# Patient Record
Sex: Female | Born: 1935 | Race: White | Hispanic: No | Smoking: Former smoker
Health system: Southern US, Community
[De-identification: ages and names within clinical notes are randomized; demographics above are authoritative.]

## PROBLEM LIST (undated history)

## (undated) DIAGNOSIS — K219 Gastro-esophageal reflux disease without esophagitis: Secondary | ICD-10-CM

## (undated) DIAGNOSIS — Z974 Presence of external hearing-aid: Secondary | ICD-10-CM

## (undated) DIAGNOSIS — I10 Essential (primary) hypertension: Secondary | ICD-10-CM

## (undated) DIAGNOSIS — I2510 Atherosclerotic heart disease of native coronary artery without angina pectoris: Secondary | ICD-10-CM

## (undated) DIAGNOSIS — M549 Dorsalgia, unspecified: Secondary | ICD-10-CM

## (undated) DIAGNOSIS — E7800 Pure hypercholesterolemia, unspecified: Secondary | ICD-10-CM

## (undated) DIAGNOSIS — I509 Heart failure, unspecified: Secondary | ICD-10-CM

## (undated) DIAGNOSIS — G459 Transient cerebral ischemic attack, unspecified: Secondary | ICD-10-CM

## (undated) HISTORY — PX: CHOLECYSTECTOMY: SHX55

## (undated) HISTORY — PX: APPENDECTOMY: SHX54

## (undated) HISTORY — PX: TONSILLECTOMY: SUR1361

## (undated) HISTORY — PX: BREAST CYST ASPIRATION: SHX578

---

## 1993-01-13 HISTORY — PX: OTHER SURGICAL HISTORY: SHX169

## 1996-01-14 HISTORY — PX: ABDOMINAL HYSTERECTOMY: SHX81

## 1997-11-15 ENCOUNTER — Ambulatory Visit (HOSPITAL_COMMUNITY): Admission: RE | Admit: 1997-11-15 | Discharge: 1997-11-15 | Payer: Self-pay | Admitting: Internal Medicine

## 1999-04-23 ENCOUNTER — Encounter: Payer: Self-pay | Admitting: Internal Medicine

## 1999-04-23 ENCOUNTER — Encounter: Admission: RE | Admit: 1999-04-23 | Discharge: 1999-04-23 | Payer: Self-pay | Admitting: Internal Medicine

## 1999-05-07 ENCOUNTER — Ambulatory Visit (HOSPITAL_COMMUNITY): Admission: RE | Admit: 1999-05-07 | Discharge: 1999-05-07 | Payer: Self-pay | Admitting: Internal Medicine

## 1999-07-18 ENCOUNTER — Ambulatory Visit (HOSPITAL_COMMUNITY): Admission: RE | Admit: 1999-07-18 | Discharge: 1999-07-18 | Payer: Self-pay | Admitting: Gastroenterology

## 1999-08-14 ENCOUNTER — Ambulatory Visit (HOSPITAL_COMMUNITY): Admission: RE | Admit: 1999-08-14 | Discharge: 1999-08-14 | Payer: Self-pay | Admitting: Gastroenterology

## 1999-08-14 ENCOUNTER — Encounter: Payer: Self-pay | Admitting: Gastroenterology

## 2000-04-27 ENCOUNTER — Encounter: Payer: Self-pay | Admitting: Internal Medicine

## 2000-04-27 ENCOUNTER — Encounter: Admission: RE | Admit: 2000-04-27 | Discharge: 2000-04-27 | Payer: Self-pay | Admitting: Internal Medicine

## 2000-12-01 ENCOUNTER — Ambulatory Visit (HOSPITAL_COMMUNITY): Admission: RE | Admit: 2000-12-01 | Discharge: 2000-12-01 | Payer: Self-pay | Admitting: Internal Medicine

## 2001-04-28 ENCOUNTER — Encounter: Payer: Self-pay | Admitting: Internal Medicine

## 2001-04-28 ENCOUNTER — Encounter: Admission: RE | Admit: 2001-04-28 | Discharge: 2001-04-28 | Payer: Self-pay | Admitting: Internal Medicine

## 2002-05-03 ENCOUNTER — Encounter: Admission: RE | Admit: 2002-05-03 | Discharge: 2002-05-03 | Payer: Self-pay | Admitting: Internal Medicine

## 2002-05-03 ENCOUNTER — Encounter: Payer: Self-pay | Admitting: Internal Medicine

## 2003-05-04 ENCOUNTER — Encounter: Admission: RE | Admit: 2003-05-04 | Discharge: 2003-05-04 | Payer: Self-pay | Admitting: Internal Medicine

## 2003-08-19 ENCOUNTER — Emergency Department (HOSPITAL_COMMUNITY): Admission: EM | Admit: 2003-08-19 | Discharge: 2003-08-19 | Payer: Self-pay | Admitting: Emergency Medicine

## 2003-08-28 ENCOUNTER — Emergency Department (HOSPITAL_COMMUNITY): Admission: EM | Admit: 2003-08-28 | Discharge: 2003-08-28 | Payer: Self-pay | Admitting: Emergency Medicine

## 2003-08-31 ENCOUNTER — Emergency Department (HOSPITAL_COMMUNITY): Admission: EM | Admit: 2003-08-31 | Discharge: 2003-08-31 | Payer: Self-pay | Admitting: Emergency Medicine

## 2003-09-08 ENCOUNTER — Encounter: Admission: RE | Admit: 2003-09-08 | Discharge: 2003-09-08 | Payer: Self-pay | Admitting: Otolaryngology

## 2003-09-19 ENCOUNTER — Other Ambulatory Visit: Admission: RE | Admit: 2003-09-19 | Discharge: 2003-09-19 | Payer: Self-pay | Admitting: Otolaryngology

## 2004-05-07 ENCOUNTER — Encounter: Admission: RE | Admit: 2004-05-07 | Discharge: 2004-05-07 | Payer: Self-pay | Admitting: Internal Medicine

## 2004-08-13 ENCOUNTER — Encounter: Admission: RE | Admit: 2004-08-13 | Discharge: 2004-08-13 | Payer: Self-pay | Admitting: Internal Medicine

## 2005-05-13 ENCOUNTER — Encounter: Admission: RE | Admit: 2005-05-13 | Discharge: 2005-05-13 | Payer: Self-pay | Admitting: Internal Medicine

## 2005-08-20 ENCOUNTER — Ambulatory Visit (HOSPITAL_COMMUNITY): Admission: RE | Admit: 2005-08-20 | Discharge: 2005-08-20 | Payer: Self-pay | Admitting: Gastroenterology

## 2006-06-11 ENCOUNTER — Ambulatory Visit: Payer: Self-pay | Admitting: Internal Medicine

## 2007-05-04 ENCOUNTER — Ambulatory Visit: Payer: Self-pay | Admitting: Internal Medicine

## 2007-06-14 ENCOUNTER — Ambulatory Visit: Payer: Self-pay | Admitting: Internal Medicine

## 2007-07-07 ENCOUNTER — Ambulatory Visit: Payer: Self-pay | Admitting: Internal Medicine

## 2008-06-14 ENCOUNTER — Ambulatory Visit: Payer: Self-pay | Admitting: Internal Medicine

## 2008-10-26 ENCOUNTER — Emergency Department: Payer: Self-pay | Admitting: Emergency Medicine

## 2009-01-05 ENCOUNTER — Emergency Department: Payer: Self-pay | Admitting: Emergency Medicine

## 2009-06-20 ENCOUNTER — Ambulatory Visit: Payer: Self-pay | Admitting: Internal Medicine

## 2010-02-13 ENCOUNTER — Ambulatory Visit: Payer: Self-pay | Admitting: Internal Medicine

## 2010-02-15 HISTORY — PX: CORONARY ARTERY BYPASS GRAFT: SHX141

## 2010-02-22 ENCOUNTER — Encounter: Payer: Self-pay | Admitting: Internal Medicine

## 2010-03-14 ENCOUNTER — Encounter: Payer: Self-pay | Admitting: Internal Medicine

## 2010-04-08 ENCOUNTER — Encounter: Payer: Self-pay | Admitting: Internal Medicine

## 2010-04-14 ENCOUNTER — Encounter: Payer: Self-pay | Admitting: Internal Medicine

## 2010-05-14 ENCOUNTER — Encounter: Payer: Self-pay | Admitting: Internal Medicine

## 2010-06-14 ENCOUNTER — Encounter: Payer: Self-pay | Admitting: Internal Medicine

## 2010-08-14 ENCOUNTER — Ambulatory Visit: Payer: Self-pay | Admitting: Internal Medicine

## 2011-01-16 ENCOUNTER — Emergency Department: Payer: Self-pay | Admitting: Emergency Medicine

## 2011-01-16 LAB — BASIC METABOLIC PANEL
Chloride: 107 mmol/L (ref 98–107)
Co2: 24 mmol/L (ref 21–32)
Creatinine: 0.86 mg/dL (ref 0.60–1.30)
Glucose: 137 mg/dL — ABNORMAL HIGH (ref 65–99)
Potassium: 3.8 mmol/L (ref 3.5–5.1)

## 2011-01-16 LAB — CBC
HCT: 38.7 % (ref 35.0–47.0)
MCH: 31.5 pg (ref 26.0–34.0)
MCHC: 33.5 g/dL (ref 32.0–36.0)
RDW: 15.2 % — ABNORMAL HIGH (ref 11.5–14.5)

## 2011-01-16 LAB — URINALYSIS, COMPLETE
Blood: NEGATIVE
Nitrite: NEGATIVE
Ph: 6 (ref 4.5–8.0)
Protein: NEGATIVE
Specific Gravity: 1.004 (ref 1.003–1.030)

## 2011-01-16 LAB — TROPONIN I
Troponin-I: <0.02 ng/mL
Troponin-I: <0.02 ng/mL

## 2011-06-11 ENCOUNTER — Ambulatory Visit: Payer: Self-pay | Admitting: Unknown Physician Specialty

## 2011-09-01 ENCOUNTER — Ambulatory Visit: Payer: Self-pay | Admitting: Otolaryngology

## 2011-10-14 ENCOUNTER — Ambulatory Visit: Payer: Self-pay | Admitting: Internal Medicine

## 2012-10-22 ENCOUNTER — Ambulatory Visit: Payer: Self-pay | Admitting: Internal Medicine

## 2013-03-23 ENCOUNTER — Emergency Department: Payer: Self-pay | Admitting: Emergency Medicine

## 2013-03-23 LAB — COMPREHENSIVE METABOLIC PANEL
ALBUMIN: 3.8 g/dL (ref 3.4–5.0)
ALK PHOS: 79 U/L
ANION GAP: 7 (ref 7–16)
AST: 30 U/L (ref 15–37)
BUN: 16 mg/dL (ref 7–18)
Bilirubin,Total: 1.2 mg/dL — ABNORMAL HIGH (ref 0.2–1.0)
CALCIUM: 9.4 mg/dL (ref 8.5–10.1)
CREATININE: 1.04 mg/dL (ref 0.60–1.30)
Chloride: 106 mmol/L (ref 98–107)
Co2: 25 mmol/L (ref 21–32)
EGFR (African American): 57 — ABNORMAL LOW
GFR CALC NON AF AMER: 49 — AB
GLUCOSE: 197 mg/dL — AB (ref 65–99)
Osmolality: 282 (ref 275–301)
POTASSIUM: 4 mmol/L (ref 3.5–5.1)
SGPT (ALT): 28 U/L (ref 12–78)
Sodium: 138 mmol/L (ref 136–145)
Total Protein: 7.1 g/dL (ref 6.4–8.2)

## 2013-03-23 LAB — CBC
HCT: 40.3 % (ref 35.0–47.0)
HGB: 13.7 g/dL (ref 12.0–16.0)
MCH: 31.6 pg (ref 26.0–34.0)
MCHC: 34.1 g/dL (ref 32.0–36.0)
MCV: 93 fL (ref 80–100)
Platelet: 234 10*3/uL (ref 150–440)
RBC: 4.34 10*6/uL (ref 3.80–5.20)
RDW: 14.3 % (ref 11.5–14.5)
WBC: 7.8 10*3/uL (ref 3.6–11.0)

## 2013-03-23 LAB — CK TOTAL AND CKMB (NOT AT ARMC)
CK, TOTAL: 100 U/L
CK-MB: 2.6 ng/mL (ref 0.5–3.6)

## 2013-03-23 LAB — TROPONIN I: Troponin-I: <0.02 ng/mL

## 2013-03-23 LAB — URINALYSIS, COMPLETE
Bilirubin,UR: NEGATIVE
Blood: NEGATIVE
GLUCOSE, UR: NEGATIVE mg/dL (ref 0–75)
Hyaline Cast: 6
LEUKOCYTE ESTERASE: NEGATIVE
NITRITE: NEGATIVE
PH: 7 (ref 4.5–8.0)
PROTEIN: NEGATIVE
RBC,UR: 2 /HPF (ref 0–5)
SPECIFIC GRAVITY: 1.015 (ref 1.003–1.030)
WBC UR: 3 /HPF (ref 0–5)

## 2013-03-28 LAB — CULTURE, BLOOD (SINGLE)

## 2013-03-29 DIAGNOSIS — I2510 Atherosclerotic heart disease of native coronary artery without angina pectoris: Secondary | ICD-10-CM | POA: Insufficient documentation

## 2013-03-29 DIAGNOSIS — E782 Mixed hyperlipidemia: Secondary | ICD-10-CM | POA: Insufficient documentation

## 2013-09-19 ENCOUNTER — Emergency Department: Payer: Self-pay | Admitting: Emergency Medicine

## 2013-09-19 LAB — CBC WITH DIFFERENTIAL/PLATELET
BASOS ABS: 0.3 10*3/uL — AB (ref 0.0–0.1)
Basophil %: 3.2 %
EOS ABS: 0.1 10*3/uL (ref 0.0–0.7)
EOS PCT: 0.7 %
HCT: 41.9 % (ref 35.0–47.0)
HGB: 13.7 g/dL (ref 12.0–16.0)
Lymphocyte #: 0.8 10*3/uL — ABNORMAL LOW (ref 1.0–3.6)
Lymphocyte %: 7.4 %
MCH: 30.6 pg (ref 26.0–34.0)
MCHC: 32.7 g/dL (ref 32.0–36.0)
MCV: 94 fL (ref 80–100)
MONO ABS: 0.5 x10 3/mm (ref 0.2–0.9)
Monocyte %: 5 %
NEUTROS ABS: 8.8 10*3/uL — AB (ref 1.4–6.5)
NEUTROS PCT: 83.7 %
Platelet: 224 10*3/uL (ref 150–440)
RBC: 4.48 10*6/uL (ref 3.80–5.20)
RDW: 13.9 % (ref 11.5–14.5)
WBC: 10.5 10*3/uL (ref 3.6–11.0)

## 2013-09-19 LAB — URINALYSIS, COMPLETE
BLOOD: NEGATIVE
Bilirubin,UR: NEGATIVE
GLUCOSE, UR: NEGATIVE mg/dL (ref 0–75)
KETONE: NEGATIVE
Leukocyte Esterase: NEGATIVE
Nitrite: NEGATIVE
PROTEIN: NEGATIVE
Ph: 5 (ref 4.5–8.0)
RBC,UR: 1 /HPF (ref 0–5)
SPECIFIC GRAVITY: 1.014 (ref 1.003–1.030)
Squamous Epithelial: 1
WBC UR: 1 /HPF (ref 0–5)

## 2013-09-19 LAB — COMPREHENSIVE METABOLIC PANEL
ALBUMIN: 3.7 g/dL (ref 3.4–5.0)
ALK PHOS: 79 U/L
ANION GAP: 6 — AB (ref 7–16)
BUN: 19 mg/dL — AB (ref 7–18)
Bilirubin,Total: 1.2 mg/dL — ABNORMAL HIGH (ref 0.2–1.0)
CALCIUM: 9.8 mg/dL (ref 8.5–10.1)
CHLORIDE: 106 mmol/L (ref 98–107)
CO2: 24 mmol/L (ref 21–32)
CREATININE: 0.85 mg/dL (ref 0.60–1.30)
EGFR (African American): >60
EGFR (Non-African Amer.): >60
Glucose: 189 mg/dL — ABNORMAL HIGH (ref 65–99)
Osmolality: 279 (ref 275–301)
POTASSIUM: 3.9 mmol/L (ref 3.5–5.1)
SGOT(AST): 16 U/L (ref 15–37)
SGPT (ALT): 23 U/L
Sodium: 136 mmol/L (ref 136–145)
TOTAL PROTEIN: 7 g/dL (ref 6.4–8.2)

## 2013-09-19 LAB — CLOSTRIDIUM DIFFICILE(ARMC)

## 2013-09-22 LAB — STOOL CULTURE

## 2013-11-16 ENCOUNTER — Ambulatory Visit: Payer: Self-pay | Admitting: Internal Medicine

## 2013-12-05 DIAGNOSIS — I340 Nonrheumatic mitral (valve) insufficiency: Secondary | ICD-10-CM | POA: Insufficient documentation

## 2013-12-05 DIAGNOSIS — I5032 Chronic diastolic (congestive) heart failure: Secondary | ICD-10-CM | POA: Insufficient documentation

## 2013-12-05 DIAGNOSIS — I5022 Chronic systolic (congestive) heart failure: Secondary | ICD-10-CM | POA: Insufficient documentation

## 2014-03-09 DIAGNOSIS — R1084 Generalized abdominal pain: Secondary | ICD-10-CM | POA: Insufficient documentation

## 2014-05-02 ENCOUNTER — Emergency Department
Admit: 2014-05-02 | Disposition: A | Discharge status: Home / Self Care | Payer: Self-pay | Admitting: Emergency Medicine

## 2014-05-02 LAB — COMPREHENSIVE METABOLIC PANEL
ALT: 18 U/L
AST: 23 U/L
Albumin: 4.2 g/dL
Alkaline Phosphatase: 72 U/L
Anion Gap: 12 (ref 7–16)
BUN: 16 mg/dL
Bilirubin,Total: 0.7 mg/dL
CALCIUM: 9.6 mg/dL
CO2: 21 mmol/L — AB
Chloride: 106 mmol/L
Creatinine: 0.82 mg/dL
Glucose: 141 mg/dL — ABNORMAL HIGH
Potassium: 3.7 mmol/L
Sodium: 139 mmol/L
TOTAL PROTEIN: 6.7 g/dL

## 2014-05-02 LAB — CBC WITH DIFFERENTIAL/PLATELET
BASOS PCT: 0.5 %
Basophil #: 0 10*3/uL (ref 0.0–0.1)
EOS ABS: 0.2 10*3/uL (ref 0.0–0.7)
EOS PCT: 2.4 %
HCT: 39 % (ref 35.0–47.0)
HGB: 13.2 g/dL (ref 12.0–16.0)
LYMPHS PCT: 36.5 %
Lymphocyte #: 2.8 10*3/uL (ref 1.0–3.6)
MCH: 31.2 pg (ref 26.0–34.0)
MCHC: 33.8 g/dL (ref 32.0–36.0)
MCV: 92 fL (ref 80–100)
MONOS PCT: 6.7 %
Monocyte #: 0.5 x10 3/mm (ref 0.2–0.9)
NEUTROS PCT: 53.9 %
Neutrophil #: 4.1 10*3/uL (ref 1.4–6.5)
Platelet: 210 10*3/uL (ref 150–440)
RBC: 4.22 10*6/uL (ref 3.80–5.20)
RDW: 13.8 % (ref 11.5–14.5)
WBC: 7.6 10*3/uL (ref 3.6–11.0)

## 2014-05-02 LAB — TSH: Thyroid Stimulating Horm: 6.329 u[IU]/mL — ABNORMAL HIGH

## 2014-05-02 LAB — TROPONIN I: Troponin-I: <0.03 ng/mL

## 2014-06-07 DIAGNOSIS — I071 Rheumatic tricuspid insufficiency: Secondary | ICD-10-CM | POA: Insufficient documentation

## 2014-06-08 ENCOUNTER — Other Ambulatory Visit: Payer: Self-pay | Admitting: Gastroenterology

## 2014-06-08 DIAGNOSIS — R1084 Generalized abdominal pain: Secondary | ICD-10-CM

## 2014-06-15 ENCOUNTER — Ambulatory Visit
Admission: RE | Admit: 2014-06-15 | Discharge: 2014-06-15 | Disposition: A | Discharge status: Home / Self Care | Payer: Medicare Other | Source: Ambulatory Visit | Attending: Gastroenterology | Admitting: Gastroenterology

## 2014-06-15 DIAGNOSIS — R140 Abdominal distension (gaseous): Secondary | ICD-10-CM | POA: Insufficient documentation

## 2014-06-15 DIAGNOSIS — I700 Atherosclerosis of aorta: Secondary | ICD-10-CM | POA: Insufficient documentation

## 2014-06-15 DIAGNOSIS — R1084 Generalized abdominal pain: Secondary | ICD-10-CM | POA: Diagnosis not present

## 2014-06-16 DIAGNOSIS — I10 Essential (primary) hypertension: Secondary | ICD-10-CM | POA: Insufficient documentation

## 2014-09-08 DIAGNOSIS — K219 Gastro-esophageal reflux disease without esophagitis: Secondary | ICD-10-CM | POA: Insufficient documentation

## 2014-09-08 DIAGNOSIS — Z8639 Personal history of other endocrine, nutritional and metabolic disease: Secondary | ICD-10-CM | POA: Insufficient documentation

## 2014-09-12 DIAGNOSIS — M5416 Radiculopathy, lumbar region: Secondary | ICD-10-CM | POA: Insufficient documentation

## 2014-09-12 DIAGNOSIS — M5136 Other intervertebral disc degeneration, lumbar region: Secondary | ICD-10-CM | POA: Insufficient documentation

## 2014-09-12 DIAGNOSIS — M48062 Spinal stenosis, lumbar region with neurogenic claudication: Secondary | ICD-10-CM | POA: Insufficient documentation

## 2014-09-12 DIAGNOSIS — M51369 Other intervertebral disc degeneration, lumbar region without mention of lumbar back pain or lower extremity pain: Secondary | ICD-10-CM | POA: Insufficient documentation

## 2015-03-21 ENCOUNTER — Other Ambulatory Visit: Payer: Self-pay | Admitting: Internal Medicine

## 2015-03-21 DIAGNOSIS — Z1231 Encounter for screening mammogram for malignant neoplasm of breast: Secondary | ICD-10-CM

## 2015-03-29 ENCOUNTER — Ambulatory Visit
Admission: RE | Admit: 2015-03-29 | Discharge: 2015-03-29 | Disposition: A | Discharge status: Home / Self Care | Payer: Medicare Other | Source: Ambulatory Visit | Attending: Internal Medicine | Admitting: Internal Medicine

## 2015-03-29 ENCOUNTER — Other Ambulatory Visit: Payer: Self-pay | Admitting: Internal Medicine

## 2015-03-29 DIAGNOSIS — Z1231 Encounter for screening mammogram for malignant neoplasm of breast: Secondary | ICD-10-CM | POA: Insufficient documentation

## 2015-05-10 ENCOUNTER — Other Ambulatory Visit: Payer: Self-pay | Admitting: Otolaryngology

## 2015-05-10 DIAGNOSIS — E041 Nontoxic single thyroid nodule: Secondary | ICD-10-CM

## 2015-08-13 DIAGNOSIS — G5601 Carpal tunnel syndrome, right upper limb: Secondary | ICD-10-CM | POA: Insufficient documentation

## 2015-09-04 ENCOUNTER — Emergency Department
Admission: EM | Admit: 2015-09-04 | Discharge: 2015-09-04 | Disposition: A | Discharge status: Home / Self Care | Payer: Medicare Other | Attending: Emergency Medicine | Admitting: Emergency Medicine

## 2015-09-04 ENCOUNTER — Encounter: Payer: Self-pay | Admitting: Medical Oncology

## 2015-09-04 ENCOUNTER — Emergency Department: Payer: Medicare Other

## 2015-09-04 DIAGNOSIS — K222 Esophageal obstruction: Secondary | ICD-10-CM | POA: Insufficient documentation

## 2015-09-04 DIAGNOSIS — I10 Essential (primary) hypertension: Secondary | ICD-10-CM | POA: Insufficient documentation

## 2015-09-04 HISTORY — DX: Pure hypercholesterolemia, unspecified: E78.00

## 2015-09-04 HISTORY — DX: Gastro-esophageal reflux disease without esophagitis: K21.9

## 2015-09-04 HISTORY — DX: Essential (primary) hypertension: I10

## 2015-09-04 LAB — COMPREHENSIVE METABOLIC PANEL
ALT: 18 U/L (ref 14–54)
ANION GAP: 5 (ref 5–15)
AST: 19 U/L (ref 15–41)
Albumin: 3.9 g/dL (ref 3.5–5.0)
Alkaline Phosphatase: 63 U/L (ref 38–126)
BUN: 16 mg/dL (ref 6–20)
CALCIUM: 9.6 mg/dL (ref 8.9–10.3)
CHLORIDE: 111 mmol/L (ref 101–111)
CO2: 22 mmol/L (ref 22–32)
Creatinine, Ser: 0.65 mg/dL (ref 0.44–1.00)
Glucose, Bld: 119 mg/dL — ABNORMAL HIGH (ref 65–99)
POTASSIUM: 3.9 mmol/L (ref 3.5–5.1)
SODIUM: 138 mmol/L (ref 135–145)
TOTAL PROTEIN: 6.8 g/dL (ref 6.5–8.1)
Total Bilirubin: 1.4 mg/dL — ABNORMAL HIGH (ref 0.3–1.2)

## 2015-09-04 LAB — CBC
HCT: 38.3 % (ref 35.0–47.0)
HEMOGLOBIN: 13.2 g/dL (ref 12.0–16.0)
MCH: 31.9 pg (ref 26.0–34.0)
MCHC: 34.5 g/dL (ref 32.0–36.0)
MCV: 92.4 fL (ref 80.0–100.0)
PLATELETS: 191 10*3/uL (ref 150–440)
RBC: 4.15 MIL/uL (ref 3.80–5.20)
RDW: 14.1 % (ref 11.5–14.5)
WBC: 6 10*3/uL (ref 3.6–11.0)

## 2015-09-04 LAB — LIPASE, BLOOD: LIPASE: 23 U/L (ref 11–51)

## 2015-09-04 NOTE — ED Provider Notes (Signed)
[facility] Emergency Department Provider Note  ____________________________________________   First MD Initiated Contact with Patient 09/04/15 1737     (approximate)  I have reviewed the triage vital signs and the nursing notes.   HISTORY  Chief Complaint Abdominal Pain and Airway Obstruction    HPI Megan Salinas is a 82 y.o. female with a past medical history that includes acid reflux disease who presents after having what sounds like an esophageal obstruction last night.  She was at dinner and suddenly felt like "my hiatal hernia was blocking my esophagus".  She had multiple episodes of emesis during the night, although she states that some of them were self-induced because she thought it might make her feel better. She continued to have the sensation of something obstructing her esophagus. All of this completely resolved at 11 AM when she could feel everything relax. Since that time she has eaten a meal and taken all of her medications have been able to drink liquids without any difficulty.  She describes the symptoms of severe while they are occurring but completely resolved at this time.  Nothing in particular made it better nor worse. She denies chest pain, shortness of breath, fever/chills, abdominal pain, dysuria.  She is frustrated because she has had numerous  eepisodes like this over many years and hher daughter is with her today as well because of their concern that this "just keeps happening".  She has apparently spoken with her primary care doctor about this in the past but has not seen a G.I. Doctor recently. She did see Dr. Renne Musca in the past but it does not sound as if she had an endoscopy.   Past Medical History:  Diagnosis Date  . GERD (gastroesophageal reflux disease)   . High cholesterol   . Hypertension     There are no active problems to display for this patient.   Past Surgical History:  Procedure Laterality Date  . BREAST  CYST ASPIRATION Right   . Cardiac bypass      Prior to Admission medications   Not on File    Allergies Sulfa antibiotics  Family History  Problem Relation Age of Onset  . Breast cancer Paternal Aunt     80's    Social History Social History  Substance Use Topics  . Smoking status: Not on file  . Smokeless tobacco: Not on file  . Alcohol use Not on file    Review of Systems Constitutional: No fever/chills Eyes: No visual changes. ENT: No sore throat. Cardiovascular: Denies chest pain. Respiratory: Denies shortness of breath. Gastrointestinal: No abdominal pain.  No nausea, no vomiting.  No diarrhea.  No constipation. Genitourinary: Negative for dysuria. Musculoskeletal: Negative for back pain. Skin: Negative for rash. Neurological: Negative for headaches, focal weakness or numbness.  10-point ROS otherwise negative.  ____________________________________________   PHYSICAL EXAM:  VITAL SIGNS: ED Triage Vitals  Enc Vitals Group     BP 09/04/15 1527 (!) 143/73     Pulse Rate 09/04/15 1527 77     Resp 09/04/15 1527 18     Temp 09/04/15 1527 98.4 F (36.9 C)     Temp Source 09/04/15 1527 Oral     SpO2 09/04/15 1527 98 %     Weight 09/04/15 1527 170 lb (77.1 kg)     Height 09/04/15 1527 5\' 4"  (1.626 m)     Head Circumference --      Peak Flow --      Pain  Score 09/04/15 1729 0     Pain Loc --      Pain Edu? --      Excl. in [place]? --     Constitutional: Alert and oriented. Well appearing and in no acute distress. Eyes: Conjunctivae are normal. PERRL. EOMI. Head: Atraumatic. Nose: No congestion/rhinnorhea. Mouth/Throat: Mucous membranes are moist.  Oropharynx non-erythematous. Neck: No stridor.  No meningeal signs.  No carotid bruit Cardiovascular: Normal rate, regular rhythm. Good peripheral circulation. Grossly normal heart sounds. Respiratory: Normal respiratory effort.  No retractions. Lungs CTAB. Gastrointestinal: Soft and nontender. No distention.    Musculoskeletal: No lower extremity tenderness nor edema. No gross deformities of extremities. Neurologic:  Normal speech and language. No gross focal neurologic deficits are appreciated.  Skin:  Skin is warm, dry and intact. No rash noted. Psychiatric: Mood and affect are normal. Speech and behavior are normal.  ____________________________________________   LABS (all labs ordered are listed, but only abnormal results are displayed)  Labs Reviewed  COMPREHENSIVE METABOLIC PANEL - Abnormal; Notable for the following:       Result Value   Glucose, Bld 119 (*)    Total Bilirubin 1.4 (*)    All other components within normal limits  LIPASE, BLOOD  CBC   ____________________________________________  EKG  None ordered by ED physician ____________________________________________  RADIOLOGY   Dg Chest 2 View  Result Date: 09/04/2015 CLINICAL DATA:  Choking EXAM: CHEST  2 VIEW COMPARISON:  05/02/2014 FINDINGS: Postop CABG. Mild cardiac enlargement. Negative for heart failure. Blunting of the costophrenic angle on the left is unchanged may be due to pleural scarring or small effusion. Small right effusion. Negative for pneumonia. IMPRESSION: Mild pleural scarring versus small pleural effusion bilaterally. Negative for pulmonary edema. Electronically Signed   By: Franchot Gallo M.D.   On: 09/04/2015 16:02    ____________________________________________   PROCEDURES  Procedure(s) performed:   Procedures   Critical Care performed: No ____________________________________________   INITIAL IMPRESSION / ASSESSMENT AND PLAN / ED COURSE  Pertinent labs & imaging results that were available during my care of the patient were reviewed by me and considered in my medical decision making (see chart for details).  I explained to the patient that it sounds like she had an esophageal obstruction and that she may suffer from a structure, but that there is nothing emergent for me to treat  at this time, and that [facility] doctor will not emergently take her to the endoscopy suite  When she is not currently having symptoms. I encouraged her to follow up with her PCP for a referral, and I also gave her the name and number for Dr. Allen Norris bbut explained that his clinic is very busy and that it may be some time before he can see her.  I encouraged her to work with Dr. Gilford Rile (her PCP) about possibly referring her to someone else in a different local community since she is able to travel and has assistance of her daughter. I encouraged her to stick to soft foods and give her my usual and customary return precautions.. She and her daughter understand and agree with the plan.   Clinical Course    ____________________________________________  FINAL CLINICAL IMPRESSION(S) / ED DIAGNOSES  Final diagnoses:  Obstruction of esophagus     MEDICATIONS GIVEN DURING THIS VISIT:  Medications - No data to display   NEW OUTPATIENT MEDICATIONS STARTED DURING THIS VISIT:  There are no discharge medications for this patient.     Note:  This document was prepared using Dragon voice recognition software and may include unintentional dictation errors.    Hinda Kehr, MD 09/04/15 [phone]

## 2015-09-04 NOTE — ED Triage Notes (Signed)
Pt reports last night while she was eating dinner something was stuck in her throat and pt felt this way until noon today. Pt states that since last night she vomited back up everything she tried to swallow. Pt now states that he stomach just feels "not right".

## 2015-09-04 NOTE — ED Notes (Signed)
Pt. Verbalizes understanding of d/c instructions, and follow-up. VS stable and pain controlled per pt.  Pt. In NAD at time of d/c and denies further concerns regarding this visit. Pt.ambulatory Out of the unit with steady gait per baseline with cane with daughter at the side. Pt in good spirits at this time. Pt advised to return to the ED at any time for emergent concerns, or for new/worsening symptoms.

## 2015-09-04 NOTE — Discharge Instructions (Signed)
As we discussed, it sounds as if you may suffer from an esophageal stricture and occasionally develop an acute obstruction.  This likely happened last night, possibly due to food impaction.  Fortunately it has resolved and you are able to eat and drink and take your medications once again.  We encourage you to take as many precautions as possible to avoid additional impactions, such as chewing her food thoroughly, trying to adhere to a soft diet, crushing up tablets or other medications that you do not need to swallow whole, etc.  In particular you should avoid steak and other thick and tough meats that may cause acute obstruction.  Follow-up with your primary care doctor at the next available opportunity to see if he can refer you to a GI doctor.  Alternatively, you may be able to call the office of Dr. Allen Norris and schedule an appointment directly, but his office is very busy and sometimes they require referral from a PCP.  Return immediately to the emergency department if he develop new or worsening symptoms that concern you.

## 2015-09-06 ENCOUNTER — Telehealth: Payer: Self-pay | Admitting: Gastroenterology

## 2015-09-06 NOTE — Telephone Encounter (Signed)
Went to ER Tuesday with her throat closing up and referred to Dr. Allen Norris. Do we need an appointment or procedure?

## 2015-09-06 NOTE — Telephone Encounter (Signed)
I have not seen this patient in the past and she has been followed by the clinical clinic GI team in the past.  If the patient is having trouble swallowing and would like to follow up with [place] then I would recommend getting an upper GI barium swallow to see if there is any problems with her esophagus.

## 2015-09-06 NOTE — Telephone Encounter (Signed)
Pt is 89. Should we just schedule an office visit?

## 2015-09-07 ENCOUNTER — Other Ambulatory Visit: Payer: Self-pay

## 2015-09-07 ENCOUNTER — Telehealth: Payer: Self-pay | Admitting: Gastroenterology

## 2015-09-07 DIAGNOSIS — R1310 Dysphagia, unspecified: Secondary | ICD-10-CM

## 2015-09-07 NOTE — Telephone Encounter (Signed)
Patient will need to reschedule. The day you gave [place] for the procedure [place]

## 2015-09-07 NOTE — Telephone Encounter (Signed)
Barium swallow has been scheduled at [facility] on Friday, Sept 8th @ 10:00am. Pt has been advised to be NPO 6 hrs prior and to arrive at 9:45am [place] medical mall.

## 2015-09-07 NOTE — Telephone Encounter (Signed)
Patient stated that her doctor has left the [facility] practice and would like to start with Dr. Allen Norris. Fridays are good for her.  [phone]

## 2015-09-07 NOTE — Telephone Encounter (Signed)
Pt has been rescheduled for the barium swallow at [facility] on Friday, Sept 15th @10 :00am.

## 2015-09-07 NOTE — Telephone Encounter (Signed)
See Dr. Dorothey Baseman note below. She is already a pt of [facility].  If she wants to establish with [place], I'll order the Barium swallow.

## 2015-09-14 ENCOUNTER — Ambulatory Visit: Payer: Medicare Other

## 2015-09-21 ENCOUNTER — Ambulatory Visit: Payer: Medicare Other

## 2015-09-28 ENCOUNTER — Ambulatory Visit
Admission: RE | Admit: 2015-09-28 | Discharge: 2015-09-28 | Disposition: A | Discharge status: Home / Self Care | Payer: Medicare Other | Source: Ambulatory Visit | Attending: Gastroenterology | Admitting: Gastroenterology

## 2015-09-28 DIAGNOSIS — R1310 Dysphagia, unspecified: Secondary | ICD-10-CM | POA: Insufficient documentation

## 2015-09-28 DIAGNOSIS — K449 Diaphragmatic hernia without obstruction or gangrene: Secondary | ICD-10-CM | POA: Diagnosis not present

## 2015-09-28 DIAGNOSIS — I700 Atherosclerosis of aorta: Secondary | ICD-10-CM | POA: Diagnosis not present

## 2015-10-03 ENCOUNTER — Telehealth: Payer: Self-pay

## 2015-10-03 NOTE — Telephone Encounter (Signed)
Pt notified of barium swallow results.  

## 2015-10-03 NOTE — Telephone Encounter (Signed)
-----   Message from Lucilla Lame, MD sent at 10/02/2015 12:24 PM EDT ----- Let the patient know that the barium swallow showed a small hiatal hernia but the barium and the pills they gave her when rapidly passed to esophagus without any obstruction or narrowing seen.

## 2015-11-01 ENCOUNTER — Encounter: Payer: Self-pay | Admitting: Gastroenterology

## 2015-11-01 ENCOUNTER — Ambulatory Visit: Payer: Self-pay | Admitting: Gastroenterology

## 2015-11-01 ENCOUNTER — Other Ambulatory Visit: Payer: Self-pay

## 2015-11-01 ENCOUNTER — Ambulatory Visit (INDEPENDENT_AMBULATORY_CARE_PROVIDER_SITE_OTHER): Payer: Medicare Other | Admitting: Gastroenterology

## 2015-11-01 VITALS — BP 149/70 | HR 79 | Temp 98.1°F | Ht 64.0 in | Wt 174.0 lb

## 2015-11-01 DIAGNOSIS — K219 Gastro-esophageal reflux disease without esophagitis: Secondary | ICD-10-CM

## 2015-11-01 DIAGNOSIS — K5900 Constipation, unspecified: Secondary | ICD-10-CM | POA: Diagnosis not present

## 2015-11-01 MED ORDER — PANTOPRAZOLE SODIUM 40 MG PO TBEC
40.0000 mg | DELAYED_RELEASE_TABLET | Freq: Two times a day (BID) | ORAL | 11 refills | Status: DC
Start: 2015-11-01 — End: 2019-04-13

## 2015-11-01 NOTE — Progress Notes (Signed)
Gastroenterology Consultation  Referring Provider:     Madelyn Brunner, MD Primary Care Physician:  Madelyn Brunner, MD Primary Gastroenterologist:  Dr. Allen Norris     Reason for Consultation:     Constipation and GERD.        HPI:   Megan Salinas is a 82 y.o. y/o female referred for consultation & management of This patient and GERD by Dr. Sarina Ser, Hewitt Blade, MD.  This patient comes today with a long history of GI problems. The patient states she has a lot of abdominal bloating and abdominal pain. The patient also states that she has bowel movements which after moving her bowels she reports that she feels like she has incomplete evacuation. The patient also reports that she has had acid breakthrough approximately every 3 days on her omeprazole twice a day. The patient was switched at the beginning of the month to pantoprazole twice a day but did not get the prescription filled. The patient also reports that she has tried fiber the past but she does not think she gave it long enough to work. The patient also states that when she takes the full dose of MiraLAX every day she starts to have diarrhea. The patient had an upper GI that showed a hiatal hernia and decreased esophageal motility.  Past Medical History:  Diagnosis Date  . GERD (gastroesophageal reflux disease)   . High cholesterol   . Hypertension     Past Surgical History:  Procedure Laterality Date  . BREAST CYST ASPIRATION Right   . Cardiac bypass      Prior to Admission medications   Medication Sig Start Date End Date Taking? Authorizing Provider  amitriptyline (ELAVIL) 25 MG tablet TAKE ONE TABLET BY MOUTH AT BEDTIME AS DIRECTED 09/12/15  Yes Historical Provider, MD  aspirin EC 81 MG tablet Take by mouth.   Yes Historical Provider, MD  lisinopril (PRINIVIL,ZESTRIL) 10 MG tablet TAKE 1 TABLET BY MOUTH ONCE DAILY 10/02/15  Yes Historical Provider, MD  lisinopril (PRINIVIL,ZESTRIL) 10 MG tablet  10/02/15  Yes Historical  Provider, MD  metoprolol tartrate (LOPRESSOR) 25 MG tablet TAKE 1/2 TABLET BY MOUTH TWICE DAILY 06/26/15  Yes Historical Provider, MD  Multiple Vitamin (MULTI-VITAMINS) TABS Take by mouth.   Yes Historical Provider, MD  nystatin cream (MYCOSTATIN) APPLY TO THE AFFECTED AREA TWICE DAILY 09/10/15  Yes Historical Provider, MD  nystatin cream (MYCOSTATIN)  09/10/15  Yes Historical Provider, MD  pantoprazole (PROTONIX) 40 MG tablet TK 1 T PO BID 30 MINUTES PRIOR TO MEALS 10/14/15  Yes Historical Provider, MD  pantoprazole (PROTONIX) 40 MG tablet  10/14/15  Yes Historical Provider, MD  ranitidine (ZANTAC) 150 MG tablet TAKE 1 TABLET BY MOUTH NIGHTLY 10/29/15  Yes Historical Provider, MD  ranitidine (ZANTAC) 150 MG tablet  10/29/15  Yes Historical Provider, MD  simvastatin (ZOCOR) 40 MG tablet TAKE 1 TABLET BY MOUTH EVERY DAY 05/28/15  Yes Historical Provider, MD  spironolactone (ALDACTONE) 25 MG tablet TAKE 1/2 TABLET BY MOUTH EVERY DAY 09/06/15  Yes Historical Provider, MD  furosemide (LASIX) 20 MG tablet TAKE 1 TABLET(20 MG) BY MOUTH EVERY DAY 06/13/15   Historical Provider, MD  lidocaine (XYLOCAINE) 2 % solution  10/16/15   Historical Provider, MD  lidocaine (XYLOCAINE) 2 % solution  10/16/15   Historical Provider, MD    Family History  Problem Relation Age of Onset  . Breast cancer Paternal Aunt     89's  Social History  Substance Use Topics  . Smoking status: Former Research scientist (life sciences)  . Smokeless tobacco: Never Used  . Alcohol use Yes     Comment: occasional glass of wine    Allergies as of 11/01/2015 - Review Complete 11/01/2015  Allergen Reaction Noted  . Sulfa antibiotics  09/04/2015    Review of Systems:    All systems reviewed and negative except where noted in HPI.   Physical Exam:  BP (!) 149/70   Pulse 79   Temp 98.1 F (36.7 C) (Oral)   Ht 5\' 4"  (1.626 m)   Wt 174 lb (78.9 kg)   BMI 29.87 kg/m  No LMP recorded. Patient is postmenopausal. Psych:  Alert and cooperative. Normal  mood and affect. General:   Alert,  Well-developed, well-nourished, pleasant and cooperative in NAD Head:  Normocephalic and atraumatic. Eyes:  Sclera clear, no icterus.   Conjunctiva pink. Ears:  Normal auditory acuity. Nose:  No deformity, discharge, or lesions. Mouth:  No deformity or lesions,oropharynx pink & moist. Neck:  Supple; no masses or thyromegaly. Lungs:  Respirations even and unlabored.  Clear throughout to auscultation.   No wheezes, crackles, or rhonchi. No acute distress. Heart:  Regular rate and rhythm; no murmurs, clicks, rubs, or gallops. Abdomen:  Normal bowel sounds.  No bruits.  Soft, non-tender and non-distended without masses, hepatosplenomegaly or hernias noted.  No guarding or rebound tenderness.  Negative Carnett sign.   Rectal:  Deferred.  Msk:  Symmetrical without gross deformities.  Good, equal movement & strength bilaterally. Pulses:  Normal pulses noted. Extremities:  No clubbing or edema.  No cyanosis. Neurologic:  Alert and oriented x3;  grossly normal neurologically. Skin:  Intact without significant lesions or rashes.  No jaundice. Lymph Nodes:  No significant cervical adenopathy. Psych:  Alert and cooperative. Normal mood and affect.  Imaging Studies: No results found.  Assessment and Plan:   Megan Salinas is a 82 y.o. y/o female who has constipation and GERD. The patient will be started on pantoprazole twice a day instead of her omeprazole because she is having acid breakthrough. The patient has also been told to start fiber twice a day and then add MiraLAX if she is not feeling like she is completely evacuating. The patient has been told by more frequent stooling she will have less bloating. The patient has been explained the plan and agrees with it.   Note: This dictation was prepared with Dragon dictation along with smaller phrase technology. Any transcriptional errors that result from this process are unintentional.

## 2015-11-06 ENCOUNTER — Ambulatory Visit
Admission: RE | Admit: 2015-11-06 | Discharge: 2015-11-06 | Disposition: A | Discharge status: Home / Self Care | Payer: Medicare Other | Source: Ambulatory Visit | Attending: Otolaryngology | Admitting: Otolaryngology

## 2015-11-06 DIAGNOSIS — E041 Nontoxic single thyroid nodule: Secondary | ICD-10-CM | POA: Diagnosis present

## 2015-11-06 DIAGNOSIS — E042 Nontoxic multinodular goiter: Secondary | ICD-10-CM | POA: Insufficient documentation

## 2016-01-14 DIAGNOSIS — G459 Transient cerebral ischemic attack, unspecified: Secondary | ICD-10-CM

## 2016-01-14 HISTORY — DX: Transient cerebral ischemic attack, unspecified: G45.9

## 2016-01-28 ENCOUNTER — Encounter: Payer: Self-pay | Admitting: Emergency Medicine

## 2016-01-28 ENCOUNTER — Emergency Department: Payer: Medicare Other

## 2016-01-28 ENCOUNTER — Observation Stay
Admission: EM | Admit: 2016-01-28 | Discharge: 2016-01-29 | Disposition: A | Discharge status: Home / Self Care | Payer: Medicare Other | Attending: Internal Medicine | Admitting: Internal Medicine

## 2016-01-28 DIAGNOSIS — H538 Other visual disturbances: Secondary | ICD-10-CM | POA: Diagnosis not present

## 2016-01-28 DIAGNOSIS — I2510 Atherosclerotic heart disease of native coronary artery without angina pectoris: Secondary | ICD-10-CM | POA: Insufficient documentation

## 2016-01-28 DIAGNOSIS — E782 Mixed hyperlipidemia: Secondary | ICD-10-CM | POA: Diagnosis not present

## 2016-01-28 DIAGNOSIS — Z66 Do not resuscitate: Secondary | ICD-10-CM | POA: Insufficient documentation

## 2016-01-28 DIAGNOSIS — I5022 Chronic systolic (congestive) heart failure: Secondary | ICD-10-CM | POA: Insufficient documentation

## 2016-01-28 DIAGNOSIS — I440 Atrioventricular block, first degree: Secondary | ICD-10-CM | POA: Diagnosis not present

## 2016-01-28 DIAGNOSIS — E119 Type 2 diabetes mellitus without complications: Secondary | ICD-10-CM | POA: Insufficient documentation

## 2016-01-28 DIAGNOSIS — R410 Disorientation, unspecified: Principal | ICD-10-CM | POA: Insufficient documentation

## 2016-01-28 DIAGNOSIS — G459 Transient cerebral ischemic attack, unspecified: Secondary | ICD-10-CM | POA: Diagnosis present

## 2016-01-28 DIAGNOSIS — I110 Hypertensive heart disease with heart failure: Secondary | ICD-10-CM | POA: Diagnosis not present

## 2016-01-28 DIAGNOSIS — Z951 Presence of aortocoronary bypass graft: Secondary | ICD-10-CM | POA: Insufficient documentation

## 2016-01-28 DIAGNOSIS — Z87891 Personal history of nicotine dependence: Secondary | ICD-10-CM | POA: Insufficient documentation

## 2016-01-28 DIAGNOSIS — Z9181 History of falling: Secondary | ICD-10-CM | POA: Diagnosis not present

## 2016-01-28 DIAGNOSIS — I071 Rheumatic tricuspid insufficiency: Secondary | ICD-10-CM | POA: Insufficient documentation

## 2016-01-28 DIAGNOSIS — Z7982 Long term (current) use of aspirin: Secondary | ICD-10-CM | POA: Insufficient documentation

## 2016-01-28 DIAGNOSIS — K219 Gastro-esophageal reflux disease without esophagitis: Secondary | ICD-10-CM | POA: Insufficient documentation

## 2016-01-28 DIAGNOSIS — R262 Difficulty in walking, not elsewhere classified: Secondary | ICD-10-CM

## 2016-01-28 DIAGNOSIS — Z79899 Other long term (current) drug therapy: Secondary | ICD-10-CM | POA: Diagnosis not present

## 2016-01-28 HISTORY — DX: Atherosclerotic heart disease of native coronary artery without angina pectoris: I25.10

## 2016-01-28 HISTORY — DX: Dorsalgia, unspecified: M54.9

## 2016-01-28 LAB — COMPREHENSIVE METABOLIC PANEL
ALBUMIN: 4.1 g/dL (ref 3.5–5.0)
ALT: 16 U/L (ref 14–54)
ANION GAP: 7 (ref 5–15)
AST: 24 U/L (ref 15–41)
Alkaline Phosphatase: 64 U/L (ref 38–126)
BUN: 13 mg/dL (ref 6–20)
CHLORIDE: 108 mmol/L (ref 101–111)
CO2: 22 mmol/L (ref 22–32)
Calcium: 9.7 mg/dL (ref 8.9–10.3)
Creatinine, Ser: 0.71 mg/dL (ref 0.44–1.00)
GFR calc non Af Amer: >60 mL/min (ref >60)
GLUCOSE: 123 mg/dL — AB (ref 65–99)
POTASSIUM: 4.1 mmol/L (ref 3.5–5.1)
SODIUM: 137 mmol/L (ref 135–145)
Total Bilirubin: 1 mg/dL (ref 0.3–1.2)
Total Protein: 7 g/dL (ref 6.5–8.1)

## 2016-01-28 LAB — CBC
HCT: 40.6 % (ref 35.0–47.0)
HEMOGLOBIN: 13.9 g/dL (ref 12.0–16.0)
MCH: 31.6 pg (ref 26.0–34.0)
MCHC: 34.3 g/dL (ref 32.0–36.0)
MCV: 92 fL (ref 80.0–100.0)
PLATELETS: 231 10*3/uL (ref 150–440)
RBC: 4.41 MIL/uL (ref 3.80–5.20)
RDW: 14.3 % (ref 11.5–14.5)
WBC: 6.8 10*3/uL (ref 3.6–11.0)

## 2016-01-28 LAB — TROPONIN I: Troponin I: <0.03 ng/mL (ref <0.03)

## 2016-01-28 LAB — GLUCOSE, CAPILLARY: GLUCOSE-CAPILLARY: 100 mg/dL — AB (ref 65–99)

## 2016-01-28 MED ORDER — SIMVASTATIN 20 MG PO TABS
40.0000 mg | ORAL_TABLET | Freq: Every day | ORAL | Status: DC
  Administered 2016-01-29: 10:00:00 40 mg via ORAL
  Filled 2016-01-28: qty 2

## 2016-01-28 MED ORDER — METOPROLOL TARTRATE 25 MG PO TABS
12.5000 mg | ORAL_TABLET | Freq: Two times a day (BID) | ORAL | Status: DC
  Administered 2016-01-28 – 2016-01-29 (×2): 12.5 mg via ORAL
  Filled 2016-01-28 (×2): qty 1

## 2016-01-28 MED ORDER — ASPIRIN EC 81 MG PO TBEC
81.0000 mg | DELAYED_RELEASE_TABLET | Freq: Every day | ORAL | Status: DC
  Administered 2016-01-29: 10:00:00 81 mg via ORAL
  Filled 2016-01-28: qty 1

## 2016-01-28 MED ORDER — ACETAMINOPHEN 325 MG PO TABS
650.0000 mg | ORAL_TABLET | Freq: Four times a day (QID) | ORAL | Status: DC | PRN
  Administered 2016-01-29: 03:00:00 650 mg via ORAL
  Filled 2016-01-28: qty 2

## 2016-01-28 MED ORDER — SODIUM CHLORIDE 0.9% FLUSH
3.0000 mL | Freq: Two times a day (BID) | INTRAVENOUS | Status: DC
  Administered 2016-01-29: 3 mL via INTRAVENOUS

## 2016-01-28 MED ORDER — SODIUM CHLORIDE 0.9 % IV SOLN: 250.0000 mL | INTRAVENOUS | Status: DC | PRN

## 2016-01-28 MED ORDER — PANTOPRAZOLE SODIUM 40 MG PO TBEC
40.0000 mg | DELAYED_RELEASE_TABLET | Freq: Two times a day (BID) | ORAL | Status: DC
  Administered 2016-01-29: 10:00:00 40 mg via ORAL
  Filled 2016-01-28: qty 1

## 2016-01-28 MED ORDER — SPIRONOLACTONE 25 MG PO TABS
12.5000 mg | ORAL_TABLET | Freq: Every day | ORAL | Status: DC
  Administered 2016-01-29: 10:00:00 12.5 mg via ORAL
  Filled 2016-01-28: qty 1

## 2016-01-28 MED ORDER — FAMOTIDINE 20 MG PO TABS
20.0000 mg | ORAL_TABLET | Freq: Two times a day (BID) | ORAL | Status: DC
  Administered 2016-01-28 – 2016-01-29 (×2): 20 mg via ORAL
  Filled 2016-01-28 (×2): qty 1

## 2016-01-28 MED ORDER — SODIUM CHLORIDE 0.9% FLUSH
3.0000 mL | INTRAVENOUS | Status: DC | PRN
Start: 2016-01-28 — End: 2016-01-29

## 2016-01-28 MED ORDER — LISINOPRIL 10 MG PO TABS
10.0000 mg | ORAL_TABLET | Freq: Every day | ORAL | Status: DC
  Administered 2016-01-29: 10:00:00 10 mg via ORAL
  Filled 2016-01-28: qty 1

## 2016-01-28 MED ORDER — ONDANSETRON HCL 4 MG/2ML IJ SOLN: 4.0000 mg | Freq: Four times a day (QID) | INTRAMUSCULAR | Status: DC | PRN

## 2016-01-28 MED ORDER — ONDANSETRON HCL 4 MG PO TABS: 4.0000 mg | ORAL_TABLET | Freq: Four times a day (QID) | ORAL | Status: DC | PRN

## 2016-01-28 MED ORDER — AMITRIPTYLINE HCL 25 MG PO TABS
25.0000 mg | ORAL_TABLET | Freq: Every evening | ORAL | Status: DC | PRN
Start: 2016-01-28 — End: 2016-01-29
  Administered 2016-01-28: 25 mg via ORAL
  Filled 2016-01-28: qty 1

## 2016-01-28 MED ORDER — ENOXAPARIN SODIUM 40 MG/0.4ML ~~LOC~~ SOLN
40.0000 mg | SUBCUTANEOUS | Status: DC
  Administered 2016-01-28: 40 mg via SUBCUTANEOUS
  Filled 2016-01-28: qty 0.4

## 2016-01-28 MED ORDER — ACETAMINOPHEN 650 MG RE SUPP: 650.0000 mg | Freq: Four times a day (QID) | RECTAL | Status: DC | PRN

## 2016-01-28 NOTE — H&P (Signed)
[facility] NAME: Megan Salinas    MR#:  [id number]  DATE OF BIRTH:  [birth year]  DATE OF ADMISSION:  01/28/2016  PRIMARY CARE PHYSICIAN: Madelyn Brunner, MD   REQUESTING/REFERRING PHYSICIAN: Nance Pear MD  CHIEF COMPLAINT:   Chief Complaint  Patient presents with  . Blurred Vision    HISTORY OF PRESENT ILLNESS: Megan Salinas  is a 83 y.o. female with a known history of Coronary artery disease, GERD, hyperlipidemia, essential hypertension who is presenting to the hospital with complaining of brief episode of confusion. Patient was on the phone with her daughter and thought that she was confused. Patient also had difficulty getting her words out this lasted for a few minutes. Patient also reports that she has a history of ocular migraines and was on the computer when her vision became blurred but this is nothing uncommon for her. Due to the symptoms she is brought to the ED CT scan of the head is negative. PAST MEDICAL HISTORY:   Past Medical History:  Diagnosis Date  . Back pain   . CAD (coronary artery disease)   . GERD (gastroesophageal reflux disease)   . High cholesterol   . Hypertension     PAST SURGICAL HISTORY:  Past Surgical History:  Procedure Laterality Date  . APPENDECTOMY    . baldder tac    . BREAST CYST ASPIRATION Right   . Cardiac bypass    . choley    . TONSILLECTOMY      SOCIAL HISTORY:  Social History  Substance Use Topics  . Smoking status: Former Research scientist (life sciences)  . Smokeless tobacco: Never Used  . Alcohol use Yes     Comment: occasional glass of wine    FAMILY HISTORY:  Family History  Problem Relation Age of Onset  . Breast cancer Paternal Aunt     49's  . Hypertension Mother     DRUG ALLERGIES:  Allergies  Allergen Reactions  . Sulfa Antibiotics     REVIEW OF SYSTEMS:   CONSTITUTIONAL: No fever, fatigue or weakness.  EYES: Positive intermittent blurred or double vision.  EARS, NOSE,  AND THROAT: No tinnitus or ear pain.  RESPIRATORY: No cough, shortness of breath, wheezing or hemoptysis.  CARDIOVASCULAR: No chest pain, orthopnea, edema.  GASTROINTESTINAL: No nausea, vomiting, diarrhea or abdominal pain.  GENITOURINARY: No dysuria, hematuria.  ENDOCRINE: No polyuria, nocturia,  HEMATOLOGY: No anemia, easy bruising or bleeding SKIN: No rash or lesion. MUSCULOSKELETAL: No joint pain or arthritis.   NEUROLOGIC: No tingling, numbness, weakness.  PSYCHIATRY: No anxiety or depression.   MEDICATIONS AT HOME:  Prior to Admission medications   Medication Sig Start Date End Date Taking? Authorizing Provider  amitriptyline (ELAVIL) 25 MG tablet TAKE ONE TABLET BY MOUTH AT BEDTIME AS DIRECTED 09/12/15   Historical Provider, MD  aspirin EC 81 MG tablet Take by mouth.    Historical Provider, MD  furosemide (LASIX) 20 MG tablet TAKE 1 TABLET(20 MG) BY MOUTH EVERY DAY 06/13/15   Historical Provider, MD  lidocaine (XYLOCAINE) 2 % solution  10/16/15   Historical Provider, MD  lidocaine (XYLOCAINE) 2 % solution  10/16/15   Historical Provider, MD  lisinopril (PRINIVIL,ZESTRIL) 10 MG tablet TAKE 1 TABLET BY MOUTH ONCE DAILY 10/02/15   Historical Provider, MD  lisinopril (PRINIVIL,ZESTRIL) 10 MG tablet  10/02/15   Historical Provider, MD  metoprolol tartrate (LOPRESSOR) 25 MG tablet TAKE 1/2 TABLET BY MOUTH TWICE DAILY 06/26/15   Historical  Provider, MD  Multiple Vitamin (MULTI-VITAMINS) TABS Take by mouth.    Historical Provider, MD  nystatin cream (MYCOSTATIN) APPLY TO THE AFFECTED AREA TWICE DAILY 09/10/15   Historical Provider, MD  nystatin cream (MYCOSTATIN)  09/10/15   Historical Provider, MD  pantoprazole (PROTONIX) 40 MG tablet Take 1 tablet (40 mg total) by mouth 2 (two) times daily. 11/01/15   Lucilla Lame, MD  ranitidine (ZANTAC) 150 MG tablet TAKE 1 TABLET BY MOUTH NIGHTLY 10/29/15   Historical Provider, MD  ranitidine (ZANTAC) 150 MG tablet  10/29/15   Historical Provider, MD   simvastatin (ZOCOR) 40 MG tablet TAKE 1 TABLET BY MOUTH EVERY DAY 05/28/15   Historical Provider, MD  spironolactone (ALDACTONE) 25 MG tablet TAKE 1/2 TABLET BY MOUTH EVERY DAY 09/06/15   Historical Provider, MD      PHYSICAL EXAMINATION:   VITAL SIGNS: Blood pressure (!) 153/90, pulse 77, temperature 97.6 F (36.4 C), temperature source Oral, resp. rate 16, height 5' 4.5" (1.638 m), weight 172 lb (78 kg), SpO2 97 %.  GENERAL:  83 y.o.-year-old patient lying in the bed with no acute distress.  EYES: Pupils equal, round, reactive to light and accommodation. No scleral icterus. Extraocular muscles intact.  HEENT: Head atraumatic, normocephalic. Oropharynx and nasopharynx clear.  NECK:  Supple, no jugular venous distention. No thyroid enlargement, no tenderness.  LUNGS: Normal breath sounds bilaterally, no wheezing, rales,rhonchi or crepitation. No use of accessory muscles of respiration.  CARDIOVASCULAR: S1, S2 normal. No murmurs, rubs, or gallops.  ABDOMEN: Soft, nontender, nondistended. Bowel sounds present. No organomegaly or mass.  EXTREMITIES: No pedal edema, cyanosis, or clubbing.  NEUROLOGIC: Cranial nerves II through XII are intact. Muscle strength 5/5 in all extremities. Sensation intact. Gait not checked.  PSYCHIATRIC: The patient is alert and oriented x 3.  SKIN: No obvious rash, lesion, or ulcer.   LABORATORY PANEL:   CBC  Recent Labs Lab 01/28/16 1613  WBC 6.8  HGB 13.9  HCT 40.6  PLT 231  MCV 92.0  MCH 31.6  MCHC 34.3  RDW 14.3   ------------------------------------------------------------------------------------------------------------------  Chemistries   Recent Labs Lab 01/28/16 1613  NA 137  K 4.1  CL 108  CO2 22  GLUCOSE 123*  BUN 13  CREATININE 0.71  CALCIUM 9.7  AST 24  ALT 16  ALKPHOS 64  BILITOT 1.0   ------------------------------------------------------------------------------------------------------------------ estimated creatinine  clearance is 50.6 mL/min (by C-G formula based on SCr of 0.71 mg/dL). ------------------------------------------------------------------------------------------------------------------ No results for input(s): TSH, T4TOTAL, T3FREE, THYROIDAB in the last 72 hours.  Invalid input(s): FREET3   Coagulation profile No results for input(s): INR, PROTIME in the last 168 hours. ------------------------------------------------------------------------------------------------------------------- No results for input(s): DDIMER in the last 72 hours. -------------------------------------------------------------------------------------------------------------------  Cardiac Enzymes  Recent Labs Lab 01/28/16 1613  TROPONINI <0.03   ------------------------------------------------------------------------------------------------------------------ Invalid input(s): POCBNP  ---------------------------------------------------------------------------------------------------------------  Urinalysis    Component Value Date/Time   COLORURINE Yellow 09/19/2013 1329   APPEARANCEUR Clear 09/19/2013 1329   LABSPEC 1.014 09/19/2013 1329   PHURINE 5.0 09/19/2013 1329   GLUCOSEU Negative 09/19/2013 1329   HGBUR Negative 09/19/2013 1329   BILIRUBINUR Negative 09/19/2013 1329   KETONESUR Negative 09/19/2013 1329   PROTEINUR Negative 09/19/2013 1329   NITRITE Negative 09/19/2013 1329   LEUKOCYTESUR Negative 09/19/2013 1329     RADIOLOGY: Ct Head Wo Contrast  Result Date: 01/28/2016 CLINICAL DATA:  Blurred vision and memory loss starting this morning. EXAM: CT HEAD WITHOUT CONTRAST TECHNIQUE: Contiguous axial images were obtained from the base of the  skull through the vertex without intravenous contrast. COMPARISON:  None. FINDINGS: Brain: There is mild generalized age related parenchymal atrophy with commensurate dilatation of the ventricles and sulci. There are mild chronic microvascular ischemic change  within the deep periventricular white matter regions bilaterally. There is no mass, hemorrhage, edema or other evidence of acute parenchymal abnormality. No extra-axial hemorrhage. Vascular: There are chronic calcified atherosclerotic changes of the large vessels at the skull base. No unexpected hyperdense vessel. Skull: Normal. Negative for fracture or focal lesion. Sinuses/Orbits: No acute finding. Other: None. IMPRESSION: No acute findings.  No intracranial mass, hemorrhage or edema. Electronically Signed   By: Franki Cabot M.D.   On: 01/28/2016 16:03    EKG: Orders placed or performed during the hospital encounter of 01/28/16  . ED EKG  . ED EKG  . EKG 12-Lead  . EKG 12-Lead    IMPRESSION AND PLAN: Patient's 83 year old with brief episode of confusion now back to baseline  1. TIA Will place under observation overnight obtain carotid Dopplers and echocardiogram of the heart Continue aspirin Fasting lipid panel in the morning  2. Essential hypertension Continue therapy with lisinopril and metoprolol  3. Hyperlipidemia unspecified continue therapy with simvastatin  4. GERD we'll continue Protonix  5. Miscellaneous we'll do Lovenox for DVT prophylaxis   All the records are reviewed and case discussed with ED provider. Management plans discussed with the patient, family and they are in agreement.  CODE STATUS: Code Status History    This patient does not have a recorded code status. Please follow your organizational policy for patients in this situation.       TOTAL TIME TAKING CARE OF THIS PATIENT: 55 minutes.    [name] M.D on 01/28/2016 at 7:19 PM  Between 7am to 6pm - Pager - [phone]  After 6pm go to [email or web address] - password [email or web address] [facility]  Office  [phone]  CC: Primary care physician; Madelyn Brunner, MD

## 2016-01-28 NOTE — ED Notes (Signed)
Patient transported to CT 

## 2016-01-28 NOTE — ED Provider Notes (Signed)
[facility] Emergency Department Provider Note    ____________________________________________   I have reviewed the triage vital signs and the nursing notes.   HISTORY  Chief Complaint Confusion  History limited by: Not Limited   HPI Megan Salinas is a 83 y.o. female who presents to the emergency department today because of concern for confusion. The patient states that she was talking to her daughter on the phone when she became confused. She was working on Pension scheme manager and could not remember certain things. At the same time she felt like her eyesight became blurry. The patient states she has a history of occular migraines and that it is not uncommon for her eyesight to go blurry. The patient denies any current symptoms. Denies any headaches.    Past Medical History:  Diagnosis Date  . GERD (gastroesophageal reflux disease)   . High cholesterol   . Hypertension     Patient Active Problem List   Diagnosis Date Noted  . Carpal tunnel syndrome, right 08/13/2015  . DDD (degenerative disc disease), lumbar 09/12/2014  . Lumbar radiculitis 09/12/2014  . Lumbar stenosis with neurogenic claudication 09/12/2014  . GERD (gastroesophageal reflux disease) 09/08/2014  . History of type 2 diabetes mellitus 09/08/2014  . Benign essential hypertension 06/16/2014  . Moderate tricuspid insufficiency 06/07/2014  . Abdominal pain, diffuse 03/09/2014  . Chronic systolic CHF (congestive heart failure), NYHA class 3 ([place]) 12/05/2013  . Moderate mitral insufficiency 12/05/2013  . Coronary artery disease 03/29/2013  . Mixed hyperlipidemia 03/29/2013    Past Surgical History:  Procedure Laterality Date  . BREAST CYST ASPIRATION Right   . Cardiac bypass      Prior to Admission medications   Medication Sig Start Date End Date Taking? Authorizing Provider  amitriptyline (ELAVIL) 25 MG tablet TAKE ONE TABLET BY MOUTH AT BEDTIME AS DIRECTED 09/12/15   Historical Provider,  MD  aspirin EC 81 MG tablet Take by mouth.    Historical Provider, MD  furosemide (LASIX) 20 MG tablet TAKE 1 TABLET(20 MG) BY MOUTH EVERY DAY 06/13/15   Historical Provider, MD  lidocaine (XYLOCAINE) 2 % solution  10/16/15   Historical Provider, MD  lidocaine (XYLOCAINE) 2 % solution  10/16/15   Historical Provider, MD  lisinopril (PRINIVIL,ZESTRIL) 10 MG tablet TAKE 1 TABLET BY MOUTH ONCE DAILY 10/02/15   Historical Provider, MD  lisinopril (PRINIVIL,ZESTRIL) 10 MG tablet  10/02/15   Historical Provider, MD  metoprolol tartrate (LOPRESSOR) 25 MG tablet TAKE 1/2 TABLET BY MOUTH TWICE DAILY 06/26/15   Historical Provider, MD  Multiple Vitamin (MULTI-VITAMINS) TABS Take by mouth.    Historical Provider, MD  nystatin cream (MYCOSTATIN) APPLY TO THE AFFECTED AREA TWICE DAILY 09/10/15   Historical Provider, MD  nystatin cream (MYCOSTATIN)  09/10/15   Historical Provider, MD  pantoprazole (PROTONIX) 40 MG tablet Take 1 tablet (40 mg total) by mouth 2 (two) times daily. 11/01/15   Lucilla Lame, MD  ranitidine (ZANTAC) 150 MG tablet TAKE 1 TABLET BY MOUTH NIGHTLY 10/29/15   Historical Provider, MD  ranitidine (ZANTAC) 150 MG tablet  10/29/15   Historical Provider, MD  simvastatin (ZOCOR) 40 MG tablet TAKE 1 TABLET BY MOUTH EVERY DAY 05/28/15   Historical Provider, MD  spironolactone (ALDACTONE) 25 MG tablet TAKE 1/2 TABLET BY MOUTH EVERY DAY 09/06/15   Historical Provider, MD    Allergies Sulfa antibiotics  Family History  Problem Relation Age of Onset  . Breast cancer Paternal Aunt     34's  Social History Social History  Substance Use Topics  . Smoking status: Former Research scientist (life sciences)  . Smokeless tobacco: Never Used  . Alcohol use Yes     Comment: occasional glass of wine    Review of Systems  Constitutional: Negative for fever. Cardiovascular: Negative for chest pain. Respiratory: Negative for shortness of breath. Gastrointestinal: Negative for abdominal pain, vomiting and diarrhea. Genitourinary:  Negative for dysuria. Musculoskeletal: Negative for back pain. Skin: Negative for rash. Neurological: Positive for blurry vision, confusion  10-point ROS otherwise negative.  ____________________________________________   PHYSICAL EXAM:  VITAL SIGNS: ED Triage Vitals [01/28/16 1516]  Enc Vitals Group     BP (!) 174/80     Pulse Rate 73     Resp 16     Temp 97.6 F (36.4 C)     Temp Source Oral     SpO2 100 %     Weight 172 lb (78 kg)     Height 5' 4.5" (1.638 m)    Constitutional: Alert and oriented. Well appearing and in no distress. Eyes: Conjunctivae are normal. Normal extraocular movements. ENT   Head: Normocephalic and atraumatic.   Nose: No congestion/rhinnorhea.   Mouth/Throat: Mucous membranes are moist.   Neck: No stridor. Hematological/Lymphatic/Immunilogical: No cervical lymphadenopathy. Cardiovascular: Normal rate, regular rhythm.  No murmurs, rubs, or gallops. Respiratory: Normal respiratory effort without tachypnea nor retractions. Breath sounds are clear and equal bilaterally. No wheezes/rales/rhonchi. Gastrointestinal: Soft and non tender. No rebound. No guarding.  Genitourinary: Deferred Musculoskeletal: Normal range of motion in all extremities. No lower extremity edema. Neurologic:  Normal speech and language. Face symmetric. Tongue midline. PERRL. EOMI. No pronator drift. Strength 5/5 in upper and lower extremities. Sensation intact.  Skin:  Skin is warm, dry and intact. No rash noted. Psychiatric: Mood and affect are normal. Speech and behavior are normal. Patient exhibits appropriate insight and judgment.  ____________________________________________    LABS (pertinent positives/negatives)  Labs Reviewed  COMPREHENSIVE METABOLIC PANEL - Abnormal; Notable for the following:       Result Value   Glucose, Bld 123 (*)    All other components within normal limits  GLUCOSE, CAPILLARY - Abnormal; Notable for the following:     Glucose-Capillary 100 (*)    All other components within normal limits  CBC  TROPONIN I     ____________________________________________   EKG  I, Nance Pear, attending physician, personally viewed and interpreted this EKG  EKG Time: 1549 Rate: 69 Rhythm: sinus rhythm with 1st degree av block Axis: normal Intervals: qtc 452 QRS: narrow ST changes: no st elevation Impression: abnormal ekg   ____________________________________________    RADIOLOGY  CT head  IMPRESSION: No acute findings. No intracranial mass, hemorrhage or edema.  ____________________________________________   PROCEDURES  Procedures  ____________________________________________   INITIAL IMPRESSION / ASSESSMENT AND PLAN / ED COURSE  Pertinent labs & imaging results that were available during my care of the patient were reviewed by me and considered in my medical decision making (see chart for details).  Patient presented to the emergency department because of an episode of confusion. Head CT negative for acute findings. The patient blood work without concerning findings. Discussed with neurology on call. Concern for possible TIA. Will plan on admission to the hospitalist service.  ____________________________________________   FINAL CLINICAL IMPRESSION(S) / ED DIAGNOSES  Final diagnoses:  Confusion     Note: This dictation was prepared with Dragon dictation. Any transcriptional errors that result from this process are unintentional     [place]  Archie Balboa, MD 01/28/16 [id number]

## 2016-01-28 NOTE — ED Notes (Signed)
Transporting patient to room 118-1C 

## 2016-01-28 NOTE — ED Triage Notes (Signed)
Pt to ED via EMS from [facility] c/o blurred vision and memory loss that started this morning while talking to daughter on the phone.  Pt denies blurred vision at this time and is A&Ox4, neuro screen negative.  EMS vitals 189/89 BP, 124 CBG, 79 HR NSR.

## 2016-01-29 ENCOUNTER — Observation Stay
Admit: 2016-01-29 | Discharge: 2016-01-29 | Disposition: A | Discharge status: Home / Self Care | Payer: Medicare Other | Attending: Internal Medicine | Admitting: Internal Medicine

## 2016-01-29 ENCOUNTER — Observation Stay: Payer: Medicare Other

## 2016-01-29 DIAGNOSIS — R410 Disorientation, unspecified: Secondary | ICD-10-CM | POA: Diagnosis not present

## 2016-01-29 LAB — ECHOCARDIOGRAM COMPLETE
Height: 65 in
Weight: 2729.6 oz

## 2016-01-29 LAB — LIPID PANEL
Cholesterol: 160 mg/dL (ref 0–200)
HDL: 61 mg/dL (ref >40)
LDL CALC: 67 mg/dL (ref 0–99)
Total CHOL/HDL Ratio: 2.6 RATIO
Triglycerides: 158 mg/dL — ABNORMAL HIGH (ref <150)
VLDL: 32 mg/dL (ref 0–40)

## 2016-01-29 NOTE — Progress Notes (Signed)
Pt for discharge home to [place].  Alert. No resp distress. No s/s deficts.  dtrs at bedside.  No c/o pain.  Instructions discussed with pt and dtrs. meds / diet activity and f/u discussed.  Verbalize understanding  Of  Discharge. Home via w/c at this time with dtrs.

## 2016-01-29 NOTE — Care Management (Signed)
Admitted to this facility with the diagnosis of TIA under observation status. Lives alone in a cottage x 9 years at [facility]. Daughter is [employer] [phone]). Last seen Dr. Lisette Grinder 3 months ago. Prescriptions are filled at [facility] [address]. Home Health 6 years ago. Doesn't remember name of agency. Skilled Nursing at [facility] 6 years ago. No home oxygen. Rolling walker, cane, and bedside commode in the home. No falls. Good appetite. Takes care of all basic and instrumental activities of daily living herself, drives. Family will transport. Shelbie Ammons RN MSN CCM Care Management

## 2016-01-29 NOTE — Evaluation (Signed)
Physical Therapy Evaluation Patient Details Name: Megan Salinas MRN: [id number] DOB: June 18, 1926 Today's Date: 01/29/2016   History of Present Illness  Patient is an 83 y/o female that presents with complaints of confusion and word finding difficulty. CT scan was negative.   Clinical Impression  Patient presents with signs and symptoms consistent with TIA, she reports her symptoms have improved and she is now at her baseline. She lives alone and typically ambulates with SPC/RW as needed, she denies a history of falls. Patient is modified independent with all mobility in this session, with no signs of balance impairments or loss of balance with any activity. This appears to be her baseline level of mobility, which she confirms. No PT needs identified, PT will sign off.     Follow Up Recommendations No PT follow up    Equipment Recommendations       Recommendations for Other Services       Precautions / Restrictions Precautions Precautions: Fall Restrictions Weight Bearing Restrictions: No      Mobility  Bed Mobility Overal bed mobility: Modified Independent             General bed mobility comments: Patient is able   Transfers Overall transfer level: Modified independent Equipment used: Straight cane             General transfer comment: Patient takes prolonged time to complete transfer, no loss of balance noted.   Ambulation/Gait Ambulation/Gait assistance: Modified independent (Device/Increase time) Ambulation Distance (Feet): 200 Feet Assistive device: Straight cane Gait Pattern/deviations: WFL(Within Functional Limits)   Gait velocity interpretation: Below normal speed for age/gender General Gait Details: Patient had lateral lean to L, sequencing with SPC was appropriate, no loss of balance with turns noted.   Stairs            Wheelchair Mobility    Modified Rankin (Stroke Patients Only)       Balance Overall balance assessment: Modified  Independent                                           Pertinent Vitals/Pain Pain Assessment: No/denies pain (Patient reports history of chronic knee pain with ambulating. )    Home Living Family/patient expects to be discharged to:: Private residence Living Arrangements: Alone Available Help at Discharge: Family;Available PRN/intermittently Type of Home: House Home Access: Level entry     Home Layout: One level Home Equipment: Walker - 2 wheels;Cane - single point;Bedside commode      Prior Function Level of Independence: Independent with assistive device(s)         Comments: Patient denies falls recently.      Hand Dominance        Extremity/Trunk Assessment   Upper Extremity Assessment Upper Extremity Assessment: LUE deficits/detail LUE Deficits / Details: Chronic shoulder pain/AROM limited to 90 degrees.     Lower Extremity Assessment Lower Extremity Assessment: Overall WFL for tasks assessed       Communication   Communication: No difficulties  Cognition Arousal/Alertness: Awake/alert Behavior During Therapy: WFL for tasks assessed/performed Overall Cognitive Status: Within Functional Limits for tasks assessed                      General Comments      Exercises     Assessment/Plan    PT Assessment Patent does not need any further PT services  PT Problem List            PT Treatment Interventions      PT Goals (Current goals can be found in the Care Plan section)  Acute Rehab PT Goals Patient Stated Goal: To return home  PT Goal Formulation: With patient/family Time For Goal Achievement: 02/12/16 Potential to Achieve Goals: Good    Frequency     Barriers to discharge        Co-evaluation               End of Session Equipment Utilized During Treatment: Gait belt Activity Tolerance: Patient tolerated treatment well Patient left: in bed;with family/visitor present;with call bell/phone within reach  (Bed alarm off when PT entered room ) Nurse Communication: Mobility status    Functional Assessment Tool Used: Patient report, clinical judgement  Functional Limitation: Mobility: Walking and moving around Mobility: Walking and Moving Around Current Status [phone]): 0 percent impaired, limited or restricted Mobility: Walking and Moving Around Discharge Status [phone]): 0 percent impaired, limited or restricted    Time: 1129-1141 PT Time Calculation (min) (ACUTE ONLY): 12 min   Charges:   PT Evaluation $PT Eval Low Complexity: 1 Procedure     PT G Codes:   PT G-Codes **NOT FOR INPATIENT CLASS** Functional Assessment Tool Used: Patient report, clinical judgement  Functional Limitation: Mobility: Walking and moving around Mobility: Walking and Moving Around Current Status [id number]): 0 percent impaired, limited or restricted Mobility: Walking and Moving Around Discharge Status [id number]): 0 percent impaired, limited or restricted   Kerman Passey, PT, DPT    01/29/2016, 11:55 AM

## 2016-01-29 NOTE — Progress Notes (Signed)
*  PRELIMINARY RESULTS* Echocardiogram 2D Echocardiogram has been performed.  Megan Salinas 01/29/2016, 8:07 AM

## 2016-01-29 NOTE — Progress Notes (Signed)
Chaplain was making his rounds and visited with pt in room 118. Pt reported having a possible stroke. Pt was awaiting discharge and was in good spirits. Chaplain provided [employer] of prayer.    01/29/16 1401  Clinical Encounter Type  Visited With Patient;Patient and family together  Visit Type Initial;Spiritual support  Referral From Nurse  Spiritual Encounters  Spiritual Needs Prayer

## 2016-01-29 NOTE — Care Management Obs Status (Signed)
[place] NOTIFICATION   Patient Details  Name: Megan Salinas MRN: [id number] Date of Birth: 03/03/1926   Medicare Observation Status Notification Given:  Yes    Shelbie Ammons, RN 01/29/2016, 9:39 AM

## 2016-01-29 NOTE — Discharge Summary (Addendum)
[facility] NAME: Megan Salinas    MR#:  [id number]  DATE OF BIRTH:  [birth year]  DATE OF ADMISSION:  01/28/2016 ADMITTING PHYSICIAN: [name], MD  DATE OF DISCHARGE: 01/29/16 PRIMARY CARE PHYSICIAN: Madelyn Brunner, MD    ADMISSION DIAGNOSIS:  TIA (transient ischemic attack) [G45.9] Confusion [R41.0]  DISCHARGE DIAGNOSIS:  Active Problems:   TIA (transient ischemic attack)   SECONDARY DIAGNOSIS:   Past Medical History:  Diagnosis Date  . Back pain   . CAD (coronary artery disease)   . GERD (gastroesophageal reflux disease)   . High cholesterol   . Hypertension     HOSPITAL COURSE:  HISTORY OF PRESENT ILLNESS: Megan Salinas  is a 83 y.o. female with a known history of Coronary artery disease, GERD, hyperlipidemia, essential hypertension who is presenting to the hospital with complaining of brief episode of confusion. Patient was on the phone with her daughter and thought that she was confused. Patient also had difficulty getting her words out this lasted for a few minutes. Patient also reports that she has a history of ocular migraines and was on the computer when her vision became blurred but this is nothing uncommon for her. Due to the symptoms she is brought to the ED CT scan of the head is negative  Hospital course  1. TIA TIA ruled out.  CT head is negative  echocardiogram with 52% ejection fraction per preliminary report. Cardiology Dr. Nehemiah Massed has to review the final report Carotid Dopplers with no acute findings Fasting lipid panel -LDL 67 No PT needs identified Patient reports chronic carpal tunnel syndrome of hand and sees orthopedics as an outpatient  2. Essential hypertension Stable.  There is a concern of noncompliance and missing doses, recommended to organize pills in a pill organizer and family supervision. Reinforced the Importance of being compliant with the medications Continue  therapy with lisinopril and metoprolol  3. Hyperlipidemia unspecified continue therapy with simvastatin  4. GERD we'll continue Protonix  5. Miscellaneous we'll do Lovenox for DVT prophylaxis   Plan of care discussed with the patient and discharge patient home patient is agreeable  DISCHARGE CONDITIONS:   FAIR  CONSULTS OBTAINED:     PROCEDURES NONE   DRUG ALLERGIES:   Allergies  Allergen Reactions  . Sulfa Antibiotics     DISCHARGE MEDICATIONS:   Current Discharge Medication List    CONTINUE these medications which have NOT CHANGED   Details  amitriptyline (ELAVIL) 25 MG tablet TAKE 1/2 TABLET BY MOUTH AT BEDTIME AS NEEDED    aspirin EC 81 MG tablet Take by mouth.    lisinopril (PRINIVIL,ZESTRIL) 10 MG tablet TAKE 1 TABLET BY MOUTH ONCE DAILY    methylcellulose (CITRUCEL) oral powder Take 1 packet by mouth daily.    metoprolol tartrate (LOPRESSOR) 25 MG tablet TAKE 1/2 TABLET BY MOUTH TWICE DAILY    Multiple Vitamin (MULTI-VITAMINS) TABS Take by mouth.    nystatin cream (MYCOSTATIN) APPLY TO THE AFFECTED AREA TWICE DAILY    pantoprazole (PROTONIX) 40 MG tablet Take 1 tablet (40 mg total) by mouth 2 (two) times daily. Qty: 60 tablet, Refills: 11    polyethylene glycol (MIRALAX / GLYCOLAX) packet Take 17 g by mouth daily as needed.    ranitidine (ZANTAC) 150 MG tablet TAKE 1 TABLET BY MOUTH NIGHTLY    simvastatin (ZOCOR) 40 MG tablet TAKE 1 TABLET BY MOUTH EVERY DAY    spironolactone (ALDACTONE) 25 MG tablet  TAKE 1/2 TABLET BY MOUTH EVERY DAY         DISCHARGE INSTRUCTIONS:   Follow-up with primary care physician in a week Follow-up with orthopedics regarding carpal tunnel syndrome as scheduled / recommended  DIET:  Cardiac diet  DISCHARGE CONDITION:  Stable  ACTIVITY:  Activity as tolerated  OXYGEN:  Home Oxygen: No.   Oxygen Delivery: room air  DISCHARGE LOCATION:  home   If you experience worsening of your admission symptoms,  develop shortness of breath, life threatening emergency, suicidal or homicidal thoughts you must seek medical attention immediately by calling 911 or calling your MD immediately  if symptoms less severe.  You Must read complete instructions/literature along with all the possible adverse reactions/side effects for all the Medicines you take and that have been prescribed to you. Take any new Medicines after you have completely understood and accpet all the possible adverse reactions/side effects.   Please note  You were cared for by a hospitalist during your hospital stay. If you have any questions about your discharge medications or the care you received while you were in the hospital after you are discharged, you can call the unit and asked to speak with the hospitalist on call if the hospitalist that took care of you is not available. Once you are discharged, your primary care physician will handle any further medical issues. Please note that NO REFILLS for any discharge medications will be authorized once you are discharged, as it is imperative that you return to your primary care physician (or establish a relationship with a primary care physician if you do not have one) for your aftercare needs so that they can reassess your need for medications and monitor your lab values.     Today  Chief Complaint  Patient presents with  . Blurred Vision   Patient denies any weakness. Has chronic carpal tunnel syndrome of the hand. Denies any headache or E vision. Denies any swallowing difficulties  ROS:  CONSTITUTIONAL: Denies fevers, chills. Denies any fatigue, weakness.  EYES: Denies blurry vision, double vision, eye pain. EARS, NOSE, THROAT: Denies tinnitus, ear pain, hearing loss. RESPIRATORY: Denies cough, wheeze, shortness of breath.  CARDIOVASCULAR: Denies chest pain, palpitations, edema.  GASTROINTESTINAL: Denies nausea, vomiting, diarrhea, abdominal pain. Denies bright red blood per  rectum. GENITOURINARY: Denies dysuria, hematuria. ENDOCRINE: Denies nocturia or thyroid problems. HEMATOLOGIC AND LYMPHATIC: Denies easy bruising or bleeding. SKIN: Denies rash or lesion. MUSCULOSKELETAL: Denies pain in neck, back, shoulder, knees, hips or arthritic symptoms.  NEUROLOGIC: Denies paralysis, paresthesias.  PSYCHIATRIC: Denies anxiety or depressive symptoms.   VITAL SIGNS:  Blood pressure (!) 124/58, pulse 68, temperature 98.3 F (36.8 C), resp. rate 18, height 5\' 5"  (1.651 m), weight 77.4 kg (170 lb 9.6 oz), SpO2 97 %.  I/O:    Intake/Output Summary (Last 24 hours) at 01/29/16 1545 Last data filed at 01/29/16 1409  Gross per 24 hour  Intake              480 ml  Output                0 ml  Net              480 ml    PHYSICAL EXAMINATION:  GENERAL:  83 y.o.-year-old patient lying in the bed with no acute distress.  EYES: Pupils equal, round, reactive to light and accommodation. No scleral icterus. Extraocular muscles intact.  HEENT: Head atraumatic, normocephalic. Oropharynx and nasopharynx clear.  NECK:  Supple,  no jugular venous distention. No thyroid enlargement, no tenderness.  LUNGS: Normal breath sounds bilaterally, no wheezing, rales,rhonchi or crepitation. No use of accessory muscles of respiration.  CARDIOVASCULAR: S1, S2 normal. No murmurs, rubs, or gallops.  ABDOMEN: Soft, non-tender, non-distended. Bowel sounds present. No organomegaly or mass.  EXTREMITIES: No pedal edema, cyanosis, or clubbing.  NEUROLOGIC: Cranial nerves II through XII are intact. Muscle strength 5/5 in all extremities. Sensation intact. Gait not checked.  PSYCHIATRIC: The patient is alert and oriented x 3.  SKIN: No obvious rash, lesion, or ulcer.   DATA REVIEW:   CBC  Recent Labs Lab 01/28/16 1613  WBC 6.8  HGB 13.9  HCT 40.6  PLT 231    Chemistries   Recent Labs Lab 01/28/16 1613  NA 137  K 4.1  CL 108  CO2 22  GLUCOSE 123*  BUN 13  CREATININE 0.71  CALCIUM  9.7  AST 24  ALT 16  ALKPHOS 64  BILITOT 1.0    Cardiac Enzymes  Recent Labs Lab 01/28/16 1613  TROPONINI <0.03    Microbiology Results  Results for orders placed or performed in visit on 09/19/13  Clostridium Difficile [facility])     Status: None   Collection Time: 09/19/13  5:04 PM  Result Value Ref Range Status   Micro Text Report   Final       C.DIFFICILE ANTIGEN       C.DIFFICILE GDH ANTIGEN : NEGATIVE   C.DIFFICILE TOXIN A/B     C.DIFFICILE TOXINS A AND B : NEGATIVE   INTERPRETATION            Negative for C. difficile.    ANTIBIOTIC                                                      Stool culture     Status: None   Collection Time: 09/19/13  6:29 PM  Result Value Ref Range Status   Micro Text Report   Final       COMMENT                   NO SALMONELLA OR SHIGELLA ISOLATED   COMMENT                   NO PATHOGENIC E.COLI DETECTED   COMMENT                   POSITIVE CAMPYLOBACTER AG (JEJUNI/COLI)   ANTIBIOTIC                                                        RADIOLOGY:  Ct Head Wo Contrast  Result Date: 01/28/2016 CLINICAL DATA:  Blurred vision and memory loss starting this morning. EXAM: CT HEAD WITHOUT CONTRAST TECHNIQUE: Contiguous axial images were obtained from the base of the skull through the vertex without intravenous contrast. COMPARISON:  None. FINDINGS: Brain: There is mild generalized age related parenchymal atrophy with commensurate dilatation of the ventricles and sulci. There are mild chronic microvascular ischemic change within the deep periventricular white matter regions bilaterally. There is no mass, hemorrhage, edema or other evidence of acute parenchymal  abnormality. No extra-axial hemorrhage. Vascular: There are chronic calcified atherosclerotic changes of the large vessels at the skull base. No unexpected hyperdense vessel. Skull: Normal. Negative for fracture or focal lesion. Sinuses/Orbits: No acute finding. Other: None. IMPRESSION: No  acute findings.  No intracranial mass, hemorrhage or edema. Electronically Signed   By: Franki Cabot M.D.   On: 01/28/2016 16:03   US Carotid Bilateral  Result Date: 01/29/2016 CLINICAL DATA:  TIA. History of CAD (post CABG), hypertension and hyperlipidemia. EXAM: BILATERAL CAROTID DUPLEX ULTRASOUND TECHNIQUE: Pearline Cables scale imaging, color Doppler and duplex ultrasound were performed of bilateral carotid and vertebral arteries in the neck. COMPARISON:  None. FINDINGS: Criteria: Quantification of carotid stenosis is based on velocity parameters that correlate the residual internal carotid diameter with NASCET-based stenosis levels, using the diameter of the distal internal carotid lumen as the denominator for stenosis measurement. The following velocity measurements were obtained: RIGHT ICA:  97/18 cm/sec CCA:  123XX123 cm/sec SYSTOLIC ICA/CCA RATIO:  1.2 DIASTOLIC ICA/CCA RATIO:  1.3 ECA:  103 cm/sec LEFT ICA:  76/17 cm/sec CCA:  123456 cm/sec SYSTOLIC ICA/CCA RATIO:  0.8 DIASTOLIC ICA/CCA RATIO:  1.0 ECA:  135 cm/sec RIGHT CAROTID ARTERY: There is a moderate to large amount of eccentric mixed echogenic partially shadowing plaque within the right carotid bulb (image 11), extending to involve the origin and proximal aspect the right internal carotid artery over image 18), not resulting in elevated peak systolic velocities within the interrogated course the right internal carotid artery to suggest a hemodynamically significant stenosis. RIGHT VERTEBRAL ARTERY:  Antegrade Flow LEFT CAROTID ARTERY: There is a minimal to moderate amount of eccentric mixed echogenic plaque within the left carotid bulb (images 37 and 38), extending to involve the origin and proximal aspect the left internal carotid artery (is 46), not resulting in elevated peak systolic velocities within the interrogated course of the left internal carotid artery to suggest a hemodynamically significant stenosis. LEFT VERTEBRAL ARTERY:  Antegrade Flow  Incidental note is made of an apparent cardiac arrhythmia (representative image 56). IMPRESSION: 1. Moderate to large amount of bilateral atherosclerotic plaque, right greater than left, not resulting in a hemodynamically significant stenosis within either internal carotid artery. 2. Incidental note made of an apparent cardiac arrhythmia. Further evaluation with ECG monitoring could be performed as clinically indicated. Electronically Signed   By: Ronny Bacon M.D.   On: 01/29/2016 09:54    EKG:   Orders placed or performed during the hospital encounter of 01/28/16  . ED EKG  . ED EKG  . EKG 12-Lead  . EKG 12-Lead      Management plans discussed with the patient, family and they are in agreement.  CODE STATUS:     Code Status Orders        Start     Ordered   01/28/16 2216  Full code  Continuous     01/28/16 2215    Code Status History    Date Active Date Inactive Code Status Order ID Comments User Context   This patient has a current code status but no historical code status.    Advance Directive Documentation   Flowsheet Row Most Recent Value  Type of Advance Directive  Healthcare Power of Attorney, Living will  Pre-existing out of facility DNR order (yellow form or pink MOST form)  No data  "MOST" Form in Place?  No data      TOTAL TIME TAKING CARE OF THIS PATIENT: 45 minutes.   Note: This dictation was  prepared with Dragon dictation along with smaller phrase technology. Any transcriptional errors that result from this process are unintentional.   @[facility] @  on 01/29/2016 at 3:45 PM  Between 7am to 6pm - Pager - [phone]  After 6pm go to [email or web address] - password [email or web address] [facility]  Office  [phone]  CC: Primary care physician; Madelyn Brunner, MD

## 2016-01-29 NOTE — Discharge Instructions (Signed)
Transient Ischemic Attack A transient ischemic attack (TIA) is a "warning stroke" that causes stroke-like symptoms. A TIA does not cause lasting damage to the brain. The symptoms of a TIA can happen fast and do not last long. It is important to know the symptoms of a TIA and what to do. This can help prevent stroke or death. Follow these instructions at home:  Take medicines only as told by your doctor. Make sure you understand all of the instructions.  You may need to take aspirin or warfarin medicine. Warfarin needs to be taken exactly as told.  Taking too much or too little warfarin is dangerous. Blood tests must be done as often as told by your doctor. A PT blood test measures how long it takes for blood to clot. Your PT is used to calculate another value called an INR. Your PT and INR help your doctor adjust your warfarin dosage. He or she will make sure you are taking the right amount.  Food can cause problems with warfarin and affect the results of your blood tests. This is true for foods high in vitamin K. Eat the same amount of foods high in vitamin K each day. Foods high in vitamin K include spinach, kale, broccoli, cabbage, collard and turnip greens, Brussels sprouts, peas, cauliflower, seaweed, and parsley. Other foods high in vitamin K include beef and pork liver, green tea, and soybean oil. Eat the same amount of foods high in vitamin K each day. Avoid big changes in your diet. Tell your doctor before changing your diet. Talk to a food specialist (dietitian) if you have questions.  Many medicines can cause problems with warfarin and affect your PT and INR. Tell your doctor about all medicines you take. This includes vitamins and dietary pills (supplements). Do not take or stop taking any prescribed or over-the-counter medicines unless your doctor tells you to.  Warfarin can cause more bruising or bleeding. Hold pressure over any cuts for longer than normal. Talk to your doctor about  other side effects of warfarin.  Avoid sports or activities that may cause injury or bleeding.  Be careful when you shave, floss, or use sharp objects.  Avoid or drink very little alcohol while taking warfarin. Tell your doctor if you change how much alcohol you drink.  Tell your dentist and other doctors that you take warfarin before any procedures.  Follow your diet program as told, if you are given one.  Keep a healthy weight.  Stay active. Try to get at least 30 minutes of activity on all or most days.  Do not use any tobacco products, including cigarettes, chewing tobacco, or electronic cigarettes. If you need help quitting, ask your doctor.  Limit alcohol intake to no more than 1 drink per day for nonpregnant women and 2 drinks per day for men. One drink equals 12 ounces of beer, 5 ounces of wine, or 1 ounces of hard liquor.  Do not abuse drugs.  Keep your home safe so you do not fall. You can do this by:  Putting grab bars in the bedroom and bathroom.  Raising toilet seats.  Putting a seat in the shower.  Keep all follow-up visits as told by your doctor. This is important. Contact a doctor if:  Your personality changes.  You have trouble swallowing.  You have double vision.  You are dizzy.  You have a fever. Get help right away if: These symptoms may be an emergency. Do not wait to see  if the symptoms will go away. Get medical help right away. Call your local emergency services (911 in the [place] Do not drive yourself to the hospital.  You have sudden weakness or lose feeling (go numb), especially on one side of the body. This can affect your:  Face.  Arm.  Leg.  You have sudden trouble walking.  You have sudden trouble moving your arms or legs.  You have sudden confusion.  You have trouble talking.  You have trouble understanding.  You have sudden trouble seeing in one or both eyes.  You lose your balance.  Your movements are not  smooth.  You have a sudden, very bad headache with no known cause.  You have new chest pain.  Your heartbeat is unsteady.  You are partly or totally unaware of what is going on around you. This information is not intended to replace advice given to you by your health care provider. Make sure you discuss any questions you have with your health care provider. Document Released: 10/09/2007 Document Revised: 09/03/2015 Document Reviewed: 04/06/2013 Elsevier Interactive Patient Education  2017 [place] with primary care physician in a week Follow-up with orthopedics regarding carpal tunnel syndrome as scheduled / recommended

## 2016-01-30 LAB — HEMOGLOBIN A1C
HEMOGLOBIN A1C: 6.6 % — AB (ref 4.8–5.6)
Mean Plasma Glucose: 143 mg/dL

## 2016-02-26 DIAGNOSIS — G454 Transient global amnesia: Secondary | ICD-10-CM | POA: Insufficient documentation

## 2016-03-12 DIAGNOSIS — I493 Ventricular premature depolarization: Secondary | ICD-10-CM | POA: Insufficient documentation

## 2016-03-21 ENCOUNTER — Encounter: Payer: Self-pay | Admitting: *Deleted

## 2016-03-26 ENCOUNTER — Ambulatory Visit
Admission: RE | Admit: 2016-03-26 | Discharge: 2016-03-26 | Disposition: A | Discharge status: Home / Self Care | Payer: Medicare Other | Source: Ambulatory Visit | Attending: Surgery | Admitting: Surgery

## 2016-03-26 ENCOUNTER — Encounter: Payer: Self-pay | Admitting: *Deleted

## 2016-03-26 ENCOUNTER — Ambulatory Visit: Payer: Medicare Other | Admitting: Anesthesiology

## 2016-03-26 ENCOUNTER — Encounter
Admission: RE | Disposition: A | Discharge status: Home / Self Care | Payer: Self-pay | Source: Ambulatory Visit | Attending: Surgery

## 2016-03-26 DIAGNOSIS — I2510 Atherosclerotic heart disease of native coronary artery without angina pectoris: Secondary | ICD-10-CM | POA: Diagnosis not present

## 2016-03-26 DIAGNOSIS — Z951 Presence of aortocoronary bypass graft: Secondary | ICD-10-CM | POA: Insufficient documentation

## 2016-03-26 DIAGNOSIS — Z87891 Personal history of nicotine dependence: Secondary | ICD-10-CM | POA: Insufficient documentation

## 2016-03-26 DIAGNOSIS — E785 Hyperlipidemia, unspecified: Secondary | ICD-10-CM | POA: Diagnosis not present

## 2016-03-26 DIAGNOSIS — Z79899 Other long term (current) drug therapy: Secondary | ICD-10-CM | POA: Diagnosis not present

## 2016-03-26 DIAGNOSIS — Z8673 Personal history of transient ischemic attack (TIA), and cerebral infarction without residual deficits: Secondary | ICD-10-CM | POA: Diagnosis not present

## 2016-03-26 DIAGNOSIS — Z7982 Long term (current) use of aspirin: Secondary | ICD-10-CM | POA: Diagnosis not present

## 2016-03-26 DIAGNOSIS — G5601 Carpal tunnel syndrome, right upper limb: Secondary | ICD-10-CM | POA: Insufficient documentation

## 2016-03-26 DIAGNOSIS — I110 Hypertensive heart disease with heart failure: Secondary | ICD-10-CM | POA: Insufficient documentation

## 2016-03-26 DIAGNOSIS — K219 Gastro-esophageal reflux disease without esophagitis: Secondary | ICD-10-CM | POA: Diagnosis not present

## 2016-03-26 DIAGNOSIS — I509 Heart failure, unspecified: Secondary | ICD-10-CM | POA: Insufficient documentation

## 2016-03-26 HISTORY — DX: Transient cerebral ischemic attack, unspecified: G45.9

## 2016-03-26 HISTORY — PX: CARPAL TUNNEL RELEASE: SHX101

## 2016-03-26 HISTORY — DX: Presence of external hearing-aid: Z97.4

## 2016-03-26 SURGERY — RELEASE, CARPAL TUNNEL, ENDOSCOPIC
Anesthesia: Regional | Laterality: Right | Wound class: Clean

## 2016-03-26 MED ORDER — TRAMADOL HCL 50 MG PO TABS: 50.0000 mg | ORAL_TABLET | Freq: Four times a day (QID) | ORAL | 0 refills | Status: DC | PRN

## 2016-03-26 MED ORDER — CEFAZOLIN SODIUM-DEXTROSE 2-4 GM/100ML-% IV SOLN
2.0000 g | Freq: Once | INTRAVENOUS | Status: AC
  Administered 2016-03-26: 2 g via INTRAVENOUS

## 2016-03-26 MED ORDER — LACTATED RINGERS IV SOLN
INTRAVENOUS | Status: DC
  Administered 2016-03-26: 12:00:00 via INTRAVENOUS

## 2016-03-26 MED ORDER — BUPIVACAINE HCL (PF) 0.5 % IJ SOLN
INTRAMUSCULAR | Status: DC | PRN
  Administered 2016-03-26: 10 mL

## 2016-03-26 MED ORDER — PROPOFOL 500 MG/50ML IV EMUL
INTRAVENOUS | Status: DC | PRN
  Administered 2016-03-26: 25 ug/kg/min via INTRAVENOUS

## 2016-03-26 MED ORDER — FENTANYL CITRATE (PF) 100 MCG/2ML IJ SOLN
INTRAMUSCULAR | Status: DC | PRN
  Administered 2016-03-26: 50 ug via INTRAVENOUS

## 2016-03-26 MED ORDER — LIDOCAINE HCL (CARDIAC) 20 MG/ML IV SOLN
INTRAVENOUS | Status: DC | PRN
  Administered 2016-03-26: 40 mg via INTRAVENOUS

## 2016-03-26 MED ORDER — MIDAZOLAM HCL 5 MG/5ML IJ SOLN
INTRAMUSCULAR | Status: DC | PRN
  Administered 2016-03-26: 1 mg via INTRAVENOUS

## 2016-03-26 SURGICAL SUPPLY — 28 items
BANDAGE ELASTIC 2 LF NS (GAUZE/BANDAGES/DRESSINGS) ×3 IMPLANT
BNDG CMPR MED 5X2 ELC HKLP NS (GAUZE/BANDAGES/DRESSINGS) ×1
BNDG COHESIVE 4X5 TAN STRL (GAUZE/BANDAGES/DRESSINGS) ×3 IMPLANT
BNDG ESMARK 4X12 TAN STRL LF (GAUZE/BANDAGES/DRESSINGS) ×3 IMPLANT
CHLORAPREP W/TINT 26ML (MISCELLANEOUS) ×3 IMPLANT
CORD BIP STRL DISP 12FT (MISCELLANEOUS) ×3 IMPLANT
COVER LIGHT HANDLE UNIVERSAL (MISCELLANEOUS) ×6 IMPLANT
CUFF TOURNIQUET DUAL PORT 18X3 (MISCELLANEOUS) ×3 IMPLANT
DRAPE SURG 17X23 STRL (DRAPES) ×3 IMPLANT
GAUZE PETRO XEROFOAM 1X8 (MISCELLANEOUS) ×3 IMPLANT
GAUZE SPONGE 4X4 12PLY STRL (GAUZE/BANDAGES/DRESSINGS) ×3 IMPLANT
GLOVE BIO SURGEON STRL SZ8 (GLOVE) ×3 IMPLANT
GLOVE INDICATOR 8.0 STRL GRN (GLOVE) ×3 IMPLANT
GOWN STRL REUS W/ TWL LRG LVL3 (GOWN DISPOSABLE) ×1 IMPLANT
GOWN STRL REUS W/ TWL XL LVL3 (GOWN DISPOSABLE) ×1 IMPLANT
GOWN STRL REUS W/TWL LRG LVL3 (GOWN DISPOSABLE) ×3
GOWN STRL REUS W/TWL XL LVL3 (GOWN DISPOSABLE) ×3
KIT CARPAL TUNNEL (MISCELLANEOUS) ×3
KIT ESCP INSRT D SLOT CANN KN (MISCELLANEOUS) ×1 IMPLANT
KIT ROOM TURNOVER OR (KITS) ×3 IMPLANT
NS IRRIG 500ML POUR BTL (IV SOLUTION) ×3 IMPLANT
PACK EXTREMITY ARMC (MISCELLANEOUS) ×3 IMPLANT
SPLINT WRIST M RT TX990303 (SOFTGOODS) ×2 IMPLANT
STOCKINETTE IMPERVIOUS 9X36 MD (GAUZE/BANDAGES/DRESSINGS) ×3 IMPLANT
STRAP BODY AND KNEE 60X3 (MISCELLANEOUS) ×3 IMPLANT
SUT PROLENE 4 0 PS 2 18 (SUTURE) ×3 IMPLANT
SUT VIC AB 3-0 SH 27 (SUTURE)
SUT VIC AB 3-0 SH 27X BRD (SUTURE) IMPLANT

## 2016-03-26 NOTE — H&P (Signed)
Paper H&P to be scanned into permanent record. H&P reviewed and patient re-examined. No changes. 

## 2016-03-26 NOTE — Anesthesia Procedure Notes (Signed)
Procedure Name: MAC Performed by: Morgan Rennert Pre-anesthesia Checklist: Patient identified, Emergency Drugs available, Suction available, Patient being monitored and Timeout performed Patient Re-evaluated:Patient Re-evaluated prior to inductionOxygen Delivery Method: Simple face mask Placement Confirmation: positive ETCO2 and breath sounds checked- equal and bilateral       

## 2016-03-26 NOTE — Transfer of Care (Signed)
Immediate Anesthesia Transfer of Care Note  Patient: Megan Salinas  Procedure(s) Performed: Procedure(s) with comments: CARPAL TUNNEL RELEASE ENDOSCOPIC  right (Right) - bier block  Patient Location: PACU  Anesthesia Type: Bier Block  Level of Consciousness: awake, alert  and patient cooperative  Airway and Oxygen Therapy: Patient Spontanous Breathing and Patient connected to supplemental oxygen  Post-op Assessment: Post-op Vital signs reviewed, Patient's Cardiovascular Status Stable, Respiratory Function Stable, Patent Airway and No signs of Nausea or vomiting  Post-op Vital Signs: Reviewed and stable  Complications: No apparent anesthesia complications

## 2016-03-26 NOTE — Op Note (Signed)
03/26/2016  1:36 PM  Patient:   Megan Salinas  Pre-Op Diagnosis:   Right carpal tunnel syndrome.  Post-Op Diagnosis:   Same.  Procedure:   Endoscopic right carpal tunnel release.  Surgeon:   Pascal Lux, MD  Anesthesia:   Bier block  Findings:   As above.  Complications:   None  EBL:   0 cc  Fluids:   700 cc crystalloid  TT:   25 minutes at 250 mmHg  Drains:   None  Closure:   4-0 Prolene interrupted sutures  Brief Clinical Note:   The patient is a 83 year old female with a long history of gradually worsening right hand pain and paresthesias. Her symptoms have progressed despite medications, activity modification, splinting, etc. Her history and examination are consistent with right carpal tunnel syndrome. The patient presents at this time for an endoscopic right carpal tunnel release.   Procedure:   The patient was brought into the operating room and lain in the supine position. After adequate IV sedation was achieved, a timeout was performed to verify the appropriate surgical site before a Bier block was placed by the anesthesiologist and the tourniquet inflated to 250 mmHg. The right hand and upper extremity were prepped with ChloraPrep solution before being draped sterilely. Preoperative antibiotics were administered. An approximately 1.5-2 cm incision was made over the volar wrist flexion crease, centered over the palmaris longus tendon. The incision was carried down through the subcutaneous tissues with care taken to identify and protect any neurovascular structures. The distal forearm fascia was penetrated just proximal to the transverse carpal ligament. The soft tissues were released off the superficial and deep surfaces of the distal forearm fascia and this was released proximally for 3-4 cm under direct visualization.  Attention was directed distally. The Soil scientist was passed beneath the transverse carpal ligament along the ulnar aspect of the carpal tunnel and  used to release any adhesions as well as to remove any adherent synovial tissue before first the smaller then the larger of the two dilators were passed beneath the transverse carpal ligament along the ulnar margin of the carpal tunnel. The slotted cannula was introduced and the endoscope was placed into the slotted cannula and the undersurface of the transverse carpal ligament visualized. The distal margin of the transverse carpal ligament was marked by placing a 25-gauge needle percutaneously at [place] cardinal point so that it entered the distal portion of the slotted cannula. Under endoscopic visualization, the transverse carpal ligament was released from proximal to distal using the end-cutting blade. A second pass was performed to ensure complete release of the ligament. The adequacy of release was verified both endoscopically and by palpation using the freer elevator.  The wound was irrigated thoroughly with sterile saline solution before being closed using 4-0 Prolene interrupted sutures. A total of 10 cc of 0.5% plain Sensorcaine was injected in and around the incision before a sterile bulky dressing was applied to the wound. The patient was placed into a volar wrist splint before being awakened and returned to the recovery room in satisfactory condition after tolerating the procedure well.

## 2016-03-26 NOTE — Anesthesia Postprocedure Evaluation (Signed)
Anesthesia Post Note  Patient: Megan Salinas  Procedure(s) Performed: Procedure(s) (LRB): CARPAL TUNNEL RELEASE ENDOSCOPIC  right (Right)  Patient location during evaluation: PACU Anesthesia Type: Bier Block Level of consciousness: awake and alert and oriented Pain management: satisfactory to patient Vital Signs Assessment: post-procedure vital signs reviewed and stable Respiratory status: spontaneous breathing, nonlabored ventilation and respiratory function stable Cardiovascular status: blood pressure returned to baseline and stable Postop Assessment: Adequate PO intake and No signs of nausea or vomiting Anesthetic complications: no    Raliegh Ip

## 2016-03-26 NOTE — Anesthesia Preprocedure Evaluation (Signed)
Anesthesia Evaluation  Patient identified by MRN, date of birth, ID band Patient awake    Reviewed: Allergy & Precautions, H&P , NPO status , Patient's Chart, lab work & pertinent test results  Airway Mallampati: II  TM Distance: >3 FB Neck ROM: full    Dental no notable dental hx.    Pulmonary former smoker,    Pulmonary exam normal        Cardiovascular hypertension, + CAD and +CHF  Normal cardiovascular exam     Neuro/Psych TIA   GI/Hepatic GERD  ,  Endo/Other    Renal/GU      Musculoskeletal   Abdominal   Peds  Hematology   Anesthesia Other Findings   Reproductive/Obstetrics                             Anesthesia Physical Anesthesia Plan  ASA: III  Anesthesia Plan: Bier Block   Post-op Pain Management:    Induction:   Airway Management Planned:   Additional Equipment:   Intra-op Plan:   Post-operative Plan:   Informed Consent: I have reviewed the patients History and Physical, chart, labs and discussed the procedure including the risks, benefits and alternatives for the proposed anesthesia with the patient or authorized representative who has indicated his/her understanding and acceptance.     Plan Discussed with:   Anesthesia Plan Comments:         Anesthesia Quick Evaluation

## 2016-03-26 NOTE — Anesthesia Procedure Notes (Addendum)
Anesthesia Regional Block: Bier block (IV Regional)   Pre-Anesthetic Checklist: ,, timeout performed, Correct Patient, Correct Site, Correct Laterality, Correct Procedure, Correct Position, site marked, Risks and benefits discussed, Surgical consent,  Pre-op evaluation,  At surgeon's request  Laterality: Right      Procedures:,,,,,,, Esmarch exsanguination, #20gu IV placed and double tourniquet utilized  Narrative:  Start time: 03/26/2016 1:08 PM End time: 03/26/2016 1:08 PM  Performed by: With CRNAs  Anesthesiologist: Ronelle Nigh CRNA: Mayme Genta  Additional Notes: 80mL 0.5% Lidocaine. 22g angio d/c'd after injection.

## 2016-03-26 NOTE — Discharge Instructions (Signed)
General Anesthesia, Adult, Care After These instructions provide you with information about caring for yourself after your procedure. Your health care provider may also give you more specific instructions. Your treatment has been planned according to current medical practices, but problems sometimes occur. Call your health care provider if you have any problems or questions after your procedure. What can I expect after the procedure? After the procedure, it is common to have:  Vomiting.  A sore throat.  Mental slowness. It is common to feel:  Nauseous.  Cold or shivery.  Sleepy.  Tired.  Sore or achy, even in parts of your body where you did not have surgery. Follow these instructions at home: For at least 24 hours after the procedure:   Do not:  Participate in activities where you could fall or become injured.  Drive.  Use heavy machinery.  Drink alcohol.  Take sleeping pills or medicines that cause drowsiness.  Make important decisions or sign legal documents.  Take care of children on your own.  Rest. Eating and drinking   If you vomit, drink water, juice, or soup when you can drink without vomiting.  Drink enough fluid to keep your urine clear or pale yellow.  Make sure you have little or no nausea before eating solid foods.  Follow the diet recommended by your health care provider. General instructions   Have a responsible adult stay with you until you are awake and alert.  Return to your normal activities as told by your health care provider. Ask your health care provider what activities are safe for you.  Take over-the-counter and prescription medicines only as told by your health care provider.  If you smoke, do not smoke without supervision.  Keep all follow-up visits as told by your health care provider. This is important. Contact a health care provider if:  You continue to have nausea or vomiting at home, and medicines are not helpful.  You  cannot drink fluids or start eating again.  You cannot urinate after 8-12 hours.  You develop a skin rash.  You have fever.  You have increasing redness at the site of your procedure. Get help right away if:  You have difficulty breathing.  You have chest pain.  You have unexpected bleeding.  You feel that you are having a life-threatening or urgent problem. This information is not intended to replace advice given to you by your health care provider. Make sure you discuss any questions you have with your health care provider. Document Released: 04/07/2000 Document Revised: 06/04/2015 Document Reviewed: 12/14/2014 Elsevier Interactive Patient Education  2017 [place] dressing dry and intact. Keep hand elevated above heart level. May shower after dressing removed on postop day 4 (Sunday). Cover sutures with Band-Aids after drying off. Apply ice to affected area frequently. Take ibuprofen 600 mg TID with meals for 7-10 days, then as necessary. Take ES Tylenol or pain medication as prescribed when needed.  Return for follow-up in 10-14 days or as scheduled.

## 2016-03-27 ENCOUNTER — Encounter: Payer: Self-pay | Admitting: Surgery

## 2016-06-27 ENCOUNTER — Other Ambulatory Visit: Payer: Self-pay | Admitting: Internal Medicine

## 2016-06-27 DIAGNOSIS — Z1231 Encounter for screening mammogram for malignant neoplasm of breast: Secondary | ICD-10-CM

## 2016-07-02 ENCOUNTER — Ambulatory Visit
Admission: RE | Admit: 2016-07-02 | Discharge: 2016-07-02 | Disposition: A | Discharge status: Home / Self Care | Payer: Medicare Other | Source: Ambulatory Visit | Attending: Internal Medicine | Admitting: Internal Medicine

## 2016-07-02 ENCOUNTER — Other Ambulatory Visit (HOSPITAL_COMMUNITY): Payer: Self-pay | Admitting: Internal Medicine

## 2016-07-02 ENCOUNTER — Other Ambulatory Visit: Payer: Self-pay | Admitting: Internal Medicine

## 2016-07-02 DIAGNOSIS — M79604 Pain in right leg: Secondary | ICD-10-CM

## 2016-07-02 DIAGNOSIS — R600 Localized edema: Secondary | ICD-10-CM | POA: Insufficient documentation

## 2016-07-03 ENCOUNTER — Other Ambulatory Visit: Payer: Self-pay | Admitting: Internal Medicine

## 2016-07-03 DIAGNOSIS — R590 Localized enlarged lymph nodes: Secondary | ICD-10-CM

## 2016-07-08 ENCOUNTER — Ambulatory Visit
Admission: RE | Admit: 2016-07-08 | Discharge: 2016-07-08 | Disposition: A | Discharge status: Home / Self Care | Payer: Medicare Other | Source: Ambulatory Visit | Attending: Internal Medicine | Admitting: Internal Medicine

## 2016-07-08 DIAGNOSIS — R590 Localized enlarged lymph nodes: Secondary | ICD-10-CM

## 2016-07-08 DIAGNOSIS — I700 Atherosclerosis of aorta: Secondary | ICD-10-CM | POA: Diagnosis not present

## 2016-07-08 DIAGNOSIS — Z90710 Acquired absence of both cervix and uterus: Secondary | ICD-10-CM | POA: Diagnosis not present

## 2016-07-08 DIAGNOSIS — K5730 Diverticulosis of large intestine without perforation or abscess without bleeding: Secondary | ICD-10-CM | POA: Diagnosis not present

## 2016-07-08 DIAGNOSIS — K429 Umbilical hernia without obstruction or gangrene: Secondary | ICD-10-CM | POA: Diagnosis not present

## 2016-07-08 DIAGNOSIS — Z9049 Acquired absence of other specified parts of digestive tract: Secondary | ICD-10-CM | POA: Insufficient documentation

## 2016-07-08 DIAGNOSIS — M7989 Other specified soft tissue disorders: Secondary | ICD-10-CM | POA: Diagnosis not present

## 2016-07-08 HISTORY — DX: Heart failure, unspecified: I50.9

## 2016-07-08 LAB — POCT I-STAT CREATININE: Creatinine, Ser: 0.6 mg/dL (ref 0.44–1.00)

## 2016-07-08 MED ORDER — IOPAMIDOL (ISOVUE-300) INJECTION 61%: 100.0000 mL | Freq: Once | INTRAVENOUS | Status: DC | PRN

## 2016-07-08 MED ORDER — IOPAMIDOL (ISOVUE-300) INJECTION 61%
100.0000 mL | Freq: Once | INTRAVENOUS | Status: AC | PRN
  Administered 2016-07-08: 100 mL via INTRAVENOUS

## 2016-07-09 ENCOUNTER — Ambulatory Visit
Admission: RE | Admit: 2016-07-09 | Discharge: 2016-07-09 | Disposition: A | Discharge status: Home / Self Care | Payer: Medicare Other | Source: Ambulatory Visit | Attending: Internal Medicine | Admitting: Internal Medicine

## 2016-07-09 DIAGNOSIS — Z1231 Encounter for screening mammogram for malignant neoplasm of breast: Secondary | ICD-10-CM

## 2016-08-05 ENCOUNTER — Encounter (INDEPENDENT_AMBULATORY_CARE_PROVIDER_SITE_OTHER): Payer: Self-pay | Admitting: Vascular Surgery

## 2016-08-15 ENCOUNTER — Encounter (INDEPENDENT_AMBULATORY_CARE_PROVIDER_SITE_OTHER): Payer: Self-pay | Admitting: Vascular Surgery

## 2016-08-15 ENCOUNTER — Ambulatory Visit (INDEPENDENT_AMBULATORY_CARE_PROVIDER_SITE_OTHER): Payer: Medicare Other | Admitting: Vascular Surgery

## 2016-08-15 VITALS — BP 153/72 | HR 76 | Resp 16 | Ht 64.5 in | Wt 172.8 lb

## 2016-08-15 DIAGNOSIS — I10 Essential (primary) hypertension: Secondary | ICD-10-CM

## 2016-08-15 DIAGNOSIS — E782 Mixed hyperlipidemia: Secondary | ICD-10-CM | POA: Diagnosis not present

## 2016-08-15 DIAGNOSIS — M7989 Other specified soft tissue disorders: Secondary | ICD-10-CM | POA: Insufficient documentation

## 2016-08-15 DIAGNOSIS — I5022 Chronic systolic (congestive) heart failure: Secondary | ICD-10-CM

## 2016-08-15 NOTE — Progress Notes (Signed)
Patient ID: Megan Salinas, female   DOB: 10/07/1926, 83 y.o.   MRN: [id number]  Chief Complaint  Patient presents with  . New Evaluation    Edema    HPI Megan Salinas is a 83 y.o. female.  I am asked to see the patient by Alvira Philips for evaluation of leg swelling.  The patient reports About 2-3 months of right lower extremity swelling. This was markedly worsened with an episode of cellulitis for which she received 2 weeks' worth of antibiotics. His help the redness but the swelling persists. The leg is still painful. She has significant varicosities on the left leg but it is not painful. The left leg also does not really have much swelling. She has been wearing her compression stocking with little improvement. She denies a previous history of DVT or superficial thrombophlebitis. No fevers or chills. No nausea or vomiting.   Past Medical History:  Diagnosis Date  . Back pain   . CAD (coronary artery disease)   . CHF (congestive heart failure) ([place])   . GERD (gastroesophageal reflux disease)   . High cholesterol   . Hypertension   . TIA (transient ischemic attack) 01/2016   "possible", no deficits  . Wears hearing aid    bilateral    Past Surgical History:  Procedure Laterality Date  . ABDOMINAL HYSTERECTOMY  1998  . APPENDECTOMY    . baldder tac  1995  . BREAST CYST ASPIRATION Right   . CARPAL TUNNEL RELEASE Right 03/26/2016   Procedure: CARPAL TUNNEL RELEASE ENDOSCOPIC  right;  Surgeon: Corky Mull, MD;  Location: [place];  Service: Orthopedics;  Laterality: Right;  bier block  . CHOLECYSTECTOMY    . CORONARY ARTERY BYPASS GRAFT  02/15/2010   [facility], 3 vessel  . TONSILLECTOMY      Family History  Problem Relation Age of Onset  . Breast cancer Paternal Aunt        53's  . Hypertension Mother   No bleeding disorders, clotting disorders, or aneurysms  Social History Social History  Substance Use Topics  . Smoking status: Former Research scientist (life sciences)  . Smokeless  tobacco: Never Used     Comment: quit over 50 yrs ago  . Alcohol use 3.0 oz/week    5 Glasses of wine per week     Comment:    No IVDU  Allergies  Allergen Reactions  . Sulfa Antibiotics Itching    Red, itchy palms    Current Outpatient Prescriptions  Medication Sig Dispense Refill  . acetaminophen (TYLENOL) 500 MG tablet Take by mouth.    Marland Kitchen amitriptyline (ELAVIL) 25 MG tablet TAKE 1/2 TABLET BY MOUTH AT BEDTIME AS NEEDED    . aspirin EC 81 MG tablet Take by mouth.    Marland Kitchen ibuprofen (ADVIL,MOTRIN) 200 MG tablet Take by mouth.    Marland Kitchen lisinopril (PRINIVIL,ZESTRIL) 20 MG tablet   11  . metoprolol tartrate (LOPRESSOR) 25 MG tablet TAKE 1/2 TABLET BY MOUTH TWICE DAILY    . Multiple Vitamin (MULTI-VITAMINS) TABS Take by mouth.    . nystatin cream (MYCOSTATIN) as needed    . pantoprazole (PROTONIX) 40 MG tablet Take 1 tablet (40 mg total) by mouth 2 (two) times daily. 60 tablet 11  . polyethylene glycol (MIRALAX / GLYCOLAX) packet Take 17 g by mouth daily as needed.    . ranitidine (ZANTAC) 150 MG tablet TAKE 1 TABLET BY MOUTH NIGHTLY    . simvastatin (ZOCOR) 40 MG tablet TAKE 1  TABLET BY MOUTH EVERY DAY    . spironolactone (ALDACTONE) 25 MG tablet TAKE 1/2 TABLET BY MOUTH EVERY DAY    . doxycycline (VIBRAMYCIN) 100 MG capsule Take by mouth.    Marland Kitchen lisinopril (PRINIVIL,ZESTRIL) 10 MG tablet TAKE 1 TABLET BY MOUTH ONCE DAILY    . methylcellulose (CITRUCEL) oral powder Take 1 packet by mouth daily.    . traMADol (ULTRAM) 50 MG tablet Take 1 tablet (50 mg total) by mouth every 6 (six) hours as needed. (Patient not taking: Reported on 08/15/2016) 20 tablet 0   No current facility-administered medications for this visit.       REVIEW OF SYSTEMS (Negative unless checked)  Constitutional: [] Weight loss  [] Fever  [] Chills Cardiac: [] Chest pain   [] Chest pressure   [] Palpitations   [] Shortness of breath when laying flat   [] Shortness of breath at rest   [] Shortness of breath with exertion. Vascular:   [] Pain in legs with walking   [] Pain in legs at rest   [] Pain in legs when laying flat   [] Claudication   [] Pain in feet when walking  [] Pain in feet at rest  [] Pain in feet when laying flat   [] History of DVT   [] Phlebitis   [x] Swelling in legs   [x] Varicose veins   [] Non-healing ulcers Pulmonary:   [] Uses home oxygen   [] Productive cough   [] Hemoptysis   [] Wheeze  [] COPD   [] Asthma Neurologic:  [] Dizziness  [] Blackouts   [] Seizures   [] History of stroke   [x] History of TIA  [] Aphasia   [] Temporary blindness   [] Dysphagia   [] Weakness or numbness in arms   [] Weakness or numbness in legs Musculoskeletal:  [x] Arthritis   [] Joint swelling   [] Joint pain   [x] Low back pain Hematologic:  [] Easy bruising  [] Easy bleeding   [] Hypercoagulable state   [] Anemic  [] Hepatitis Gastrointestinal:  [] Blood in stool   [] Vomiting blood  [] Gastroesophageal reflux/heartburn   [] Abdominal pain Genitourinary:  [] Chronic kidney disease   [] Difficult urination  [] Frequent urination  [] Burning with urination   [] Hematuria Skin:  [] Rashes   [] Ulcers   [] Wounds Psychological:  [] History of anxiety   []  History of major depression.    Physical Exam BP (!) 153/72   Pulse 76   Resp 16   Ht 5' 4.5" (1.638 m)   Wt 172 lb 12.8 oz (78.4 kg)   BMI 29.20 kg/m  Gen:  WD/WN, NAD. Appears younger than stated age. Head: [place]/AT, No temporalis wasting.  Ear/Nose/Throat: Hearing diminished, nares w/o erythema or drainage, oropharynx w/o Erythema/Exudate Eyes: Conjunctiva clear, sclera non-icteric  Neck: trachea midline.  No JVD.  Pulmonary:  Good air movement, respirations not labored, no use of accessory muscles Cardiac: RRR, normal S1, S2 Vascular:  Vessel Right Left  Radial Palpable Palpable                      Popliteal Not Palpable 1+ Palpable  PT Not Palpable 1+ Palpable  DP 1+ Palpable Palpable    Musculoskeletal: M/S 5/5 throughout.  Extremities without ischemic changes.  No deformity or atrophy. 2+ right  lower extremity edema, minimal left lower extremity edema. Stasis dermatitis changes are present in the right leg. Prominent varicosities are present in the left leg but not much swelling or stasis changes. Neurologic: Sensation grossly intact in extremities.  Symmetrical.  Speech is fluent. Motor exam as listed above. Psychiatric: Judgment intact, Mood & affect appropriate for pt's clinical situation. Dermatologic: No rashes or ulcers noted.  No cellulitis or open wounds.    Radiology No results found.  Labs Recent Results (from the past 2160 hour(s))  I-STAT creatinine     Status: None   Collection Time: 07/08/16  9:18 AM  Result Value Ref Range   Creatinine, Ser 0.60 0.44 - 1.00 mg/dL    Assessment/Plan:  Benign essential hypertension blood pressure control important in reducing the progression of atherosclerotic disease. On appropriate oral medications.   Mixed hyperlipidemia lipid control important in reducing the progression of atherosclerotic disease. Continue statin therapy   Chronic systolic CHF (congestive heart failure), NYHA class 3 (HCC) Could certainly contribute to lower extremity swelling although if it were solely from heart or medical disease would expect it to be more symmetric and bilateral.  Swelling of limb I have had a long discussion with the patient regarding swelling and why it  causes symptoms.  Patient will begin wearing graduated compression stockings class 1 (20-30 mmHg) on a daily basis a prescription was given. The patient will  beginning wearing the stockings first thing in the morning and removing them in the evening. The patient is instructed specifically not to sleep in the stockings.   In addition, behavioral modification will be initiated.  This will include frequent elevation, use of over the counter pain medications and exercise such as walking.  I have reviewed systemic causes for chronic edema such as liver, kidney and cardiac etiologies.   The patient denies problems with these organ systems.    Consideration for a lymph pump will also be made based upon the effectiveness of conservative therapy.  This would help to improve the edema control and prevent sequela such as ulcers and infections   Patient should undergo duplex ultrasound of the venous system to ensure that DVT or reflux is not present.  The patient will follow-up with me after the ultrasound.        Leotis Pain 08/15/2016, 1:04 PM   This note was created with Dragon medical transcription system.  Any errors from dictation are unintentional.

## 2016-08-15 NOTE — Patient Instructions (Signed)

## 2016-08-15 NOTE — Assessment & Plan Note (Signed)
Could certainly contribute to lower extremity swelling although if it were solely from heart or medical disease would expect it to be more symmetric and bilateral.

## 2016-08-15 NOTE — Assessment & Plan Note (Signed)

## 2016-08-15 NOTE — Assessment & Plan Note (Signed)
blood pressure control important in reducing the progression of atherosclerotic disease. On appropriate oral medications.  

## 2016-08-15 NOTE — Assessment & Plan Note (Signed)
lipid control important in reducing the progression of atherosclerotic disease. Continue statin therapy  

## 2016-08-18 ENCOUNTER — Ambulatory Visit (INDEPENDENT_AMBULATORY_CARE_PROVIDER_SITE_OTHER): Payer: Medicare Other

## 2016-08-18 DIAGNOSIS — M7989 Other specified soft tissue disorders: Secondary | ICD-10-CM

## 2016-09-02 ENCOUNTER — Ambulatory Visit (INDEPENDENT_AMBULATORY_CARE_PROVIDER_SITE_OTHER): Payer: Medicare Other | Admitting: Vascular Surgery

## 2016-09-02 ENCOUNTER — Encounter (INDEPENDENT_AMBULATORY_CARE_PROVIDER_SITE_OTHER): Payer: Self-pay | Admitting: Vascular Surgery

## 2016-09-02 VITALS — BP 160/70 | HR 75 | Resp 16 | Wt 171.0 lb

## 2016-09-02 DIAGNOSIS — I10 Essential (primary) hypertension: Secondary | ICD-10-CM | POA: Diagnosis not present

## 2016-09-02 DIAGNOSIS — M7989 Other specified soft tissue disorders: Secondary | ICD-10-CM | POA: Diagnosis not present

## 2016-09-02 DIAGNOSIS — I872 Venous insufficiency (chronic) (peripheral): Secondary | ICD-10-CM | POA: Diagnosis not present

## 2016-09-02 DIAGNOSIS — E782 Mixed hyperlipidemia: Secondary | ICD-10-CM | POA: Diagnosis not present

## 2016-09-02 NOTE — Assessment & Plan Note (Signed)
Her venous duplex today demonstrates no DVT or superficial thrombophlebitis. She has reflux throughout the deep venous system as well as the great saphenous vein and the saphenofemoral junction. We had a long discussion today regarding her disease. She needs to get compression stockings and begin wearing them daily.  Prescription was given today. She should try these for 3 months and then we can reassess her for the need for invasive therapy following an appropriate trial of conservative therapy including increased activity, leg elevation, and compression stocking use regularly.

## 2016-09-02 NOTE — Patient Instructions (Signed)

## 2016-09-02 NOTE — Progress Notes (Signed)
MRN : [id number]  Megan Salinas is a 83 y.o. ([birth year]) female who presents with chief complaint of  Chief Complaint  Patient presents with  . Follow-up  .  History of Present Illness: Patient returns today in follow up of Leg swelling. She has not yet gotten her compression stockings. She says her legs are the same as they were at her first visit. Her venous duplex today demonstrates no DVT or superficial thrombophlebitis. She has reflux throughout the deep venous system as well as the great saphenous vein and the saphenofemoral junction.       Past Medical History:  Diagnosis Date  . Back pain   . CAD (coronary artery disease)   . CHF (congestive heart failure) ([place])   . GERD (gastroesophageal reflux disease)   . High cholesterol   . Hypertension   . TIA (transient ischemic attack) 01/2016   "possible", no deficits  . Wears hearing aid    bilateral         Past Surgical History:  Procedure Laterality Date  . ABDOMINAL HYSTERECTOMY  1998  . APPENDECTOMY    . baldder tac  1995  . BREAST CYST ASPIRATION Right   . CARPAL TUNNEL RELEASE Right 03/26/2016   Procedure: CARPAL TUNNEL RELEASE ENDOSCOPIC  right;  Surgeon: Corky Mull, MD;  Location: [place];  Service: Orthopedics;  Laterality: Right;  bier block  . CHOLECYSTECTOMY    . CORONARY ARTERY BYPASS GRAFT  02/15/2010   [facility], 3 vessel  . TONSILLECTOMY           Family History  Problem Relation Age of Onset  . Breast cancer Paternal Aunt        81's  . Hypertension Mother   No bleeding disorders, clotting disorders, or aneurysms  Social History        Social History   Substance Use Topics   . Smoking status: Former Research scientist (life sciences)   . Smokeless tobacco: Never Used      Comment: quit over 50 yrs ago   . Alcohol use 3.0 oz/week    5 Glasses of wine per week     Comment:     No IVDU       Allergies  Allergen Reactions  . Sulfa Antibiotics Itching    Red,  itchy palms          Current Outpatient Prescriptions  Medication Sig Dispense Refill  . acetaminophen (TYLENOL) 500 MG tablet Take by mouth.    Marland Kitchen amitriptyline (ELAVIL) 25 MG tablet TAKE 1/2 TABLET BY MOUTH AT BEDTIME AS NEEDED    . aspirin EC 81 MG tablet Take by mouth.    Marland Kitchen ibuprofen (ADVIL,MOTRIN) 200 MG tablet Take by mouth.    Marland Kitchen lisinopril (PRINIVIL,ZESTRIL) 20 MG tablet   11  . metoprolol tartrate (LOPRESSOR) 25 MG tablet TAKE 1/2 TABLET BY MOUTH TWICE DAILY    . Multiple Vitamin (MULTI-VITAMINS) TABS Take by mouth.    . nystatin cream (MYCOSTATIN) as needed    . pantoprazole (PROTONIX) 40 MG tablet Take 1 tablet (40 mg total) by mouth 2 (two) times daily. 60 tablet 11  . polyethylene glycol (MIRALAX / GLYCOLAX) packet Take 17 g by mouth daily as needed.    . ranitidine (ZANTAC) 150 MG tablet TAKE 1 TABLET BY MOUTH NIGHTLY    . simvastatin (ZOCOR) 40 MG tablet TAKE 1 TABLET BY MOUTH EVERY DAY    . spironolactone (ALDACTONE) 25 MG tablet TAKE 1/2 TABLET BY MOUTH  EVERY DAY    . doxycycline (VIBRAMYCIN) 100 MG capsule Take by mouth.    Marland Kitchen lisinopril (PRINIVIL,ZESTRIL) 10 MG tablet TAKE 1 TABLET BY MOUTH ONCE DAILY    . methylcellulose (CITRUCEL) oral powder Take 1 packet by mouth daily.    . traMADol (ULTRAM) 50 MG tablet Take 1 tablet (50 mg total) by mouth every 6 (six) hours as needed. (Patient not taking: Reported on 08/15/2016) 20 tablet 0   No current facility-administered medications for this visit.       REVIEW OF SYSTEMS (Negative unless checked)  Constitutional: [] Weight loss  [] Fever  [] Chills Cardiac: [] Chest pain   [] Chest pressure   [] Palpitations   [] Shortness of breath when laying flat   [] Shortness of breath at rest   [] Shortness of breath with exertion. Vascular:  [] Pain in legs with walking   [] Pain in legs at rest   [] Pain in legs when laying flat   [] Claudication   [] Pain in feet when walking  [] Pain in feet at rest  [] Pain  in feet when laying flat   [] History of DVT   [] Phlebitis   [x] Swelling in legs   [x] Varicose veins   [] Non-healing ulcers Pulmonary:   [] Uses home oxygen   [] Productive cough   [] Hemoptysis   [] Wheeze  [] COPD   [] Asthma Neurologic:  [] Dizziness  [] Blackouts   [] Seizures   [] History of stroke   [x] History of TIA  [] Aphasia   [] Temporary blindness   [] Dysphagia   [] Weakness or numbness in arms   [] Weakness or numbness in legs Musculoskeletal:  [x] Arthritis   [] Joint swelling   [] Joint pain   [x] Low back pain Hematologic:  [] Easy bruising  [] Easy bleeding   [] Hypercoagulable state   [] Anemic  [] Hepatitis Gastrointestinal:  [] Blood in stool   [] Vomiting blood  [] Gastroesophageal reflux/heartburn   [] Abdominal pain Genitourinary:  [] Chronic kidney disease   [] Difficult urination  [] Frequent urination  [] Burning with urination   [] Hematuria Skin:  [] Rashes   [] Ulcers   [] Wounds Psychological:  [] History of anxiety   []  History of major depression.     Physical Examination  BP (!) 160/70   Pulse 75   Resp 16   Wt 171 lb (77.6 kg)   BMI 28.90 kg/m  Gen:  WD/WN, NAD. Appears younger than stated age Head: [place]/AT, No temporalis wasting. Ear/Nose/Throat: Hearing grossly intact, nares w/o erythema or drainage, trachea midline Eyes: Conjunctiva clear. Sclera non-icteric Neck: Supple.  No JVD.  Pulmonary:  Good air movement, no use of accessory muscles.  Cardiac: Irregular Vascular:  Vessel Right Left  Radial Palpable Palpable                                    Musculoskeletal: M/S 5/5 throughout.  No deformity or atrophy. 2+ RLE edema, 1+ LLE edema. Neurologic: Sensation grossly intact in extremities.  Symmetrical.  Speech is fluent.  Psychiatric: Judgment intact, Mood & affect appropriate for pt's clinical situation. Dermatologic: No rashes or ulcers noted.  No cellulitis or open wounds.       Labs Recent Results (from the past 2160 hour(s))  I-STAT creatinine     Status:  None   Collection Time: 07/08/16  9:18 AM  Result Value Ref Range   Creatinine, Ser 0.60 0.44 - 1.00 mg/dL    Radiology No results found.   Assessment/Plan Benign essential hypertension blood pressure control important in reducing the progression of atherosclerotic disease. On  appropriate oral medications.   Mixed hyperlipidemia lipid control important in reducing the progression of atherosclerotic disease. Continue statin therapy   Chronic systolic CHF (congestive heart failure), NYHA class 3 (HCC) Could certainly contribute to lower extremity swelling although if it were solely from heart or medical disease would expect it to be more symmetric and bilateral.  Chronic venous insufficiency Her venous duplex today demonstrates no DVT or superficial thrombophlebitis. She has reflux throughout the deep venous system as well as the great saphenous vein and the saphenofemoral junction. We had a long discussion today regarding her disease. She needs to get compression stockings and begin wearing them daily.  Prescription was given today. She should try these for 3 months and then we can reassess her for the need for invasive therapy following an appropriate trial of conservative therapy including increased activity, leg elevation, and compression stocking use regularly.    Leotis Pain, MD  09/02/2016 3:00 PM    This note was created with Dragon medical transcription system.  Any errors from dictation are purely unintentional

## 2016-11-03 ENCOUNTER — Other Ambulatory Visit: Payer: Self-pay | Admitting: Gastroenterology

## 2016-12-09 ENCOUNTER — Encounter (INDEPENDENT_AMBULATORY_CARE_PROVIDER_SITE_OTHER): Payer: Self-pay | Admitting: Vascular Surgery

## 2016-12-09 ENCOUNTER — Ambulatory Visit (INDEPENDENT_AMBULATORY_CARE_PROVIDER_SITE_OTHER): Payer: Medicare Other | Admitting: Vascular Surgery

## 2016-12-09 ENCOUNTER — Encounter (INDEPENDENT_AMBULATORY_CARE_PROVIDER_SITE_OTHER): Payer: Self-pay

## 2016-12-09 VITALS — BP 150/72 | HR 70 | Resp 16 | Ht 63.0 in | Wt 171.0 lb

## 2016-12-09 DIAGNOSIS — Z8639 Personal history of other endocrine, nutritional and metabolic disease: Secondary | ICD-10-CM

## 2016-12-09 DIAGNOSIS — I10 Essential (primary) hypertension: Secondary | ICD-10-CM | POA: Diagnosis not present

## 2016-12-09 DIAGNOSIS — M7989 Other specified soft tissue disorders: Secondary | ICD-10-CM

## 2016-12-09 DIAGNOSIS — I872 Venous insufficiency (chronic) (peripheral): Secondary | ICD-10-CM

## 2016-12-09 NOTE — Assessment & Plan Note (Signed)
blood pressure control important in reducing the progression of atherosclerotic disease. On appropriate oral medications.  

## 2016-12-09 NOTE — Progress Notes (Signed)
Patient ID: Megan Salinas, female   DOB: November 23, 1926, 83 y.o.   MRN: [id number]  Chief Complaint  Patient presents with  . Follow-up    11mos. no studies    HPI Megan Salinas is a 83 y.o. female.  Patient returns in follow up of their venous disease.  They have done their best to comply with the prescribed conservative therapies of compression stockings, leg elevation, and exercise.  She is doing well and has noticed improvement in terms of the discomfort in her legs as well as the swelling in her legs.  She denies any major issues or problems since her last visit.  She has been wearing the compression stockings regularly and thinks this is what has helped the most.   Past Medical History:  Diagnosis Date  . Back pain   . CAD (coronary artery disease)   . CHF (congestive heart failure) ([place])   . GERD (gastroesophageal reflux disease)   . High cholesterol   . Hypertension   . TIA (transient ischemic attack) 01/2016   "possible", no deficits  . Wears hearing aid    bilateral    Past Surgical History:  Procedure Laterality Date  . ABDOMINAL HYSTERECTOMY  1998  . APPENDECTOMY    . baldder tac  1995  . BREAST CYST ASPIRATION Right   . CARPAL TUNNEL RELEASE Right 03/26/2016   Procedure: CARPAL TUNNEL RELEASE ENDOSCOPIC  right;  Surgeon: Corky Mull, MD;  Location: [place];  Service: Orthopedics;  Laterality: Right;  bier block  . CHOLECYSTECTOMY    . CORONARY ARTERY BYPASS GRAFT  02/15/2010   [facility], 3 vessel  . TONSILLECTOMY      Family History  Problem Relation Age of Onset  . Breast cancer Paternal Aunt        86's  . Hypertension Mother     Social History Social History   Tobacco Use  . Smoking status: Former Research scientist (life sciences)  . Smokeless tobacco: Never Used  . Tobacco comment: quit over 50 yrs ago  Substance Use Topics  . Alcohol use: Yes    Alcohol/week: 3.0 oz    Types: 5 Glasses of wine per week    Comment:    . Drug use: No    Allergies    Allergen Reactions  . Sulfa Antibiotics Itching    Red, itchy palms    Current Outpatient Medications  Medication Sig Dispense Refill  . acetaminophen (TYLENOL) 500 MG tablet Take by mouth.    Marland Kitchen amitriptyline (ELAVIL) 25 MG tablet TAKE 1/2 TABLET BY MOUTH AT BEDTIME AS NEEDED    . aspirin EC 81 MG tablet Take by mouth.    Marland Kitchen ibuprofen (ADVIL,MOTRIN) 200 MG tablet Take by mouth.    Marland Kitchen lisinopril (PRINIVIL,ZESTRIL) 10 MG tablet TAKE 1 TABLET BY MOUTH ONCE DAILY    . lisinopril (PRINIVIL,ZESTRIL) 20 MG tablet   11  . methylcellulose (CITRUCEL) oral powder Take 1 packet by mouth daily.    . metoprolol tartrate (LOPRESSOR) 25 MG tablet TAKE 1/2 TABLET BY MOUTH TWICE DAILY    . Multiple Vitamin (MULTI-VITAMINS) TABS Take by mouth.    . nystatin cream (MYCOSTATIN) as needed    . pantoprazole (PROTONIX) 40 MG tablet Take 1 tablet (40 mg total) by mouth 2 (two) times daily. 60 tablet 11  . polyethylene glycol (MIRALAX / GLYCOLAX) packet Take 17 g by mouth daily as needed.    . ranitidine (ZANTAC) 150 MG tablet TAKE 1 TABLET BY  MOUTH NIGHTLY    . simvastatin (ZOCOR) 40 MG tablet TAKE 1 TABLET BY MOUTH EVERY DAY    . spironolactone (ALDACTONE) 25 MG tablet TAKE 1/2 TABLET BY MOUTH EVERY DAY    . traMADol (ULTRAM) 50 MG tablet Take 1 tablet (50 mg total) by mouth every 6 (six) hours as needed. 20 tablet 0   No current facility-administered medications for this visit.       REVIEW OF SYSTEMS (Negative unless checked)  Constitutional: [] Weight loss  [] Fever  [] Chills Cardiac: [] Chest pain   [] Chest pressure   [] Palpitations   [] Shortness of breath when laying flat   [] Shortness of breath at rest   [] Shortness of breath with exertion. Vascular:  [] Pain in legs with walking   [] Pain in legs at rest   [] Pain in legs when laying flat   [] Claudication   [] Pain in feet when walking  [] Pain in feet at rest  [] Pain in feet when laying flat   [] History of DVT   [] Phlebitis   [x] Swelling in legs    [x] Varicose veins   [] Non-healing ulcers Pulmonary:   [] Uses home oxygen   [] Productive cough   [] Hemoptysis   [] Wheeze  [] COPD   [] Asthma Neurologic:  [] Dizziness  [] Blackouts   [] Seizures   [] History of stroke   [] History of TIA  [] Aphasia   [] Temporary blindness   [] Dysphagia   [] Weakness or numbness in arms   [] Weakness or numbness in legs Musculoskeletal:  [x] Arthritis   [] Joint swelling   [] Joint pain   [] Low back pain Hematologic:  [] Easy bruising  [] Easy bleeding   [] Hypercoagulable state   [] Anemic  [] Hepatitis Gastrointestinal:  [] Blood in stool   [] Vomiting blood  [] Gastroesophageal reflux/heartburn   [] Abdominal pain Genitourinary:  [] Chronic kidney disease   [] Difficult urination  [] Frequent urination  [] Burning with urination   [] Hematuria Skin:  [] Rashes   [] Ulcers   [] Wounds Psychological:  [] History of anxiety   []  History of major depression.    Physical Exam BP (!) 150/72 (BP Location: Right Arm)   Pulse 70   Resp 16   Ht 5\' 3"  (1.6 m)   Wt 171 lb (77.6 kg)   BMI 30.29 kg/m  Gen:  WD/WN, NAD. Appears younger than stated age. Head: [place]/AT, No temporalis wasting.  Ear/Nose/Throat: Hearing grossly intact Eyes: Sclera non-icteric. Conjunctiva clear Neck: Supple. Trachea midline Pulmonary:  Good air movement, no use of accessory muscles, respirations not labored.  Cardiac: RRR, No JVD Vascular: Varicosities diffuse and measuring up to 3 mm in the right lower extremity        Varicosities scattered and measuring up to 2 mm in the left lower extremity Vessel Right Left  Radial Palpable Palpable                                   Gastrointestinal: soft, non-tender/non-distended. No guarding/reflex. No masses, surgical incisions, or scars. Musculoskeletal: M/S 5/5 throughout.   1 + RLE edema.  Trace LLE edema Neurologic: Sensation grossly intact in extremities.  Symmetrical.  Speech is fluent.  Psychiatric: Judgment intact, Mood & affect appropriate for pt's  clinical situation. Dermatologic: No rashes or ulcers noted.  No cellulitis or open wounds.    Radiology No results found.  Labs No results found for this or any previous visit (from the past 2160 hour(s)).  Assessment/Plan:  Benign essential hypertension blood pressure control important in reducing the progression of atherosclerotic  disease. On appropriate oral medications.   Swelling of limb Improved with conservative management  History of type 2 diabetes mellitus blood glucose control important in reducing the progression of atherosclerotic disease. Also, involved in wound healing. On appropriate medications.   Chronic venous insufficiency The patient has had significant improvement with conservative therapy so we will continue this.  No intervention planned at this time.  Recheck in 6 months.       Leotis Pain 12/09/2016, 1:48 PM

## 2016-12-09 NOTE — Patient Instructions (Signed)

## 2016-12-09 NOTE — Assessment & Plan Note (Signed)
blood glucose control important in reducing the progression of atherosclerotic disease. Also, involved in wound healing. On appropriate medications.  

## 2016-12-09 NOTE — Assessment & Plan Note (Signed)
The patient has had significant improvement with conservative therapy so we will continue this.  No intervention planned at this time.  Recheck in 6 months.

## 2016-12-09 NOTE — Assessment & Plan Note (Signed)
Improved with conservative management

## 2017-02-28 ENCOUNTER — Other Ambulatory Visit: Payer: Self-pay

## 2017-02-28 ENCOUNTER — Emergency Department: Payer: Medicare Other

## 2017-02-28 ENCOUNTER — Observation Stay
Admission: EM | Admit: 2017-02-28 | Discharge: 2017-03-01 | Disposition: A | Discharge status: Home / Self Care | Payer: Medicare Other | Attending: Internal Medicine | Admitting: Internal Medicine

## 2017-02-28 DIAGNOSIS — Z8673 Personal history of transient ischemic attack (TIA), and cerebral infarction without residual deficits: Secondary | ICD-10-CM | POA: Insufficient documentation

## 2017-02-28 DIAGNOSIS — Z79891 Long term (current) use of opiate analgesic: Secondary | ICD-10-CM | POA: Insufficient documentation

## 2017-02-28 DIAGNOSIS — Z79899 Other long term (current) drug therapy: Secondary | ICD-10-CM | POA: Diagnosis not present

## 2017-02-28 DIAGNOSIS — Z951 Presence of aortocoronary bypass graft: Secondary | ICD-10-CM | POA: Diagnosis not present

## 2017-02-28 DIAGNOSIS — I509 Heart failure, unspecified: Secondary | ICD-10-CM | POA: Insufficient documentation

## 2017-02-28 DIAGNOSIS — Z87891 Personal history of nicotine dependence: Secondary | ICD-10-CM | POA: Diagnosis not present

## 2017-02-28 DIAGNOSIS — K219 Gastro-esophageal reflux disease without esophagitis: Secondary | ICD-10-CM | POA: Insufficient documentation

## 2017-02-28 DIAGNOSIS — R079 Chest pain, unspecified: Principal | ICD-10-CM | POA: Insufficient documentation

## 2017-02-28 DIAGNOSIS — I110 Hypertensive heart disease with heart failure: Secondary | ICD-10-CM | POA: Diagnosis not present

## 2017-02-28 DIAGNOSIS — E782 Mixed hyperlipidemia: Secondary | ICD-10-CM | POA: Diagnosis not present

## 2017-02-28 DIAGNOSIS — E7800 Pure hypercholesterolemia, unspecified: Secondary | ICD-10-CM | POA: Insufficient documentation

## 2017-02-28 DIAGNOSIS — Z791 Long term (current) use of non-steroidal anti-inflammatories (NSAID): Secondary | ICD-10-CM | POA: Diagnosis not present

## 2017-02-28 DIAGNOSIS — Z7982 Long term (current) use of aspirin: Secondary | ICD-10-CM | POA: Diagnosis not present

## 2017-02-28 DIAGNOSIS — I2510 Atherosclerotic heart disease of native coronary artery without angina pectoris: Secondary | ICD-10-CM | POA: Diagnosis present

## 2017-02-28 DIAGNOSIS — I10 Essential (primary) hypertension: Secondary | ICD-10-CM | POA: Diagnosis present

## 2017-02-28 LAB — TROPONIN I
Troponin I: <0.03 ng/mL (ref <0.03)
Troponin I: <0.03 ng/mL (ref <0.03)
Troponin I: <0.03 ng/mL (ref <0.03)
Troponin I: <0.03 ng/mL (ref <0.03)

## 2017-02-28 LAB — BASIC METABOLIC PANEL WITH GFR
Anion gap: 9 (ref 5–15)
BUN: 21 mg/dL — ABNORMAL HIGH (ref 6–20)
CO2: 24 mmol/L (ref 22–32)
Calcium: 9.7 mg/dL (ref 8.9–10.3)
Chloride: 105 mmol/L (ref 101–111)
Creatinine, Ser: 0.75 mg/dL (ref 0.44–1.00)
GFR calc Af Amer: >60 mL/min (ref >60)
GFR calc non Af Amer: >60 mL/min (ref >60)
Glucose, Bld: 122 mg/dL — ABNORMAL HIGH (ref 65–99)
Potassium: 4.8 mmol/L (ref 3.5–5.1)
Sodium: 138 mmol/L (ref 135–145)

## 2017-02-28 LAB — HEPATIC FUNCTION PANEL
ALT: 20 U/L (ref 14–54)
AST: 24 U/L (ref 15–41)
Albumin: 4.1 g/dL (ref 3.5–5.0)
Alkaline Phosphatase: 66 U/L (ref 38–126)
Bilirubin, Direct: 0.2 mg/dL (ref 0.1–0.5)
Indirect Bilirubin: 1 mg/dL — ABNORMAL HIGH (ref 0.3–0.9)
Total Bilirubin: 1.2 mg/dL (ref 0.3–1.2)
Total Protein: 6.9 g/dL (ref 6.5–8.1)

## 2017-02-28 LAB — CBC
HCT: 41.6 % (ref 35.0–47.0)
Hemoglobin: 13.9 g/dL (ref 12.0–16.0)
MCH: 31.7 pg (ref 26.0–34.0)
MCHC: 33.5 g/dL (ref 32.0–36.0)
MCV: 94.7 fL (ref 80.0–100.0)
Platelets: 240 K/uL (ref 150–440)
RBC: 4.39 MIL/uL (ref 3.80–5.20)
RDW: 14.1 % (ref 11.5–14.5)
WBC: 7 K/uL (ref 3.6–11.0)

## 2017-02-28 LAB — LIPASE, BLOOD: Lipase: 28 U/L (ref 11–51)

## 2017-02-28 MED ORDER — SIMVASTATIN 20 MG PO TABS
40.0000 mg | ORAL_TABLET | Freq: Every day | ORAL | Status: DC
  Administered 2017-02-28: 40 mg via ORAL
  Filled 2017-02-28: qty 2

## 2017-02-28 MED ORDER — ENOXAPARIN SODIUM 40 MG/0.4ML ~~LOC~~ SOLN
40.0000 mg | SUBCUTANEOUS | Status: DC
  Filled 2017-02-28: qty 0.4

## 2017-02-28 MED ORDER — METOPROLOL TARTRATE 25 MG PO TABS
12.5000 mg | ORAL_TABLET | Freq: Two times a day (BID) | ORAL | Status: DC
  Administered 2017-02-28 – 2017-03-01 (×2): 12.5 mg via ORAL
  Filled 2017-02-28 (×2): qty 1

## 2017-02-28 MED ORDER — ACETAMINOPHEN 325 MG PO TABS: 650.0000 mg | ORAL_TABLET | Freq: Four times a day (QID) | ORAL | Status: DC | PRN

## 2017-02-28 MED ORDER — SODIUM CHLORIDE 0.9 % IV SOLN
INTRAVENOUS | Status: DC
  Administered 2017-02-28 – 2017-03-01 (×2): via INTRAVENOUS

## 2017-02-28 MED ORDER — DOCUSATE SODIUM 100 MG PO CAPS
100.0000 mg | ORAL_CAPSULE | Freq: Two times a day (BID) | ORAL | Status: DC
  Filled 2017-02-28 (×2): qty 1

## 2017-02-28 MED ORDER — LORAZEPAM 2 MG/ML IJ SOLN: 0.5000 mg | INTRAMUSCULAR | Status: DC | PRN

## 2017-02-28 MED ORDER — NITROGLYCERIN 0.4 MG SL SUBL: 0.4000 mg | SUBLINGUAL_TABLET | SUBLINGUAL | Status: DC | PRN

## 2017-02-28 MED ORDER — BISACODYL 10 MG RE SUPP: 10.0000 mg | Freq: Every day | RECTAL | Status: DC | PRN

## 2017-02-28 MED ORDER — TRAMADOL HCL 50 MG PO TABS: 50.0000 mg | ORAL_TABLET | Freq: Four times a day (QID) | ORAL | Status: DC | PRN

## 2017-02-28 MED ORDER — MORPHINE SULFATE (PF) 2 MG/ML IV SOLN: 2.0000 mg | INTRAVENOUS | Status: DC | PRN

## 2017-02-28 MED ORDER — ASPIRIN EC 81 MG PO TBEC
81.0000 mg | DELAYED_RELEASE_TABLET | Freq: Every day | ORAL | Status: DC
  Administered 2017-02-28: 81 mg via ORAL
  Filled 2017-02-28: qty 1

## 2017-02-28 MED ORDER — PANTOPRAZOLE SODIUM 40 MG PO TBEC
40.0000 mg | DELAYED_RELEASE_TABLET | Freq: Every day | ORAL | Status: DC
  Administered 2017-03-01: 40 mg via ORAL
  Filled 2017-02-28: qty 1

## 2017-02-28 MED ORDER — FAMOTIDINE 20 MG PO TABS
20.0000 mg | ORAL_TABLET | Freq: Every day | ORAL | Status: DC
  Administered 2017-02-28: 20 mg via ORAL
  Filled 2017-02-28: qty 1

## 2017-02-28 MED ORDER — AMITRIPTYLINE HCL 25 MG PO TABS
12.5000 mg | ORAL_TABLET | Freq: Every day | ORAL | Status: DC
  Administered 2017-02-28: 12.5 mg via ORAL
  Filled 2017-02-28: qty 1

## 2017-02-28 MED ORDER — ONDANSETRON HCL 4 MG PO TABS: 4.0000 mg | ORAL_TABLET | Freq: Four times a day (QID) | ORAL | Status: DC | PRN

## 2017-02-28 MED ORDER — ONDANSETRON HCL 4 MG/2ML IJ SOLN: 4.0000 mg | Freq: Four times a day (QID) | INTRAMUSCULAR | Status: DC | PRN

## 2017-02-28 MED ORDER — SPIRONOLACTONE 12.5 MG HALF TABLET
12.5000 mg | ORAL_TABLET | Freq: Every day | ORAL | Status: DC
  Administered 2017-03-01: 12.5 mg via ORAL
  Filled 2017-02-28 (×2): qty 1

## 2017-02-28 MED ORDER — LISINOPRIL 20 MG PO TABS
20.0000 mg | ORAL_TABLET | Freq: Every day | ORAL | Status: DC
  Administered 2017-03-01: 20 mg via ORAL
  Filled 2017-02-28: qty 1

## 2017-02-28 MED ORDER — ACETAMINOPHEN 650 MG RE SUPP: 650.0000 mg | Freq: Four times a day (QID) | RECTAL | Status: DC | PRN

## 2017-02-28 NOTE — ED Notes (Signed)
Pt given warm blankets and TV turned on. Pt resting comfortably.

## 2017-02-28 NOTE — ED Notes (Signed)
Attempted to call report. Nurse on floor unavailable at this time.

## 2017-02-28 NOTE — ED Notes (Signed)
Pt in NAD at this time. Nurse assisted pt to the toilet. Pt normally ambulates with cane. Pt doing well with one assist. Pt stating CP that comes and goes and moves around in her chest. Pt denying n/v.

## 2017-02-28 NOTE — ED Notes (Signed)
Received report from [place], Therapist, sports. Pt is currently in Xray.

## 2017-02-28 NOTE — ED Triage Notes (Signed)
Pt came to ED via EMS from home. Pt reports woke up at 7am w/ chest pain. Has been intermittent since then. Substernal, radiating to neck. Took 2 325asa this morning. History of triple bypass.

## 2017-02-28 NOTE — ED Notes (Signed)
Pt helped to the toilet. Pt in NAD at this time. Pt is aware that she is being admitted and waiting for room.

## 2017-02-28 NOTE — ED Notes (Signed)
Nurse assisted pt to the restroom. Pt tolerated well. Pt in NAD at this time.

## 2017-02-28 NOTE — H&P (Signed)
History and Physical    Megan Salinas DGL:[id number] DOB: 1926/08/16 DOA: 02/28/2017  Referring physician: Dr. Jacqualine Code PCP: Madelyn Brunner, MD  Specialists: Dr. Nehemiah Massed  Chief Complaint: chest pain  HPI: Megan Salinas is a 84 y.o. female has a past medical history significant for HTN, HLD, and CAD s/p CABG now with recurrent episodes of CP described as aching lasting < 5 minutes. No N/V, SOB, or sweats. Pain does not radiate. In ER, EKG and CXR OK. Troponin normal. She is afraid to go home given her cardiac hx.  Review of Systems: The patient denies anorexia, fever, weight loss,, vision loss, decreased hearing, hoarseness,  syncope, dyspnea on exertion, peripheral edema, balance deficits, hemoptysis, abdominal pain, melena, hematochezia, severe indigestion/heartburn, hematuria, incontinence, genital sores, muscle weakness, suspicious skin lesions, transient blindness, difficulty walking, depression, unusual weight change, abnormal bleeding, enlarged lymph nodes, angioedema, and breast masses.   Past Medical History:  Diagnosis Date  . Back pain   . CAD (coronary artery disease)   . CHF (congestive heart failure) ([place])   . GERD (gastroesophageal reflux disease)   . High cholesterol   . Hypertension   . TIA (transient ischemic attack) 01/2016   "possible", no deficits  . Wears hearing aid    bilateral   Past Surgical History:  Procedure Laterality Date  . ABDOMINAL HYSTERECTOMY  1998  . APPENDECTOMY    . baldder tac  1995  . BREAST CYST ASPIRATION Right   . CARPAL TUNNEL RELEASE Right 03/26/2016   Procedure: CARPAL TUNNEL RELEASE ENDOSCOPIC  right;  Surgeon: Corky Mull, MD;  Location: [place];  Service: Orthopedics;  Laterality: Right;  bier block  . CHOLECYSTECTOMY    . CORONARY ARTERY BYPASS GRAFT  02/15/2010   [facility], 3 vessel  . TONSILLECTOMY     Social History:  reports that she has quit smoking. she has never used smokeless tobacco. She reports that she  drinks about 3.0 oz of alcohol per week. She reports that she does not use drugs.  Allergies  Allergen Reactions  . Sulfa Antibiotics Itching    Red, itchy palms    Family History  Problem Relation Age of Onset  . Breast cancer Paternal Aunt        10's  . Hypertension Mother     Prior to Admission medications   Medication Sig Start Date End Date Taking? Authorizing Provider  acetaminophen (TYLENOL) 500 MG tablet Take 500 mg by mouth every 6 (six) hours as needed for mild pain.    Yes [provider]  amitriptyline (ELAVIL) 25 MG tablet TAKE 1/2 TABLET BY MOUTH AT BEDTIME AS NEEDED 09/12/15  Yes [provider]  aspirin EC 81 MG tablet Take 81 mg by mouth at bedtime.    Yes [provider]  ibuprofen (ADVIL,MOTRIN) 200 MG tablet Take 200 mg by mouth every 6 (six) hours as needed for moderate pain.    Yes [provider]  lisinopril (PRINIVIL,ZESTRIL) 20 MG tablet Take 20 mg by mouth daily.  05/19/16  Yes [provider]  metoprolol tartrate (LOPRESSOR) 25 MG tablet TAKE 1/2 TABLET BY MOUTH TWICE DAILY 06/26/15  Yes [provider]  Multiple Vitamin (MULTI-VITAMINS) TABS Take 1 tablet by mouth daily.    Yes [provider]  nystatin cream (MYCOSTATIN) Apply one application to affected area as needed. 09/10/15  Yes [provider]  pantoprazole (PROTONIX) 40 MG tablet Take 1 tablet (40 mg total) by mouth 2 (  two) times daily. 11/01/15  Yes Lucilla Lame, MD  polyethylene glycol (MIRALAX / GLYCOLAX) packet Take 17 g by mouth daily.    Yes [provider]  ranitidine (ZANTAC) 150 MG tablet TAKE 1 TABLET BY MOUTH NIGHTLY 10/29/15  Yes [provider]  simvastatin (ZOCOR) 40 MG tablet TAKE 1 TABLET BY MOUTH EVERY DAY 05/28/15  Yes [provider]  spironolactone (ALDACTONE) 25 MG tablet TAKE 1/2 TABLET BY MOUTH EVERY DAY 09/06/15  Yes [provider]  traMADol (ULTRAM) 50 MG tablet Take 1 tablet  (50 mg total) by mouth every 6 (six) hours as needed. 03/26/16  Yes Poggi, Marshall Cork, MD   Physical Exam: Vitals:   02/28/17 1046 02/28/17 1050 02/28/17 1200 02/28/17 1230  BP:  (!) 161/63 (!) 144/96   Pulse:  62 63 66  Resp:  18 16 19   Temp:  97.7 F (36.5 C)    TempSrc:  Oral    SpO2:   100% 97%  Weight: 77.1 kg (170 lb)     Height: 5\' 4"  (1.626 m)        General:  No apparent distress, WDWN, [place]/AT  Eyes: PERRL, EOMI, no scleral icterus, conjunctiva clear  ENT: moist oropharynx without exudate, TM's benign, dentition good  Neck: supple, no lymphadenopathy. No bruits or thyromegaly  Cardiovascular: regular rate without MRG; 2+ peripheral pulses, no JVD, no peripheral edema  Respiratory: CTA biL, good air movement without wheezing, rhonchi or crackled. Respiratory effort normal  Abdomen: soft, non tender to palpation, positive bowel sounds, no guarding, no rebound  Skin: no rashes or lesions  Musculoskeletal: normal bulk and tone, no joint swelling  Psychiatric: normal mood and affect, A&OX3  Neurologic: CN 2-12 grossly intact, Motor strength 5/5 in all 4 groups with symmetric DTR's and non-focal sensory exam  Labs on Admission:  Basic Metabolic Panel: Recent Labs  Lab 02/28/17 1056  NA 138  K 4.8  CL 105  CO2 24  GLUCOSE 122*  BUN 21*  CREATININE 0.75  CALCIUM 9.7   Liver Function Tests: Recent Labs  Lab 02/28/17 1200  AST 24  ALT 20  ALKPHOS 66  BILITOT 1.2  PROT 6.9  ALBUMIN 4.1   Recent Labs  Lab 02/28/17 1200  LIPASE 28   No results for input(s): AMMONIA in the last 168 hours. CBC: Recent Labs  Lab 02/28/17 1056  WBC 7.0  HGB 13.9  HCT 41.6  MCV 94.7  PLT 240   Cardiac Enzymes: Recent Labs  Lab 02/28/17 1056  TROPONINI <0.03    BNP (last 3 results) No results for input(s): BNP in the last 8760 hours.  ProBNP (last 3 results) No results for input(s): PROBNP in the last 8760 hours.  CBG: No results for input(s): GLUCAP in  the last 168 hours.  Radiological Exams on Admission: Dg Chest 2 View  Result Date: 02/28/2017 CLINICAL DATA:  Central chest pain beginning today. EXAM: CHEST  2 VIEW COMPARISON:  09/04/2015 FINDINGS: Sternotomy wires unchanged. Lungs are adequately inflated without focal airspace consolidation or effusion. Mild stable cardiomegaly. Remainder of the exam is unchanged. IMPRESSION: No active cardiopulmonary disease. Electronically Signed   By: Marin Olp M.D.   On: 02/28/2017 11:15    EKG: Independently reviewed.  Assessment/Plan Principal Problem:   Chest pain Active Problems:   Benign essential hypertension   Coronary artery disease   Mixed hyperlipidemia   Will observe on telemetry and follow enzymes. Echo ordered. Cardiology to consult.  Diet: heart healthy  Fluids: NS@75  DVT Prophylaxis: Lovenox  Code Status: FULL  Family Communication: none  Disposition Plan: home  Time spent: 50 min

## 2017-02-28 NOTE — ED Provider Notes (Signed)
[facility] Emergency Department Provider Note   ____________________________________________   First MD Initiated Contact with Patient 02/28/17 1102     (approximate)  I have reviewed the triage vital signs and the nursing notes.   HISTORY  Chief Complaint Chest Pain    HPI Megan Salinas is a 84 y.o. female is for evaluation of chest pain  Started at 7 AM, hard to describe, uncomfortable, some burning component but radiated to her left neck earlier.  This prompted her to be concerned about her heart, causing her to come for further evaluation.  She took 2 325 mg aspirins at home, by the time she got to the emergency room she reports that she had pretty good relief and really was not noticing any pain.  At present she only describes a very minimal discomfort, substernal slightly over the right side of the lower chest, no shortness of breath.  No ongoing pain in the neck.  Reports it felt similar to "angina" which she had experienced before she had her coronary bypass graft about 8 or 9 years ago  Currently reports pain is really not even a pain, very minimal hard to describe, feels much better.  Does not wish for any additional pain medicine or nitro or morphine.  She reports that the nitroglycerin dropped her blood pressure, and she does not feel that she needs any additional discomfort medicine now is better.  Past Medical History:  Diagnosis Date  . Back pain   . CAD (coronary artery disease)   . CHF (congestive heart failure) ([place])   . GERD (gastroesophageal reflux disease)   . High cholesterol   . Hypertension   . TIA (transient ischemic attack) 01/2016   "possible", no deficits  . Wears hearing aid    bilateral    Patient Active Problem List   Diagnosis Date Noted  . Chest pain 02/28/2017  . Chronic venous insufficiency 09/02/2016  . Swelling of limb 08/15/2016  . TIA (transient ischemic attack) 01/28/2016  . Carpal tunnel syndrome, right  08/13/2015  . DDD (degenerative disc disease), lumbar 09/12/2014  . Lumbar radiculitis 09/12/2014  . Lumbar stenosis with neurogenic claudication 09/12/2014  . GERD (gastroesophageal reflux disease) 09/08/2014  . History of type 2 diabetes mellitus 09/08/2014  . Benign essential hypertension 06/16/2014  . Moderate tricuspid insufficiency 06/07/2014  . Abdominal pain, diffuse 03/09/2014  . Chronic systolic CHF (congestive heart failure), NYHA class 3 ([place]) 12/05/2013  . Moderate mitral insufficiency 12/05/2013  . Coronary artery disease 03/29/2013  . Mixed hyperlipidemia 03/29/2013    Past Surgical History:  Procedure Laterality Date  . ABDOMINAL HYSTERECTOMY  1998  . APPENDECTOMY    . baldder tac  1995  . BREAST CYST ASPIRATION Right   . CARPAL TUNNEL RELEASE Right 03/26/2016   Procedure: CARPAL TUNNEL RELEASE ENDOSCOPIC  right;  Surgeon: Corky Mull, MD;  Location: [facility];  Service: Orthopedics;  Laterality: Right;  bier block  . CHOLECYSTECTOMY    . CORONARY ARTERY BYPASS GRAFT  02/15/2010   [facility], 3 vessel  . TONSILLECTOMY      Prior to Admission medications   Medication Sig Start Date End Date Taking? Authorizing Provider  acetaminophen (TYLENOL) 500 MG tablet Take 500 mg by mouth every 6 (six) hours as needed for mild pain.    Yes [provider]  amitriptyline (ELAVIL) 25 MG tablet TAKE 1/2 TABLET BY MOUTH AT BEDTIME AS NEEDED 09/12/15  Yes [provider]  aspirin EC  81 MG tablet Take 81 mg by mouth at bedtime.    Yes [provider]  ibuprofen (ADVIL,MOTRIN) 200 MG tablet Take 200 mg by mouth every 6 (six) hours as needed for moderate pain.    Yes [provider]  lisinopril (PRINIVIL,ZESTRIL) 20 MG tablet Take 20 mg by mouth daily.  05/19/16  Yes [provider]  metoprolol tartrate (LOPRESSOR) 25 MG tablet TAKE 1/2 TABLET BY MOUTH TWICE DAILY 06/26/15  Yes [provider]  Multiple Vitamin (MULTI-VITAMINS)  TABS Take 1 tablet by mouth daily.    Yes [provider]  nystatin cream (MYCOSTATIN) Apply one application to affected area as needed. 09/10/15  Yes [provider]  pantoprazole (PROTONIX) 40 MG tablet Take 1 tablet (40 mg total) by mouth 2 (two) times daily. 11/01/15  Yes Lucilla Lame, MD  polyethylene glycol (MIRALAX / GLYCOLAX) packet Take 17 g by mouth daily.    Yes [provider]  ranitidine (ZANTAC) 150 MG tablet TAKE 1 TABLET BY MOUTH NIGHTLY 10/29/15  Yes [provider]  simvastatin (ZOCOR) 40 MG tablet TAKE 1 TABLET BY MOUTH EVERY DAY 05/28/15  Yes [provider]  spironolactone (ALDACTONE) 25 MG tablet TAKE 1/2 TABLET BY MOUTH EVERY DAY 09/06/15  Yes [provider]  traMADol (ULTRAM) 50 MG tablet Take 1 tablet (50 mg total) by mouth every 6 (six) hours as needed. 03/26/16  Yes Poggi, Marshall Cork, MD    Allergies Sulfa antibiotics  Family History  Problem Relation Age of Onset  . Breast cancer Paternal Aunt        65's  . Hypertension Mother     Social History Social History   Tobacco Use  . Smoking status: Former Research scientist (life sciences)  . Smokeless tobacco: Never Used  . Tobacco comment: quit over 50 yrs ago  Substance Use Topics  . Alcohol use: Yes    Alcohol/week: 3.0 oz    Types: 5 Glasses of wine per week    Comment:    . Drug use: No    Review of Systems Constitutional: No fever/chills Eyes: No visual changes. ENT: No sore throat. Cardiovascular: See HPI Respiratory: Denies shortness of breath. Gastrointestinal: No abdominal pain.  No nausea, no vomiting.  No diarrhea.  No constipation. Genitourinary: Negative for dysuria. Musculoskeletal: Negative for back pain. Skin: Negative for rash. Neurological: Negative for headaches, focal weakness or numbness.    ____________________________________________   PHYSICAL EXAM:  VITAL SIGNS: ED Triage Vitals  Enc Vitals Group     BP 02/28/17 1050 (!) 161/63     Pulse  Rate 02/28/17 1050 62     Resp 02/28/17 1050 18     Temp 02/28/17 1050 97.7 F (36.5 C)     Temp Source 02/28/17 1050 Oral     SpO2 --      Weight 02/28/17 1046 170 lb (77.1 kg)     Height 02/28/17 1046 5\' 4"  (1.626 m)     Head Circumference --      Peak Flow --      Pain Score --      Pain Loc --      Pain Edu? --      Excl. in [place]? --     Constitutional: Alert and oriented. Well appearing and in no acute distress. Eyes: Conjunctivae are normal. Head: Atraumatic. Nose: No congestion/rhinnorhea. Mouth/Throat: Mucous membranes are moist. Neck: No stridor.   Cardiovascular: Normal rate, regular rhythm. Grossly normal heart sounds.  Good peripheral circulation. Respiratory:  Normal respiratory effort.  No retractions. Lungs CTAB. Gastrointestinal: Soft and nontender. No distention. Musculoskeletal: No lower extremity tenderness nor edema. Neurologic:  Normal speech and language. No gross focal neurologic deficits are appreciated.  Skin:  Skin is warm, dry and intact. No rash noted. Psychiatric: Mood and affect are normal. Speech and behavior are normal.  ____________________________________________   LABS (all labs ordered are listed, but only abnormal results are displayed)  Labs Reviewed  BASIC METABOLIC PANEL - Abnormal; Notable for the following components:      Result Value   Glucose, Bld 122 (*)    BUN 21 (*)    All other components within normal limits  HEPATIC FUNCTION PANEL - Abnormal; Notable for the following components:   Indirect Bilirubin 1.0 (*)    All other components within normal limits  CBC  TROPONIN I  LIPASE, BLOOD  TROPONIN I  TROPONIN I  TROPONIN I  TROPONIN I  COMPREHENSIVE METABOLIC PANEL  CBC   ____________________________________________  EKG  Reviewed and entered by me at 10:50 AM Heart rate 60 QRS 90 QTC 420 Normal sinus rhythm no evidence of ischemia noted, there is a minimal abnormality in the far less than 1 mm as compared to  previous EKG no significant changes noted  Repeat EKG performed at 11:15 AM heart rate is 60, QRS 96 QTC 430 Normal sinus rhythm no evidence of ischemia or.  Appears similar to the previous, again no signs of active ischemia are not noted ____________________________________________  RADIOLOGY   Chest x-ray reviewed by me, negative for acute  ____________________________________________   PROCEDURES  Procedure(s) performed: None  Procedures  Critical Care performed: No  ____________________________________________   INITIAL IMPRESSION / ASSESSMENT AND PLAN / ED COURSE  Pertinent labs & imaging results that were available during my care of the patient were reviewed by me and considered in my medical decision making (see chart for details).  Chest pain, feeling improved now.  In no acute distress.  EKG is reassuring, but patient notable cardiac history with known coronary disease, advanced age, and almost 10 years past coronary bypass.  She has been treated with salicylates, pain is improved.  Continue to observe her closely and check troponin.  No pulmonary symptoms.  No ripping tearing or moving pain.  No signs or symptoms suggest the need for workup for dissection or aneurysm at this time.  ----------------------------------------- 1:40 PM on 02/28/2017 -----------------------------------------  Patient reports no further episodes of discomfort since about 11 AM when she did experience another milder feeling similar in her chest.  Her EKG since that time reassuring her first troponin is negative.  Discussed with the patient, also discussed with Dr. Nehemiah Massed, he advises that observation seems a reasonable approach (my recommendation based on assessment) based on my own evaluation of the patient.  Patient is agreeable with this.  Heart score reviewed, 5 points, moderate risk, indicating need for admission/observation.  Discussed case and care with Dr. Doy Hutching who will see the  patient and evaluate, he is initially hesitant regarding need for admission, but will see the patient and provide further treatment recommendations.  Heart score = 5, moderate risk. Suggestive of admission pathway.      ____________________________________________   FINAL CLINICAL IMPRESSION(S) / ED DIAGNOSES  Final diagnoses:  Moderate risk chest pain      NEW MEDICATIONS STARTED DURING THIS VISIT:  Current Discharge Medication List       Note:  This document was prepared using Dragon voice recognition software and  may include unintentional dictation errors.     Delman Kitten, MD 02/28/17 2139

## 2017-02-28 NOTE — Progress Notes (Signed)
Patient admitted to unit. Oriented to room, call bell, and staff. Bed in lowest position. Fall safety plan reviewed. Full assessment to Epic. Skin assessment verified with Megan Salinas. Telemetry box verification with tele clerk- Box#: [id number]. Will continue to monitor.

## 2017-03-01 ENCOUNTER — Observation Stay
Admit: 2017-03-01 | Discharge: 2017-03-01 | Disposition: A | Discharge status: Home / Self Care | Payer: Medicare Other | Attending: Internal Medicine | Admitting: Internal Medicine

## 2017-03-01 DIAGNOSIS — R079 Chest pain, unspecified: Secondary | ICD-10-CM | POA: Diagnosis not present

## 2017-03-01 LAB — COMPREHENSIVE METABOLIC PANEL
ALT: 16 U/L (ref 14–54)
AST: 19 U/L (ref 15–41)
Albumin: 3.4 g/dL — ABNORMAL LOW (ref 3.5–5.0)
Alkaline Phosphatase: 56 U/L (ref 38–126)
Anion gap: 5 (ref 5–15)
BUN: 18 mg/dL (ref 6–20)
CHLORIDE: 113 mmol/L — AB (ref 101–111)
CO2: 23 mmol/L (ref 22–32)
CREATININE: 0.69 mg/dL (ref 0.44–1.00)
Calcium: 9 mg/dL (ref 8.9–10.3)
GFR calc Af Amer: >60 mL/min (ref >60)
GFR calc non Af Amer: >60 mL/min (ref >60)
Glucose, Bld: 134 mg/dL — ABNORMAL HIGH (ref 65–99)
Potassium: 4 mmol/L (ref 3.5–5.1)
SODIUM: 141 mmol/L (ref 135–145)
Total Bilirubin: 1.1 mg/dL (ref 0.3–1.2)
Total Protein: 5.9 g/dL — ABNORMAL LOW (ref 6.5–8.1)

## 2017-03-01 LAB — CBC
HCT: 38.5 % (ref 35.0–47.0)
Hemoglobin: 13.3 g/dL (ref 12.0–16.0)
MCH: 32.8 pg (ref 26.0–34.0)
MCHC: 34.5 g/dL (ref 32.0–36.0)
MCV: 95 fL (ref 80.0–100.0)
PLATELETS: 198 10*3/uL (ref 150–440)
RBC: 4.05 MIL/uL (ref 3.80–5.20)
RDW: 14.1 % (ref 11.5–14.5)
WBC: 5.5 10*3/uL (ref 3.6–11.0)

## 2017-03-01 LAB — TROPONIN I

## 2017-03-01 LAB — GLUCOSE, CAPILLARY: Glucose-Capillary: 123 mg/dL — ABNORMAL HIGH (ref 65–99)

## 2017-03-01 NOTE — Progress Notes (Signed)
Patient discharged via wheelchair and private vehicle. All dc instructions given and verbalized understanding.  

## 2017-03-01 NOTE — Discharge Summary (Signed)
[facility] NAME: Megan Salinas    MR#:  [id number]  DATE OF BIRTH:  [birth year]  DATE OF ADMISSION:  02/28/2017 ADMITTING PHYSICIAN: [name], MD  DATE OF DISCHARGE: 03/01/2017  PRIMARY CARE PHYSICIAN: Madelyn Brunner, MD    ADMISSION DIAGNOSIS:  Chest pain  DISCHARGE DIAGNOSIS:  Principal Problem:   Chest pain Active Problems:   Benign essential hypertension   Coronary artery disease   Mixed hyperlipidemia   SECONDARY DIAGNOSIS:   Past Medical History:  Diagnosis Date  . Back pain   . CAD (coronary artery disease)   . CHF (congestive heart failure) ([place])   . GERD (gastroesophageal reflux disease)   . High cholesterol   . Hypertension   . TIA (transient ischemic attack) 01/2016   "possible", no deficits  . Wears hearing aid    bilateral    HOSPITAL COURSE:   Patient presented with intermittent chest pain, because of her cardiac history monitored on telemetry floor with serial troponin which remained negative. She was seen by her cardiologist in the hospital and he suggested as patient was chest pain-free, to be discharged home and close follow-up in the clinic next week for further management. Patient was able to ambulate without much chest pain and she was satisfied with plan.  DISCHARGE CONDITIONS:   Stable.  CONSULTS OBTAINED:  Treatment Team:  Corey Skains, MD  DRUG ALLERGIES:   Allergies  Allergen Reactions  . Sulfa Antibiotics Itching    Red, itchy palms    DISCHARGE MEDICATIONS:   Allergies as of 03/01/2017      Reactions   Sulfa Antibiotics Itching   Red, itchy palms      Medication List    TAKE these medications   acetaminophen 500 MG tablet Commonly known as:  TYLENOL Take 500 mg by mouth every 6 (six) hours as needed for mild pain.   amitriptyline 25 MG tablet Commonly known as:  ELAVIL TAKE 1/2 TABLET BY MOUTH AT BEDTIME AS NEEDED   aspirin EC 81 MG  tablet Take 81 mg by mouth at bedtime.   ibuprofen 200 MG tablet Commonly known as:  ADVIL,MOTRIN Take 200 mg by mouth every 6 (six) hours as needed for moderate pain.   lisinopril 20 MG tablet Commonly known as:  PRINIVIL,ZESTRIL Take 20 mg by mouth daily.   metoprolol tartrate 25 MG tablet Commonly known as:  LOPRESSOR TAKE 1/2 TABLET BY MOUTH TWICE DAILY   MULTI-VITAMINS Tabs Take 1 tablet by mouth daily.   nystatin cream Commonly known as:  MYCOSTATIN Apply one application to affected area as needed.   pantoprazole 40 MG tablet Commonly known as:  PROTONIX Take 1 tablet (40 mg total) by mouth 2 (two) times daily.   polyethylene glycol packet Commonly known as:  MIRALAX / GLYCOLAX Take 17 g by mouth daily.   ranitidine 150 MG tablet Commonly known as:  ZANTAC TAKE 1 TABLET BY MOUTH NIGHTLY   simvastatin 40 MG tablet Commonly known as:  ZOCOR TAKE 1 TABLET BY MOUTH EVERY DAY   spironolactone 25 MG tablet Commonly known as:  ALDACTONE TAKE 1/2 TABLET BY MOUTH EVERY DAY   traMADol 50 MG tablet Commonly known as:  ULTRAM Take 1 tablet (50 mg total) by mouth every 6 (six) hours as needed.        DISCHARGE INSTRUCTIONS:    Following cardiology clinic in one week.  If you experience worsening of your  admission symptoms, develop shortness of breath, life threatening emergency, suicidal or homicidal thoughts you must seek medical attention immediately by calling 911 or calling your MD immediately  if symptoms less severe.  You Must read complete instructions/literature along with all the possible adverse reactions/side effects for all the Medicines you take and that have been prescribed to you. Take any new Medicines after you have completely understood and accept all the possible adverse reactions/side effects.   Please note  You were cared for by a hospitalist during your hospital stay. If you have any questions about your discharge medications or the care you  received while you were in the hospital after you are discharged, you can call the unit and asked to speak with the hospitalist on call if the hospitalist that took care of you is not available. Once you are discharged, your primary care physician will handle any further medical issues. Please note that NO REFILLS for any discharge medications will be authorized once you are discharged, as it is imperative that you return to your primary care physician (or establish a relationship with a primary care physician if you do not have one) for your aftercare needs so that they can reassess your need for medications and monitor your lab values.    Today   CHIEF COMPLAINT:   Chief Complaint  Patient presents with  . Chest Pain    HISTORY OF PRESENT ILLNESS:  Megan Salinas  is a 84 y.o. female with a known history of HTN, HLD, and CAD s/p CABG now with recurrent episodes of CP described as aching lasting < 5 minutes. No N/V, SOB, or sweats. Pain does not radiate. In ER, EKG and CXR OK. Troponin normal. She is afraid to go home given her cardiac hx.   VITAL SIGNS:  Blood pressure (!) 121/51, pulse 68, temperature 97.7 F (36.5 C), temperature source Oral, resp. rate 18, height 5\' 4"  (1.626 m), weight 75.9 kg (167 lb 4.8 oz), SpO2 96 %.  I/O:    Intake/Output Summary (Last 24 hours) at 03/01/2017 1259 Last data filed at 03/01/2017 1038 Gross per 24 hour  Intake 1620 ml  Output 700 ml  Net 920 ml    PHYSICAL EXAMINATION:  GENERAL:  84 y.o.-year-old patient lying in the bed with no acute distress.  EYES: Pupils equal, round, reactive to light and accommodation. No scleral icterus. Extraocular muscles intact.  HEENT: Head atraumatic, normocephalic. Oropharynx and nasopharynx clear.  NECK:  Supple, no jugular venous distention. No thyroid enlargement, no tenderness.  LUNGS: Normal breath sounds bilaterally, no wheezing, rales,rhonchi or crepitation. No use of accessory muscles of respiration.   CARDIOVASCULAR: S1, S2 normal. No murmurs, rubs, or gallops.  ABDOMEN: Soft, non-tender, non-distended. Bowel sounds present. No organomegaly or mass.  EXTREMITIES: No pedal edema, cyanosis, or clubbing.  NEUROLOGIC: Cranial nerves II through XII are intact. Muscle strength 5/5 in all extremities. Sensation intact. Gait not checked.  PSYCHIATRIC: The patient is alert and oriented x 3.  SKIN: No obvious rash, lesion, or ulcer.   DATA REVIEW:   CBC Recent Labs  Lab 03/01/17 0347  WBC 5.5  HGB 13.3  HCT 38.5  PLT 198    Chemistries  Recent Labs  Lab 03/01/17 0347  NA 141  K 4.0  CL 113*  CO2 23  GLUCOSE 134*  BUN 18  CREATININE 0.69  CALCIUM 9.0  AST 19  ALT 16  ALKPHOS 56  BILITOT 1.1    Cardiac Enzymes Recent Labs  Lab 03/01/17 0411  TROPONINI <0.03    Microbiology Results  No results found for this or any previous visit.  RADIOLOGY:  Dg Chest 2 View  Result Date: 02/28/2017 CLINICAL DATA:  Central chest pain beginning today. EXAM: CHEST  2 VIEW COMPARISON:  09/04/2015 FINDINGS: Sternotomy wires unchanged. Lungs are adequately inflated without focal airspace consolidation or effusion. Mild stable cardiomegaly. Remainder of the exam is unchanged. IMPRESSION: No active cardiopulmonary disease. Electronically Signed   By: Marin Olp M.D.   On: 02/28/2017 11:15    EKG:   Orders placed or performed during the hospital encounter of 02/28/17  . ED EKG within 10 minutes  . ED EKG within 10 minutes  . ED EKG  . ED EKG  . EKG 12-Lead  . EKG 12-Lead      Management plans discussed with the patient, family and they are in agreement.  CODE STATUS:     Code Status Orders  (From admission, onward)        Start     Ordered   02/28/17 1613  Full code  Continuous     02/28/17 1612    Code Status History    Date Active Date Inactive Code Status Order ID Comments User Context   01/28/2016 22:15 01/29/2016 19:12 Full Code [id number]  [name], MD  Inpatient    Advance Directive Documentation     Most Recent Value  Type of Advance Directive  Healthcare Power of Attorney  Pre-existing out of facility DNR order (yellow form or pink MOST form)  No data  "MOST" Form in Place?  No data      TOTAL TIME TAKING CARE OF THIS PATIENT: 35 minutes.    Vaughan Basta M.D on 03/01/2017 at 12:59 PM  Between 7am to 6pm - Pager - [phone]  After 6pm go to [email or web address] - password [email or web address] [email or web address]  Office  [phone]  CC: Primary care physician; Madelyn Brunner, MD   Note: This dictation was prepared with Dragon dictation along with smaller phrase technology. Any transcriptional errors that result from this process are unintentional.

## 2017-03-01 NOTE — Progress Notes (Signed)
Patient ambulated and got herself washed up this morning denies any complaints of chest pain stated that she wants to go home.

## 2017-03-01 NOTE — Consult Note (Signed)
[facility] Consultation Note  Patient ID: Megan Salinas, MRN: [id number], DOB/AGE: 1926-03-22 84 y.o. Admit date: 02/28/2017   Date of Consult: 03/01/2017 Primary Physician: Madelyn Brunner, MD Primary Cardiologist: Nehemiah Massed  Chief Complaint:  Chief Complaint  Patient presents with  . Chest Pain   Reason for Consult: Chest pain  HPI: 84 y.o. female with known cardiovascular disease status post coronary artery bypass graft in the past essential hypertension mixed hyperlipidemia and previous history of TIA who has done very well with appropriate medication management for cardiovascular risk factors over the last several years after bypass surgery.  The patient has been relatively ambulatory but has knee problems and does not walk very far.  With this she does not have any episodes of significant anginal symptoms.  The patient woke up yesterday morning with some substernal chest discomfort radiating into her back and associated with mild shortness of breath lasting approximately 2 hours with no change in intensity with any activities.  The patient was concerned about this and did not have any other progression.  When seen in the emergency room she had a normal troponin and normal EKG and had full resolution of her symptoms without cyst additional medication management.  Since then she had some really short sharp episodes of chest pain went turning over in her bed which were fleeting.  Otherwise feeling fine today and no further evidence of elevation of troponin throughout the night.  She remains on appropriate medication management and hemodynamically stable  Past Medical History:  Diagnosis Date  . Back pain   . CAD (coronary artery disease)   . CHF (congestive heart failure) ([place])   . GERD (gastroesophageal reflux disease)   . High cholesterol   . Hypertension   . TIA (transient ischemic attack) 01/2016   "possible", no deficits  . Wears hearing aid    bilateral       Surgical History:  Past Surgical History:  Procedure Laterality Date  . ABDOMINAL HYSTERECTOMY  1998  . APPENDECTOMY    . baldder tac  1995  . BREAST CYST ASPIRATION Right   . CARPAL TUNNEL RELEASE Right 03/26/2016   Procedure: CARPAL TUNNEL RELEASE ENDOSCOPIC  right;  Surgeon: Corky Mull, MD;  Location: [place] [facility];  Service: Orthopedics;  Laterality: Right;  bier block  . CHOLECYSTECTOMY    . CORONARY ARTERY BYPASS GRAFT  02/15/2010   [facility], 3 vessel  . TONSILLECTOMY       Home Meds: Prior to Admission medications   Medication Sig Start Date End Date Taking? Authorizing Provider  acetaminophen (TYLENOL) 500 MG tablet Take 500 mg by mouth every 6 (six) hours as needed for mild pain.    Yes [provider]  amitriptyline (ELAVIL) 25 MG tablet TAKE 1/2 TABLET BY MOUTH AT BEDTIME AS NEEDED 09/12/15  Yes [provider]  aspirin EC 81 MG tablet Take 81 mg by mouth at bedtime.    Yes [provider]  ibuprofen (ADVIL,MOTRIN) 200 MG tablet Take 200 mg by mouth every 6 (six) hours as needed for moderate pain.    Yes [provider]  lisinopril (PRINIVIL,ZESTRIL) 20 MG tablet Take 20 mg by mouth daily.  05/19/16  Yes [provider]  metoprolol tartrate (LOPRESSOR) 25 MG tablet TAKE 1/2 TABLET BY MOUTH TWICE DAILY 06/26/15  Yes [provider]  Multiple Vitamin (MULTI-VITAMINS) TABS Take 1 tablet by mouth daily.    Yes [provider]  nystatin cream (MYCOSTATIN)  Apply one application to affected area as needed. 09/10/15  Yes [provider]  pantoprazole (PROTONIX) 40 MG tablet Take 1 tablet (40 mg total) by mouth 2 (two) times daily. 11/01/15  Yes Lucilla Lame, MD  polyethylene glycol (MIRALAX / GLYCOLAX) packet Take 17 g by mouth daily.    Yes [provider]  ranitidine (ZANTAC) 150 MG tablet TAKE 1 TABLET BY MOUTH NIGHTLY 10/29/15  Yes [provider]  simvastatin (ZOCOR) 40 MG tablet TAKE 1  TABLET BY MOUTH EVERY DAY 05/28/15  Yes [provider]  spironolactone (ALDACTONE) 25 MG tablet TAKE 1/2 TABLET BY MOUTH EVERY DAY 09/06/15  Yes [provider]  traMADol (ULTRAM) 50 MG tablet Take 1 tablet (50 mg total) by mouth every 6 (six) hours as needed. 03/26/16  Yes Poggi, Marshall Cork, MD    Inpatient Medications:  . amitriptyline  12.5 mg Oral QHS  . aspirin EC  81 mg Oral QHS  . docusate sodium  100 mg Oral BID  . enoxaparin (LOVENOX) injection  40 mg Subcutaneous Q24H  . famotidine  20 mg Oral QHS  . lisinopril  20 mg Oral Daily  . metoprolol tartrate  12.5 mg Oral BID  . pantoprazole  40 mg Oral Daily  . simvastatin  40 mg Oral Daily  . spironolactone  12.5 mg Oral Daily   . sodium chloride 75 mL/hr at 03/01/17 0120    Allergies:  Allergies  Allergen Reactions  . Sulfa Antibiotics Itching    Red, itchy palms    Social History   Socioeconomic History  . Marital status: Widowed    Spouse name: Not on file  . Number of children: Not on file  . Years of education: Not on file  . Highest education level: Not on file  Social Needs  . Financial resource strain: Not on file  . Food insecurity - worry: Not on file  . Food insecurity - inability: Not on file  . Transportation needs - medical: Not on file  . Transportation needs - non-medical: Not on file  Occupational History  . Not on file  Tobacco Use  . Smoking status: Former Research scientist (life sciences)  . Smokeless tobacco: Never Used  . Tobacco comment: quit over 50 yrs ago  Substance and Sexual Activity  . Alcohol use: Yes    Alcohol/week: 3.0 oz    Types: 5 Glasses of wine per week    Comment:    . Drug use: No  . Sexual activity: Not Currently  Other Topics Concern  . Not on file  Social History Narrative  . Not on file     Family History  Problem Relation Age of Onset  . Breast cancer Paternal Aunt        43's  . Hypertension Mother      Review of Systems Positive for chest pain Negative  for: General:  chills, fever, night sweats or weight changes.  Cardiovascular: PND orthopnea syncope dizziness  Dermatological skin lesions rashes Respiratory: Cough congestion Urologic: Frequent urination urination at night and hematuria Abdominal: negative for nausea, vomiting, diarrhea, bright red blood per rectum, melena, or hematemesis Neurologic: negative for visual changes, and/or hearing changes  All other systems reviewed and are otherwise negative except as noted above.  Labs: Recent Labs    02/28/17 1430 02/28/17 1628 02/28/17 2247 03/01/17 0411  TROPONINI <0.03 <0.03 <0.03 <0.03   Lab Results  Component Value Date   WBC 5.5 03/01/2017   HGB 13.3 03/01/2017  HCT 38.5 03/01/2017   MCV 95.0 03/01/2017   PLT 198 03/01/2017    Recent Labs  Lab 03/01/17 0347  NA 141  K 4.0  CL 113*  CO2 23  BUN 18  CREATININE 0.69  CALCIUM 9.0  PROT 5.9*  BILITOT 1.1  ALKPHOS 56  ALT 16  AST 19  GLUCOSE 134*   Lab Results  Component Value Date   CHOL 160 01/29/2016   HDL 61 01/29/2016   LDLCALC 67 01/29/2016   TRIG 158 (H) 01/29/2016   No results found for: DDIMER  Radiology/Studies:  Dg Chest 2 View  Result Date: 02/28/2017 CLINICAL DATA:  Central chest pain beginning today. EXAM: CHEST  2 VIEW COMPARISON:  09/04/2015 FINDINGS: Sternotomy wires unchanged. Lungs are adequately inflated without focal airspace consolidation or effusion. Mild stable cardiomegaly. Remainder of the exam is unchanged. IMPRESSION: No active cardiopulmonary disease. Electronically Signed   By: Marin Olp M.D.   On: 02/28/2017 11:15    EKG: Normal sinus rhythm  Weights: Filed Weights   02/28/17 1046 02/28/17 1619 03/01/17 0432  Weight: 170 lb (77.1 kg) 167 lb 3.2 oz (75.8 kg) 167 lb 4.8 oz (75.9 kg)     Physical Exam: Blood pressure (!) 128/40, pulse 68, temperature 98.3 F (36.8 C), temperature source Oral, resp. rate 18, height 5\' 4"  (1.626 m), weight 167 lb 4.8 oz (75.9 kg),  SpO2 96 %. Body mass index is 28.72 kg/m. General: Well developed, well nourished, in no acute distress. Head eyes ears nose throat: Normocephalic, atraumatic, sclera non-icteric, no xanthomas, nares are without discharge. No apparent thyromegaly and/or mass  Lungs: Normal respiratory effort.  no wheezes, no rales, no rhonchi.  Heart: RRR with normal S1 S2. no murmur gallop, no rub, PMI is normal size and placement, carotid upstroke normal without bruit, jugular venous pressure is normal Abdomen: Soft, non-tender, non-distended with normoactive bowel sounds. No hepatomegaly. No rebound/guarding. No obvious abdominal masses. Abdominal aorta is normal size without bruit Extremities: No edema. no cyanosis, no clubbing, no ulcers  Peripheral : 2+ bilateral upper extremity pulses, 2+ bilateral femoral pulses, 2+ bilateral dorsal pedal pulse Neuro: Alert and oriented. No facial asymmetry. No focal deficit. Moves all extremities spontaneously. Musculoskeletal: Normal muscle tone without kyphosis Psych:  Responds to questions appropriately with a normal affect.    Assessment: 84 year old female with known coronary artery disease status post coronary bypass graft essential hypertension mixed hyperlipidemia with atypical chest discomfort and no current evidence of myocardial infarction or acute coronary syndrome or heart failure  Plan: 1.  Continue observation of chest discomfort throughout morning 2.  Continue and reinstate appropriate medication management for cardiovascular disease status post coronary bypass graft as previously started as outpatient 3.  Consideration of addition of isosorbide if necessary 4.  Begin ambulation today and follow for improvements of symptoms.  If patient has no further symptoms okay for discharged home with outpatient follow-up on Monday for further possible stress test.  If patient has recurrent chest discomfort would consider further intervention including cardiac  catheterization.  Signed, Corey Skains M.D. [facility] 03/01/2017, 6:07 AM

## 2017-03-01 NOTE — Plan of Care (Signed)
Patient is progressing as expected. 

## 2017-03-01 NOTE — Discharge Instructions (Signed)
Chest Wall Pain °Chest wall pain is pain in or around the bones and muscles of your chest. Sometimes, an injury causes this pain. Sometimes, the cause may not be known. This pain may take several weeks or longer to get better. °Follow these instructions at home: °Pay attention to any changes in your symptoms. Take these actions to help with your pain: °· Rest as told by your doctor. °· Avoid activities that cause pain. Try not to use your chest, belly (abdominal), or side muscles to lift heavy things. °· If directed, apply ice to the painful area: °? Put ice in a plastic bag. °? Place a towel between your skin and the bag. °? Leave the ice on for 20 minutes, 2-3 times per day. °· Take over-the-counter and prescription medicines only as told by your doctor. °· Do not use tobacco products, including cigarettes, chewing tobacco, and e-cigarettes. If you need help quitting, ask your doctor. °· Keep all follow-up visits as told by your doctor. This is important. ° °Contact a doctor if: °· You have a fever. °· Your chest pain gets worse. °· You have new symptoms. °Get help right away if: °· You feel sick to your stomach (nauseous) or you throw up (vomit). °· You feel sweaty or light-headed. °· You have a cough with phlegm (sputum) or you cough up blood. °· You are short of breath. °This information is not intended to replace advice given to you by your health care provider. Make sure you discuss any questions you have with your health care provider. °Document Released: 06/18/2007 Document Revised: 06/07/2015 Document Reviewed: 03/27/2014 °Elsevier Interactive Patient Education © 2018 [employer] ° °

## 2017-03-02 LAB — ECHOCARDIOGRAM COMPLETE
Height: 64 in
WEIGHTICAEL: 2676.8 [oz_av]

## 2017-06-12 ENCOUNTER — Ambulatory Visit (INDEPENDENT_AMBULATORY_CARE_PROVIDER_SITE_OTHER): Payer: 59 | Admitting: Vascular Surgery

## 2017-07-07 ENCOUNTER — Other Ambulatory Visit: Payer: Self-pay | Admitting: Internal Medicine

## 2017-07-07 DIAGNOSIS — Z1231 Encounter for screening mammogram for malignant neoplasm of breast: Secondary | ICD-10-CM

## 2017-08-10 ENCOUNTER — Ambulatory Visit
Admission: RE | Admit: 2017-08-10 | Discharge: 2017-08-10 | Disposition: A | Discharge status: Home / Self Care | Payer: Medicare Other | Source: Ambulatory Visit | Attending: Internal Medicine | Admitting: Internal Medicine

## 2017-08-10 DIAGNOSIS — Z1231 Encounter for screening mammogram for malignant neoplasm of breast: Secondary | ICD-10-CM | POA: Insufficient documentation

## 2017-12-18 ENCOUNTER — Other Ambulatory Visit: Payer: Self-pay | Admitting: Internal Medicine

## 2017-12-18 DIAGNOSIS — N360 Urethral fistula: Secondary | ICD-10-CM

## 2017-12-18 DIAGNOSIS — N321 Vesicointestinal fistula: Secondary | ICD-10-CM

## 2017-12-25 ENCOUNTER — Ambulatory Visit: Payer: Medicare Other

## 2017-12-29 ENCOUNTER — Other Ambulatory Visit: Payer: Self-pay | Admitting: Otolaryngology

## 2017-12-29 DIAGNOSIS — H9192 Unspecified hearing loss, left ear: Secondary | ICD-10-CM

## 2018-01-14 ENCOUNTER — Ambulatory Visit
Admission: RE | Admit: 2018-01-14 | Discharge: 2018-01-14 | Disposition: A | Discharge status: Home / Self Care | Payer: Medicare Other | Source: Ambulatory Visit | Attending: Otolaryngology | Admitting: Otolaryngology

## 2018-01-14 DIAGNOSIS — H9192 Unspecified hearing loss, left ear: Secondary | ICD-10-CM | POA: Diagnosis present

## 2018-01-20 ENCOUNTER — Ambulatory Visit
Admission: RE | Admit: 2018-01-20 | Discharge: 2018-01-20 | Disposition: A | Discharge status: Home / Self Care | Payer: Medicare Other | Source: Ambulatory Visit | Attending: Internal Medicine | Admitting: Internal Medicine

## 2018-01-20 DIAGNOSIS — N321 Vesicointestinal fistula: Secondary | ICD-10-CM | POA: Insufficient documentation

## 2018-01-20 DIAGNOSIS — N360 Urethral fistula: Secondary | ICD-10-CM | POA: Diagnosis present

## 2018-02-22 DIAGNOSIS — M19031 Primary osteoarthritis, right wrist: Secondary | ICD-10-CM | POA: Insufficient documentation

## 2018-06-01 ENCOUNTER — Other Ambulatory Visit: Payer: Self-pay

## 2018-06-01 ENCOUNTER — Emergency Department
Admission: EM | Admit: 2018-06-01 | Discharge: 2018-06-01 | Disposition: A | Discharge status: Home / Self Care | Payer: Medicare Other | Attending: Emergency Medicine | Admitting: Emergency Medicine

## 2018-06-01 ENCOUNTER — Emergency Department: Payer: Medicare Other

## 2018-06-01 DIAGNOSIS — I10 Essential (primary) hypertension: Secondary | ICD-10-CM | POA: Diagnosis not present

## 2018-06-01 DIAGNOSIS — Z79899 Other long term (current) drug therapy: Secondary | ICD-10-CM | POA: Insufficient documentation

## 2018-06-01 DIAGNOSIS — M79662 Pain in left lower leg: Secondary | ICD-10-CM | POA: Diagnosis present

## 2018-06-01 DIAGNOSIS — L03116 Cellulitis of left lower limb: Secondary | ICD-10-CM | POA: Insufficient documentation

## 2018-06-01 DIAGNOSIS — Z8673 Personal history of transient ischemic attack (TIA), and cerebral infarction without residual deficits: Secondary | ICD-10-CM | POA: Insufficient documentation

## 2018-06-01 DIAGNOSIS — Z87891 Personal history of nicotine dependence: Secondary | ICD-10-CM | POA: Insufficient documentation

## 2018-06-01 DIAGNOSIS — I2510 Atherosclerotic heart disease of native coronary artery without angina pectoris: Secondary | ICD-10-CM | POA: Insufficient documentation

## 2018-06-01 DIAGNOSIS — Z7982 Long term (current) use of aspirin: Secondary | ICD-10-CM | POA: Diagnosis not present

## 2018-06-01 LAB — COMPREHENSIVE METABOLIC PANEL
ALT: 15 U/L (ref 0–44)
AST: 17 U/L (ref 15–41)
Albumin: 3.8 g/dL (ref 3.5–5.0)
Alkaline Phosphatase: 68 U/L (ref 38–126)
Anion gap: 8 (ref 5–15)
BUN: 15 mg/dL (ref 8–23)
CO2: 23 mmol/L (ref 22–32)
Calcium: 9.6 mg/dL (ref 8.9–10.3)
Chloride: 105 mmol/L (ref 98–111)
Creatinine, Ser: 0.47 mg/dL (ref 0.44–1.00)
GFR calc Af Amer: >60 mL/min (ref >60)
GFR calc non Af Amer: >60 mL/min (ref >60)
Glucose, Bld: 141 mg/dL — ABNORMAL HIGH (ref 70–99)
Potassium: 4 mmol/L (ref 3.5–5.1)
Sodium: 136 mmol/L (ref 135–145)
Total Bilirubin: 1.5 mg/dL — ABNORMAL HIGH (ref 0.3–1.2)
Total Protein: 6.8 g/dL (ref 6.5–8.1)

## 2018-06-01 LAB — CBC WITH DIFFERENTIAL/PLATELET
Abs Immature Granulocytes: 0.05 10*3/uL (ref 0.00–0.07)
Basophils Absolute: 0 10*3/uL (ref 0.0–0.1)
Basophils Relative: 0 %
Eosinophils Absolute: 0.1 10*3/uL (ref 0.0–0.5)
Eosinophils Relative: 1 %
HCT: 36.5 % (ref 36.0–46.0)
Hemoglobin: 12.3 g/dL (ref 12.0–15.0)
Immature Granulocytes: 0 %
Lymphocytes Relative: 15 %
Lymphs Abs: 1.8 10*3/uL (ref 0.7–4.0)
MCH: 31.4 pg (ref 26.0–34.0)
MCHC: 33.7 g/dL (ref 30.0–36.0)
MCV: 93.1 fL (ref 80.0–100.0)
Monocytes Absolute: 0.9 10*3/uL (ref 0.1–1.0)
Monocytes Relative: 8 %
Neutro Abs: 8.6 10*3/uL — ABNORMAL HIGH (ref 1.7–7.7)
Neutrophils Relative %: 76 %
Platelets: 192 10*3/uL (ref 150–400)
RBC: 3.92 MIL/uL (ref 3.87–5.11)
RDW: 14.3 % (ref 11.5–15.5)
WBC: 11.4 10*3/uL — ABNORMAL HIGH (ref 4.0–10.5)
nRBC: 0 % (ref 0.0–0.2)

## 2018-06-01 LAB — PROTIME-INR
INR: 1.1 (ref 0.8–1.2)
Prothrombin Time: 14.4 seconds (ref 11.4–15.2)

## 2018-06-01 MED ORDER — SODIUM CHLORIDE 0.9 % IV BOLUS
1000.0000 mL | Freq: Once | INTRAVENOUS | Status: AC
  Administered 2018-06-01: 1000 mL via INTRAVENOUS

## 2018-06-01 MED ORDER — CLINDAMYCIN HCL 300 MG PO CAPS: 300.0000 mg | ORAL_CAPSULE | Freq: Four times a day (QID) | ORAL | 0 refills | Status: DC

## 2018-06-01 MED ORDER — HYDROCODONE-ACETAMINOPHEN 5-325 MG PO TABS: 1.0000 | ORAL_TABLET | ORAL | 0 refills | Status: DC | PRN

## 2018-06-01 MED ORDER — CLINDAMYCIN PHOSPHATE 600 MG/50ML IV SOLN
600.0000 mg | Freq: Once | INTRAVENOUS | Status: AC
  Administered 2018-06-01: 600 mg via INTRAVENOUS
  Filled 2018-06-01 (×2): qty 50

## 2018-06-01 MED ORDER — MORPHINE SULFATE (PF) 4 MG/ML IV SOLN
4.0000 mg | Freq: Once | INTRAVENOUS | Status: AC
  Administered 2018-06-01: 4 mg via INTRAVENOUS
  Filled 2018-06-01: qty 1

## 2018-06-01 NOTE — ED Provider Notes (Signed)
[facility] Emergency Department Provider Note  ____________________________________________  Time seen: Approximately 12:59 PM  I have reviewed the triage vital signs and the nursing notes.   HISTORY  Chief Complaint Leg Swelling    HPI Megan Salinas is a 85 y.o. female who presents the emergency department complaining of left lower extremity and left ankle pain.  Patient reports that she first noticed the pain yesterday.  She is unable to describe it well but does agree to both a sharp and a burning sensation.  This is localized in the distal calf, left ankle.  She denies any trauma to the area.  She does report edema and erythema that she noticed after pain began yesterday.  Patient denies any history of DVT or PE.  She does have a history of coronary artery disease, congestive heart failure, GERD, hypertension.  Patient reports that the pain, edema is different than CHF.  Patient denies any other complaints of fevers or chills, headache, chest pain, shortness of breath, abdominal pain.         Past Medical History:  Diagnosis Date  . Back pain   . CAD (coronary artery disease)   . CHF (congestive heart failure) ([place])   . GERD (gastroesophageal reflux disease)   . High cholesterol   . Hypertension   . TIA (transient ischemic attack) 01/2016   "possible", no deficits  . Wears hearing aid    bilateral    Patient Active Problem List   Diagnosis Date Noted  . Chest pain 02/28/2017  . Chronic venous insufficiency 09/02/2016  . Swelling of limb 08/15/2016  . TIA (transient ischemic attack) 01/28/2016  . Carpal tunnel syndrome, right 08/13/2015  . DDD (degenerative disc disease), lumbar 09/12/2014  . Lumbar radiculitis 09/12/2014  . Lumbar stenosis with neurogenic claudication 09/12/2014  . GERD (gastroesophageal reflux disease) 09/08/2014  . History of type 2 diabetes mellitus 09/08/2014  . Benign essential hypertension 06/16/2014  . Moderate  tricuspid insufficiency 06/07/2014  . Abdominal pain, diffuse 03/09/2014  . Chronic systolic CHF (congestive heart failure), NYHA class 3 ([place]) 12/05/2013  . Moderate mitral insufficiency 12/05/2013  . Coronary artery disease 03/29/2013  . Mixed hyperlipidemia 03/29/2013    Past Surgical History:  Procedure Laterality Date  . ABDOMINAL HYSTERECTOMY  1998  . APPENDECTOMY    . baldder tac  1995  . BREAST CYST ASPIRATION Right   . CARPAL TUNNEL RELEASE Right 03/26/2016   Procedure: CARPAL TUNNEL RELEASE ENDOSCOPIC  right;  Surgeon: Corky Mull, MD;  Location: [facility];  Service: Orthopedics;  Laterality: Right;  bier block  . CHOLECYSTECTOMY    . CORONARY ARTERY BYPASS GRAFT  02/15/2010   [facility], 3 vessel  . TONSILLECTOMY      Prior to Admission medications   Medication Sig Start Date End Date Taking? Authorizing Provider  acetaminophen (TYLENOL) 500 MG tablet Take 500 mg by mouth every 6 (six) hours as needed for mild pain.     [provider]  amitriptyline (ELAVIL) 25 MG tablet TAKE 1/2 TABLET BY MOUTH AT BEDTIME AS NEEDED 09/12/15   [provider]  aspirin EC 81 MG tablet Take 81 mg by mouth at bedtime.     [provider]  clindamycin (CLEOCIN) 300 MG capsule Take 1 capsule (300 mg total) by mouth 4 (four) times daily. 06/01/18   , Charline Bills, PA-C  HYDROcodone-acetaminophen (NORCO/VICODIN) 5-325 MG tablet Take 1 tablet by mouth every 4 (four) hours as needed for moderate pain.  06/01/18   , Charline Bills, PA-C  ibuprofen (ADVIL,MOTRIN) 200 MG tablet Take 200 mg by mouth every 6 (six) hours as needed for moderate pain.     [provider]  lisinopril (PRINIVIL,ZESTRIL) 20 MG tablet Take 20 mg by mouth daily.  05/19/16   [provider]  metoprolol tartrate (LOPRESSOR) 25 MG tablet TAKE 1/2 TABLET BY MOUTH TWICE DAILY 06/26/15   [provider]  Multiple Vitamin (MULTI-VITAMINS) TABS Take 1 tablet by mouth daily.      [provider]  nystatin cream (MYCOSTATIN) Apply one application to affected area as needed. 09/10/15   [provider]  pantoprazole (PROTONIX) 40 MG tablet Take 1 tablet (40 mg total) by mouth 2 (two) times daily. 11/01/15   Lucilla Lame, MD  polyethylene glycol (MIRALAX / Floria Raveling) packet Take 17 g by mouth daily.     [provider]  ranitidine (ZANTAC) 150 MG tablet TAKE 1 TABLET BY MOUTH NIGHTLY 10/29/15   [provider]  simvastatin (ZOCOR) 40 MG tablet TAKE 1 TABLET BY MOUTH EVERY DAY 05/28/15   [provider]  spironolactone (ALDACTONE) 25 MG tablet TAKE 1/2 TABLET BY MOUTH EVERY DAY 09/06/15   [provider]  traMADol (ULTRAM) 50 MG tablet Take 1 tablet (50 mg total) by mouth every 6 (six) hours as needed. 03/26/16   Poggi, Marshall Cork, MD    Allergies Sulfa antibiotics  Family History  Problem Relation Age of Onset  . Breast cancer Paternal Aunt        57's  . Hypertension Mother     Social History Social History   Tobacco Use  . Smoking status: Former Research scientist (life sciences)  . Smokeless tobacco: Never Used  . Tobacco comment: quit over 50 yrs ago  Substance Use Topics  . Alcohol use: Yes    Alcohol/week: 5.0 standard drinks    Types: 5 Glasses of wine per week    Comment:    . Drug use: No     Review of Systems  Constitutional: No fever/chills Eyes: No visual changes. No discharge ENT: No upper respiratory complaints. Cardiovascular: no chest pain. Respiratory: no cough. No SOB. Gastrointestinal: No abdominal pain.  No nausea, no vomiting.  No diarrhea.  No constipation. Musculoskeletal: Positive for left lower extremity pain, erythema, edema. Skin: Negative for rash, abrasions, lacerations, ecchymosis. Neurological: Negative for headaches, focal weakness or numbness. 10-point ROS otherwise negative.  ____________________________________________   PHYSICAL EXAM:  VITAL SIGNS: ED Triage Vitals  Enc Vitals Group      BP 06/01/18 1224 (!) 134/51     Pulse Rate 06/01/18 1224 76     Resp 06/01/18 1224 17     Temp 06/01/18 1224 98.4 F (36.9 C)     Temp Source 06/01/18 1224 Oral     SpO2 06/01/18 1224 98 %     Weight 06/01/18 1225 165 lb (74.8 kg)     Height 06/01/18 1225 5\' 4"  (1.626 m)     Head Circumference --      Peak Flow --      Pain Score 06/01/18 1225 8     Pain Loc --      Pain Edu? --      Excl. in [place]? --      Constitutional: Alert and oriented. Well appearing and in no acute distress. Eyes: Conjunctivae are normal. PERRL. EOMI. Head: Atraumatic. Neck: No stridor.    Cardiovascular: Normal rate, regular rhythm. Normal S1 and S2.  Good peripheral circulation.  Respiratory: Normal respiratory effort without tachypnea or retractions. Lungs CTAB. Good air entry to the bases with no decreased or absent breath sounds. Musculoskeletal: Full range of motion to all extremities. No gross deformities appreciated.  Visualization of the left lower extremity reveals moderate erythema extending from midfoot, to the mid calf region.  2 distinct regions of erythema are appreciated.  These are marked with indelible marker.  Moderate edema when compared with unaffected extremity.  Area is very tender to palpation, it appears more superficial/skin tenderness versus underlying structures.  No visible wounds or ulcerations.  No deformity.  Good extension flexion of the ankle joint.  Dorsalis pedis pulse intact.  Posterior tibialis pulse intact.  Sensation intact all 5 digits. Neurologic:  Normal speech and language. No gross focal neurologic deficits are appreciated.  Skin:  Skin is warm, dry and intact. No rash noted. Psychiatric: Mood and affect are normal. Speech and behavior are normal. Patient exhibits appropriate insight and judgement.   ____________________________________________   LABS (all labs ordered are listed, but only abnormal results are displayed)  Labs Reviewed  COMPREHENSIVE METABOLIC  PANEL - Abnormal; Notable for the following components:      Result Value   Glucose, Bld 141 (*)    Total Bilirubin 1.5 (*)    All other components within normal limits  CBC WITH DIFFERENTIAL/PLATELET - Abnormal; Notable for the following components:   WBC 11.4 (*)    Neutro Abs 8.6 (*)    All other components within normal limits  PROTIME-INR   ____________________________________________  EKG   ____________________________________________  RADIOLOGY I personally viewed and evaluated these images as part of my medical decision making, as well as reviewing the written report by the radiologist.  Dg Ankle Complete Left  Result Date: 06/01/2018 CLINICAL DATA:  Then and swelling EXAM: LEFT ANKLE COMPLETE - 3+ VIEW COMPARISON:  None. FINDINGS: Frontal, oblique, and lateral views were obtained. Bones are diffusely osteoporotic. There is mild generalized soft tissue swelling. There is no evident fracture or joint effusion. There is no appreciable joint space narrowing or erosion. There are spurs arising from the posterior and inferior calcaneus. The ankle mortise appears intact. There is plantar fascial calcification. IMPRESSION: Bones osteoporotic. Soft tissue swelling. No evident fracture. No appreciable joint space narrowing. Prominent calcaneal spurs noted. Ankle mortise appears intact. Electronically Signed   By: Lowella Grip III M.D.   On: 06/01/2018 13:27   US Venous Img Lower Unilateral Left  Result Date: 06/01/2018 CLINICAL DATA:  Redness, swelling, pain x1 day EXAM: LEFT LOWER EXTREMITY VENOUS DOPPLER ULTRASOUND TECHNIQUE: Gray-scale sonography with compression, as well as color and duplex ultrasound, were performed to evaluate the deep venous system from the level of the common femoral vein through the popliteal and proximal calf veins. COMPARISON:  None FINDINGS: Normal compressibility of the common femoral, superficial femoral, and popliteal veins, as well as the proximal calf  veins. No filling defects to suggest DVT on grayscale or color Doppler imaging. Doppler waveforms show normal direction of venous flow, normal respiratory phasicity and response to augmentation. There is hypoechoic nonocclusive mural thrombus in the great saphenous vein above the knee. There is occlusive thrombus in the segment of the great saphenous vein at the knee. Left inguinal lymph nodes up to 1 cm short axis diameter incidentally noted. Survey views of the contralateral common femoral vein are unremarkable. IMPRESSION: 1. No femoropopliteal and no calf DVT in the visualized calf veins. If clinical symptoms are inconsistent or if there are persistent  or worsening symptoms, further imaging (possibly involving the iliac veins) may be warranted. 2. Superficial thrombophlebitis involving the left great saphenous vein. Electronically Signed   By: Lucrezia Europe M.D.   On: 06/01/2018 14:23    ____________________________________________    PROCEDURES  Procedure(s) performed:    Procedures    Medications  sodium chloride 0.9 % bolus 1,000 mL (0 mLs Intravenous Stopped 06/01/18 1446)  clindamycin (CLEOCIN) IVPB 600 mg (600 mg Intravenous New Bag/Given 06/01/18 1531)  morphine 4 MG/ML injection 4 mg (4 mg Intravenous Given 06/01/18 1510)     ____________________________________________   INITIAL IMPRESSION / ASSESSMENT AND PLAN / ED COURSE  Pertinent labs & imaging results that were available during my care of the patient were reviewed by me and considered in my medical decision making (see chart for details).  Review of the [place] CSRS was performed in accordance of the [place] prior to dispensing any controlled drugs.  Clinical Course as of May 31 1600  Tue Jun 01, 2018  1438 Patient presented to the emergency department complaining of left ankle calf pain, erythema, edema.  On exam, patient does have erythema and edema of the ankle joint extending into the mid calf.  Differential included DVT,  cellulitis, osteomyelitis, septic joint.  Ultrasound reveals thrombophlebitis of the great saphenous vein.  No evidence of gross underlying DVT.  Given the overlying skin changes, patient's tenderness to palpation over superficial skin, I suspect that this is more likely cellulitis versus vascular problem.  This is further correlated with patient's elevated white blood cell count at 11.4 and rising neutrophils.  Patient will be placed on antibiotics for same.   [JC]    Clinical Course User Index [JC] , Charline Bills, PA-C          Patient's diagnosis is consistent with cellulitis of the left lower extremity.  Patient presented to the emergency department complaining of pain, erythema, edema of the left lower extremity.  Initial differential included DVT, cellulitis, septic arthritis.  Ultrasound reveals no DVT, but does reveal thrombophlebitis.  X-ray reveals no signs of osteomyelitis.  With elevated white blood cell count, physical exam findings, I suspect that this is cellulitis.  Patient will be treated with clindamycin.  Given the patient lives in a long-term care facility, I will cover for MRSA.  Patient is allergic to sulfa antibiotics and will be placed on clindamycin instead.  I have discussed at length the use of probiotics to limit chance of any complications such as C. difficile..  Follow-up with primary care..  Patient is given ED precautions to return to the ED for any worsening or new symptoms.     ____________________________________________  FINAL CLINICAL IMPRESSION(S) / ED DIAGNOSES  Final diagnoses:  Cellulitis of left lower extremity      NEW MEDICATIONS STARTED DURING THIS VISIT:  ED Discharge Orders         Ordered    clindamycin (CLEOCIN) 300 MG capsule  4 times daily     06/01/18 1502    HYDROcodone-acetaminophen (NORCO/VICODIN) 5-325 MG tablet  Every 4 hours PRN     06/01/18 1502              This chart was dictated using voice recognition  software/Dragon. Despite best efforts to proofread, errors can occur which can change the meaning. Any change was purely unintentional.    Darletta Moll, PA-C 06/01/18 1602    Earleen Newport, MD 06/02/18 [phone]

## 2018-06-01 NOTE — ED Notes (Addendum)
Pt assisted to use toilet by this tech and Kayla,EDT. Pt back to bed and on cardiac monitor. Pure wick was placed. Pt urged to use call light for any needs.

## 2018-06-01 NOTE — ED Notes (Signed)
Patient transported to Ultrasound 

## 2018-06-01 NOTE — ED Notes (Signed)
Pt reminded not to get out of bed without calling for assistance. Posey sitter in place in bed.

## 2018-06-01 NOTE — ED Notes (Signed)
This Rn spoke with patient and patient's daughter regarding D/C. Pt instructed to call for a ride at [facility] at this time. Pt states understanding. Pt also requesting medications be sent to [facility] on [address] at this time, PA made aware of patient request, states he will change pharamcy.

## 2018-06-01 NOTE — ED Triage Notes (Signed)
Pt c/o left leg swelling with pain and redness that started yesterday.

## 2018-06-01 NOTE — ED Notes (Signed)
Pt's daughter [facility]attie [facility] [phone] called and updated. Expressing concerns regarding pt D/C back to independent living facility with pain medication due to increased risk of falls. This RN explained would speak with provider. Lattie Haw states she will speak with [facility].

## 2018-08-24 ENCOUNTER — Encounter (INDEPENDENT_AMBULATORY_CARE_PROVIDER_SITE_OTHER): Payer: Self-pay | Admitting: Vascular Surgery

## 2018-08-24 ENCOUNTER — Ambulatory Visit (INDEPENDENT_AMBULATORY_CARE_PROVIDER_SITE_OTHER): Payer: Medicare Other | Admitting: Vascular Surgery

## 2018-08-24 ENCOUNTER — Other Ambulatory Visit: Payer: Self-pay

## 2018-08-24 ENCOUNTER — Encounter (INDEPENDENT_AMBULATORY_CARE_PROVIDER_SITE_OTHER): Payer: Self-pay

## 2018-08-24 VITALS — BP 182/80 | HR 78 | Resp 14 | Ht 64.0 in | Wt 156.0 lb

## 2018-08-24 DIAGNOSIS — E782 Mixed hyperlipidemia: Secondary | ICD-10-CM

## 2018-08-24 DIAGNOSIS — M7989 Other specified soft tissue disorders: Secondary | ICD-10-CM

## 2018-08-24 DIAGNOSIS — I10 Essential (primary) hypertension: Secondary | ICD-10-CM

## 2018-08-24 DIAGNOSIS — I872 Venous insufficiency (chronic) (peripheral): Secondary | ICD-10-CM | POA: Diagnosis not present

## 2018-08-24 NOTE — Progress Notes (Signed)
MRN : [id number]  Megan Salinas is a 85 y.o. (29-Jan-1926) female who presents with chief complaint of  Chief Complaint  Patient presents with  . Follow-up  .  History of Present Illness: Patient returns today in follow up about 2 years after her last visit for left lower extremity pain, swelling, and recent cellulitis.  She was predominantly having symptoms in her right legs 2 years ago and we last saw her.  Her venous insufficiency is causing pain and swelling.  Compression stockings significantly improve the symptoms and she elected not to have any procedures performed at that time.  Her right leg is doing okay but there is a little more pain and swelling now than there was 2 years ago.  Earlier this year, she developed cellulitis in the left leg and was treated with rounds of antibiotics for that.  The leg is still painful and the swelling is a little bit better than it was but not resolved.  She does have some prominent varicosities on that side as well.  She was checked for a DVT and none was present but she has not had a functional venous study  Current Outpatient Medications  Medication Sig Dispense Refill  . amitriptyline (ELAVIL) 25 MG tablet TAKE 1/2 TABLET BY MOUTH AT BEDTIME AS NEEDED    . aspirin EC 81 MG tablet Take 81 mg by mouth at bedtime.     . famotidine (PEPCID) 40 MG tablet Take 40 mg by mouth daily.    Marland Kitchen lisinopril (PRINIVIL,ZESTRIL) 20 MG tablet Take 20 mg by mouth daily.   11  . metoprolol tartrate (LOPRESSOR) 25 MG tablet TAKE 1/2 TABLET BY MOUTH TWICE DAILY    . Multiple Vitamin (MULTI-VITAMINS) TABS Take 1 tablet by mouth daily.     . pantoprazole (PROTONIX) 40 MG tablet Take 1 tablet (40 mg total) by mouth 2 (two) times daily. 60 tablet 11  . simvastatin (ZOCOR) 40 MG tablet TAKE 1 TABLET BY MOUTH EVERY DAY    . spironolactone (ALDACTONE) 25 MG tablet TAKE 1/2 TABLET BY MOUTH EVERY DAY    . acetaminophen (TYLENOL) 500 MG tablet Take 500 mg by mouth every 6 (six)  hours as needed for mild pain.     . clindamycin (CLEOCIN) 300 MG capsule Take 1 capsule (300 mg total) by mouth 4 (four) times daily. 28 capsule 0  . HYDROcodone-acetaminophen (NORCO/VICODIN) 5-325 MG tablet Take 1 tablet by mouth every 4 (four) hours as needed for moderate pain. 12 tablet 0  . ibuprofen (ADVIL,MOTRIN) 200 MG tablet Take 200 mg by mouth every 6 (six) hours as needed for moderate pain.     Marland Kitchen nystatin cream (MYCOSTATIN) Apply one application to affected area as needed.    . polyethylene glycol (MIRALAX / GLYCOLAX) packet Take 17 g by mouth daily.     . ranitidine (ZANTAC) 150 MG tablet TAKE 1 TABLET BY MOUTH NIGHTLY    . traMADol (ULTRAM) 50 MG tablet Take 1 tablet (50 mg total) by mouth every 6 (six) hours as needed. 20 tablet 0   No current facility-administered medications for this visit.     Past Medical History:  Diagnosis Date  . Back pain   . CAD (coronary artery disease)   . CHF (congestive heart failure) ([place])   . GERD (gastroesophageal reflux disease)   . High cholesterol   . Hypertension   . TIA (transient ischemic attack) 01/2016   "possible", no deficits  . Wears hearing aid  bilateral    Past Surgical History:  Procedure Laterality Date  . ABDOMINAL HYSTERECTOMY  1998  . APPENDECTOMY    . baldder tac  1995  . BREAST CYST ASPIRATION Right   . CARPAL TUNNEL RELEASE Right 03/26/2016   Procedure: CARPAL TUNNEL RELEASE ENDOSCOPIC  right;  Surgeon: Corky Mull, MD;  Location: [facility];  Service: Orthopedics;  Laterality: Right;  bier block  . CHOLECYSTECTOMY    . CORONARY ARTERY BYPASS GRAFT  02/15/2010   [facility], 3 vessel  . TONSILLECTOMY      Social History Social History   Tobacco Use  . Smoking status: Former Research scientist (life sciences)  . Smokeless tobacco: Never Used  . Tobacco comment: quit over 50 yrs ago  Substance Use Topics  . Alcohol use: Yes    Alcohol/week: 5.0 standard drinks    Types: 5 Glasses of wine per week    Comment:    . Drug  use: No     Family History Family History  Problem Relation Age of Onset  . Breast cancer Paternal Aunt        85's  . Hypertension Mother   no bleeding or clotting disorers  Allergies  Allergen Reactions  . Sulfa Antibiotics Itching and Rash    Red, itchy palms     REVIEW OF SYSTEMS (Negative unless checked)  Constitutional: [] ?Weight loss  [] ?Fever  [] ?Chills Cardiac: [] ?Chest pain   [] ?Chest pressure   [] ?Palpitations   [] ?Shortness of breath when laying flat   [] ?Shortness of breath at rest   [] ?Shortness of breath with exertion. Vascular:  [] ?Pain in legs with walking   [] ?Pain in legs at rest   [] ?Pain in legs when laying flat   [] ?Claudication   [] ?Pain in feet when walking  [] ?Pain in feet at rest  [] ?Pain in feet when laying flat   [] ?History of DVT   [] ?Phlebitis   [x] ?Swelling in legs   [x] ?Varicose veins   [] ?Non-healing ulcers Pulmonary:   [] ?Uses home oxygen   [] ?Productive cough   [] ?Hemoptysis   [] ?Wheeze  [] ?COPD   [] ?Asthma Neurologic:  [] ?Dizziness  [] ?Blackouts   [] ?Seizures   [] ?History of stroke   [x] ?History of TIA  [] ?Aphasia   [] ?Temporary blindness   [] ?Dysphagia   [] ?Weakness or numbness in arms   [] ?Weakness or numbness in legs Musculoskeletal:  [x] ?Arthritis   [] ?Joint swelling   [x] ?Joint pain   [x] ?Low back pain Hematologic:  [] ?Easy bruising  [] ?Easy bleeding   [] ?Hypercoagulable state   [] ?Anemic  [] ?Hepatitis Gastrointestinal:  [] ?Blood in stool   [] ?Vomiting blood  [x] ?Gastroesophageal reflux/heartburn   [] ?Abdominal pain Genitourinary:  [] ?Chronic kidney disease   [] ?Difficult urination  [] ?Frequent urination  [] ?Burning with urination   [] ?Hematuria Skin:  [] ?Rashes   [] ?Ulcers   [] ?Wounds Psychological:  [] ?History of anxiety   [] ? History of major depression.  Physical Examination  BP (!) 182/80 (BP Location: Left Wrist, Patient Position: Sitting, Cuff Size: Normal)   Pulse 78   Resp 14   Ht 5\' 4"  (1.626 m)   Wt 156 lb (70.8 kg)   BMI  26.78 kg/m  Gen:  WD/WN, NAD. Appears younger than stated age Head: Superior/AT, No temporalis wasting. Ear/Nose/Throat: Hearing diminished, nares w/o erythema or drainage Eyes: Conjunctiva clear. Sclera non-icteric Neck: Supple.  Trachea midline Pulmonary:  Good air movement, no use of accessory muscles.  Cardiac: irregular Vascular: Extensive varicosities on the right leg measuring up to about 3 to 4 mm.  Diffuse varicosities on the left leg  measuring 2 to 3 mm.  Stasis dermatitis changes worse on the left leg than the right leg. Vessel Right Left  Radial Palpable Palpable                          PT Not Palpable 1+ Palpable  DP 1+ Palpable 1+ Palpable   Gastrointestinal: soft, non-tender/non-distended. No guarding/reflex.  Musculoskeletal: M/S 5/5 throughout.  No deformity or atrophy.  Mild bilateral lower extremity edema a little worse on the right than the left but with more stasis changes on the left than the right. Neurologic: Sensation grossly intact in extremities.  Symmetrical.  Speech is fluent.  Psychiatric: Judgment intact, Mood & affect appropriate for pt's clinical situation. Dermatologic: No rashes or ulcers noted.  No cellulitis or open wounds.       Labs Recent Results (from the past 2160 hour(s))  Comprehensive metabolic panel     Status: Abnormal   Collection Time: 06/01/18  1:24 PM  Result Value Ref Range   Sodium 136 135 - 145 mmol/L   Potassium 4.0 3.5 - 5.1 mmol/L   Chloride 105 98 - 111 mmol/L   CO2 23 22 - 32 mmol/L   Glucose, Bld 141 (H) 70 - 99 mg/dL   BUN 15 8 - 23 mg/dL   Creatinine, Ser 0.47 0.44 - 1.00 mg/dL   Calcium 9.6 8.9 - 10.3 mg/dL   Total Protein 6.8 6.5 - 8.1 g/dL   Albumin 3.8 3.5 - 5.0 g/dL   AST 17 15 - 41 U/L   ALT 15 0 - 44 U/L   Alkaline Phosphatase 68 38 - 126 U/L   Total Bilirubin 1.5 (H) 0.3 - 1.2 mg/dL   GFR calc non Af Amer >60 >60 mL/min   GFR calc Af Amer >60 >60 mL/min   Anion gap 8 5 - 15    Comment: Performed at  [facility], [address] [place], [place] [address]  CBC with Differential     Status: Abnormal   Collection Time: 06/01/18  1:24 PM  Result Value Ref Range   WBC 11.4 (H) 4.0 - 10.5 K/uL   RBC 3.92 3.87 - 5.11 MIL/uL   Hemoglobin 12.3 12.0 - 15.0 g/dL   HCT 36.5 36.0 - 46.0 %   MCV 93.1 80.0 - 100.0 fL   MCH 31.4 26.0 - 34.0 pg   MCHC 33.7 30.0 - 36.0 g/dL   RDW 14.3 11.5 - 15.5 %   Platelets 192 150 - 400 K/uL   nRBC 0.0 0.0 - 0.2 %   Neutrophils Relative % 76 %   Neutro Abs 8.6 (H) 1.7 - 7.7 K/uL   Lymphocytes Relative 15 %   Lymphs Abs 1.8 0.7 - 4.0 K/uL   Monocytes Relative 8 %   Monocytes Absolute 0.9 0.1 - 1.0 K/uL   Eosinophils Relative 1 %   Eosinophils Absolute 0.1 0.0 - 0.5 K/uL   Basophils Relative 0 %   Basophils Absolute 0.0 0.0 - 0.1 K/uL   Immature Granulocytes 0 %   Abs Immature Granulocytes 0.05 0.00 - 0.07 K/uL    Comment: Performed at [facility], [address] [place], [place] [address]  Protime-INR     Status: None   Collection Time: 06/01/18  1:24 PM  Result Value Ref Range   Prothrombin Time 14.4 11.4 - 15.2 seconds   INR 1.1 0.8 - 1.2    Comment: (NOTE) INR goal varies based on  device and disease states. Performed at [facility], [address], [place], [place] [address]     Radiology No results found.  Assessment/Plan Benign essential hypertension blood pressure control important in reducing the progression of atherosclerotic disease. On appropriate oral medications.   Swelling of limb Improved with conservative management initially, but now worse.  Likely from venous insufficiency but may also have a component of lymphedema from chronic scarring of the lymph channels.  History of type 2 diabetes mellitus blood glucose control important in reducing the progression of atherosclerotic disease. Also, involved in wound healing. On appropriate medications.  Chronic venous insufficiency The patient  has a long history of venous insufficiency and now has worsening symptoms on the left leg.  Her symptoms on the right leg are not quite as well-controlled as they were at her last visit.  At this point, it has been almost 2 years so a bilateral reflux study will be done at her convenience.  She should wear compression stockings bilaterally and elevate her legs as tolerated.  She may benefit from a lymphedema pump as well as an adjuvant therapy as she has likely developed lymphedema from chronic scarring and lymphatic channels.  We will see her back following her venous reflux study to discuss the results and determine further treatment options    Leotis Pain, MD  08/24/2018 10:59 AM    This note was created with Dragon medical transcription system.  Any errors from dictation are purely unintentional

## 2018-08-24 NOTE — Assessment & Plan Note (Signed)
The patient has a long history of venous insufficiency and now has worsening symptoms on the left leg.  Her symptoms on the right leg are not quite as well-controlled as they were at her last visit.  At this point, it has been almost 2 years so a bilateral reflux study will be done at her convenience.  She should wear compression stockings bilaterally and elevate her legs as tolerated.  She may benefit from a lymphedema pump as well as an adjuvant therapy as she has likely developed lymphedema from chronic scarring and lymphatic channels.  We will see her back following her venous reflux study to discuss the results and determine further treatment options

## 2018-08-24 NOTE — Patient Instructions (Signed)

## 2018-08-27 ENCOUNTER — Ambulatory Visit (INDEPENDENT_AMBULATORY_CARE_PROVIDER_SITE_OTHER): Payer: Medicare Other | Admitting: Nurse Practitioner

## 2018-08-27 ENCOUNTER — Ambulatory Visit (INDEPENDENT_AMBULATORY_CARE_PROVIDER_SITE_OTHER): Payer: Medicare Other

## 2018-08-27 ENCOUNTER — Encounter (INDEPENDENT_AMBULATORY_CARE_PROVIDER_SITE_OTHER): Payer: Self-pay | Admitting: Nurse Practitioner

## 2018-08-27 ENCOUNTER — Other Ambulatory Visit: Payer: Self-pay

## 2018-08-27 VITALS — BP 145/77 | HR 48 | Resp 12 | Ht 64.0 in | Wt 154.0 lb

## 2018-08-27 DIAGNOSIS — K219 Gastro-esophageal reflux disease without esophagitis: Secondary | ICD-10-CM | POA: Diagnosis not present

## 2018-08-27 DIAGNOSIS — I890 Lymphedema, not elsewhere classified: Secondary | ICD-10-CM | POA: Diagnosis not present

## 2018-08-27 DIAGNOSIS — I872 Venous insufficiency (chronic) (peripheral): Secondary | ICD-10-CM | POA: Diagnosis not present

## 2018-09-01 IMAGING — US US CAROTID DUPLEX BILAT
1 series · 13 of 24 positions shown · non-contrast
Comparison: None.

CLINICAL DATA: TIA. History of CAD (post CABG), hypertension and
hyperlipidemia.

EXAM:
BILATERAL CAROTID DUPLEX ULTRASOUND
TECHNIQUE: Gray scale imaging, color Doppler and duplex ultrasound were
performed of bilateral carotid and vertebral arteries in the neck.

[Series 1: us carotid duplex bilat · 13 of 55 slices shown]
[im 1/55]
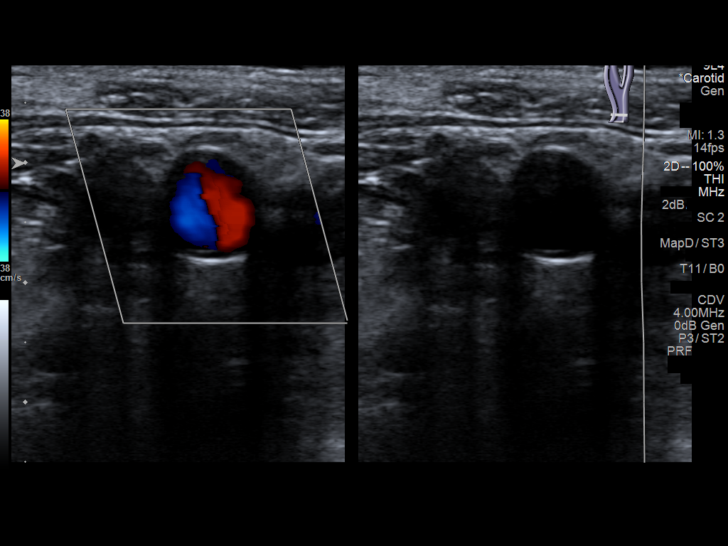
[im 5/55]
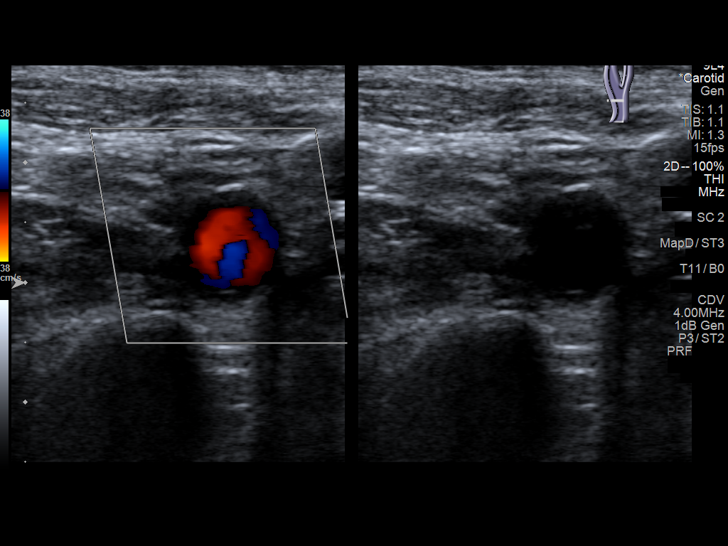
[im 10/55]
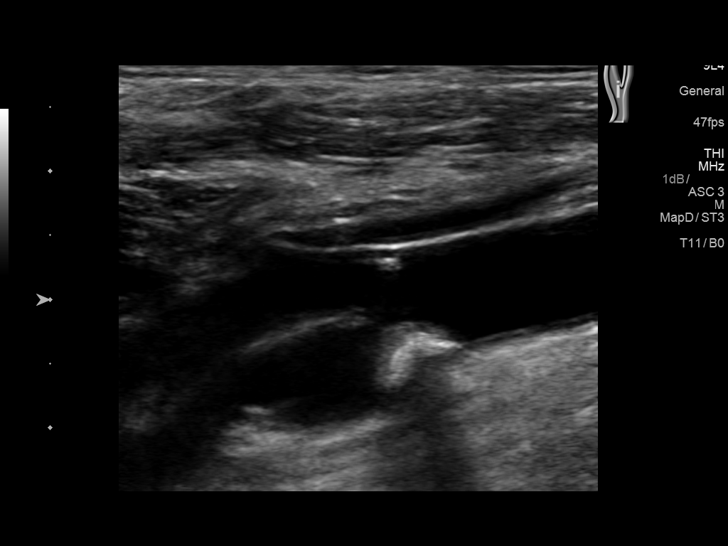
[im 15/55]
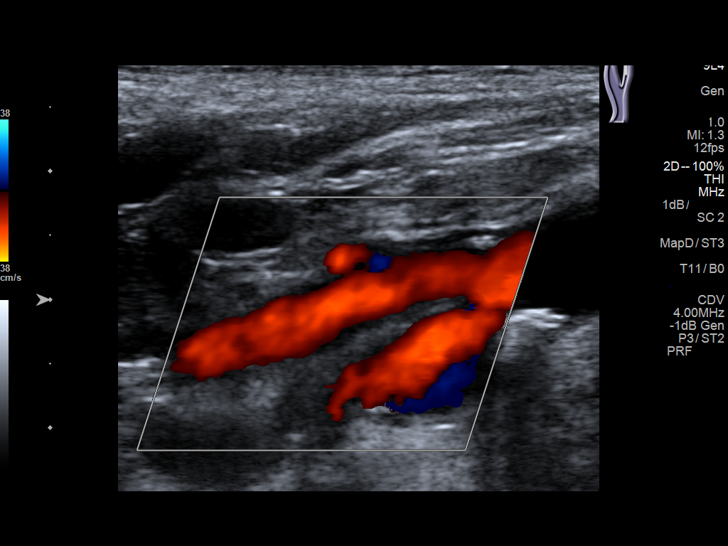
[im 19/55]
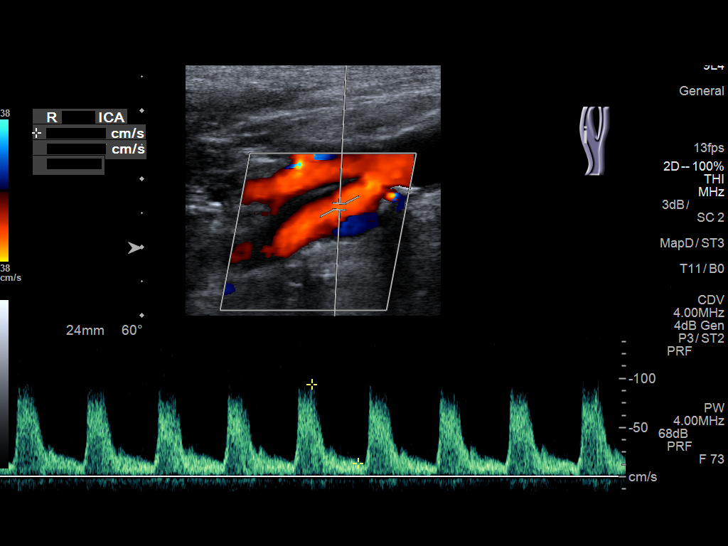
[im 24/55]
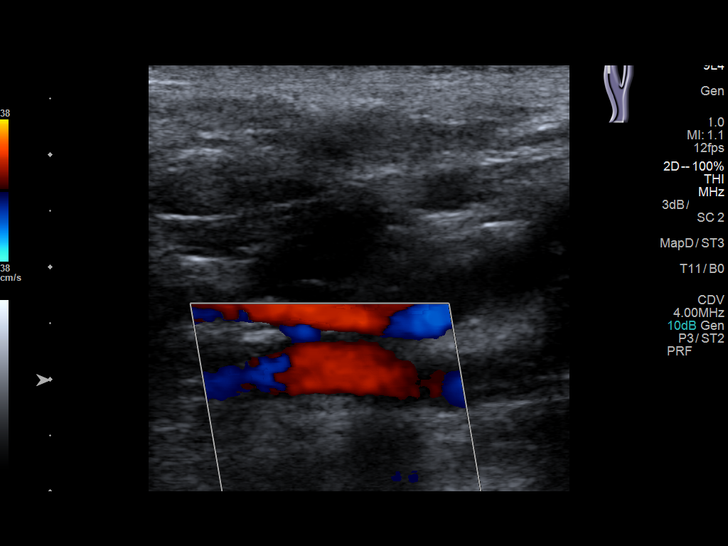
[im 29/55]
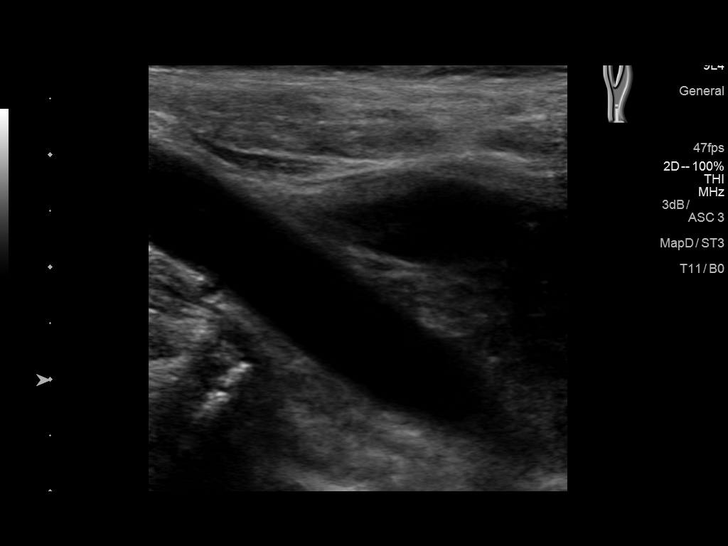
[im 31/55]
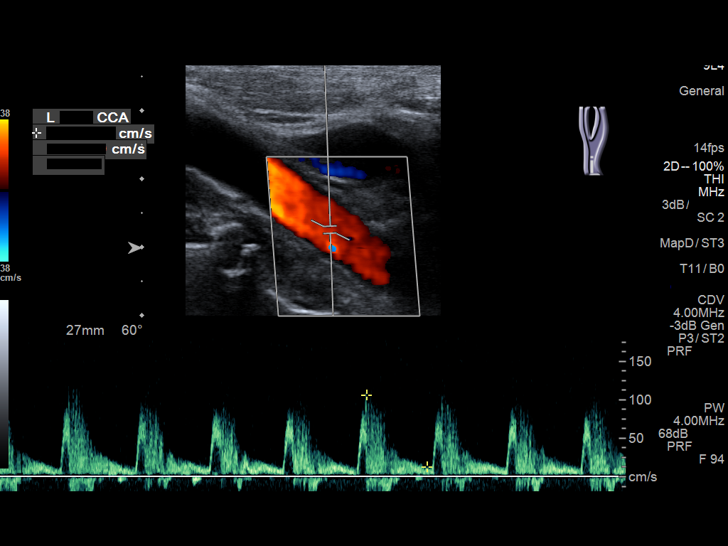
[im 36/55]
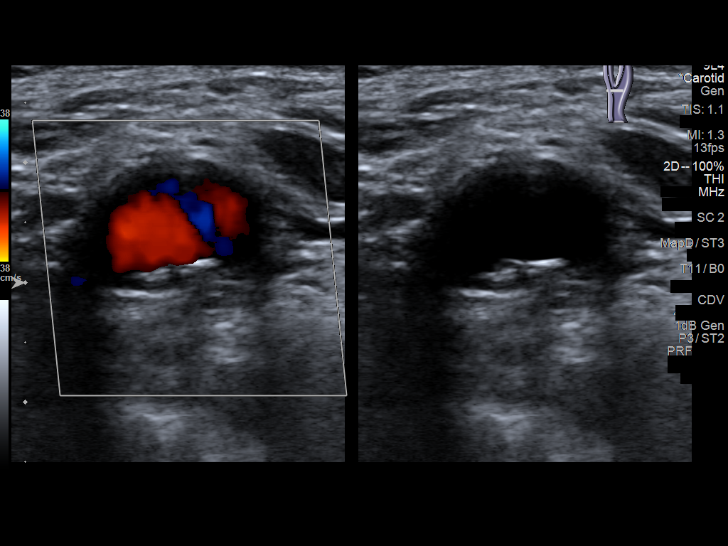
[im 40/55]
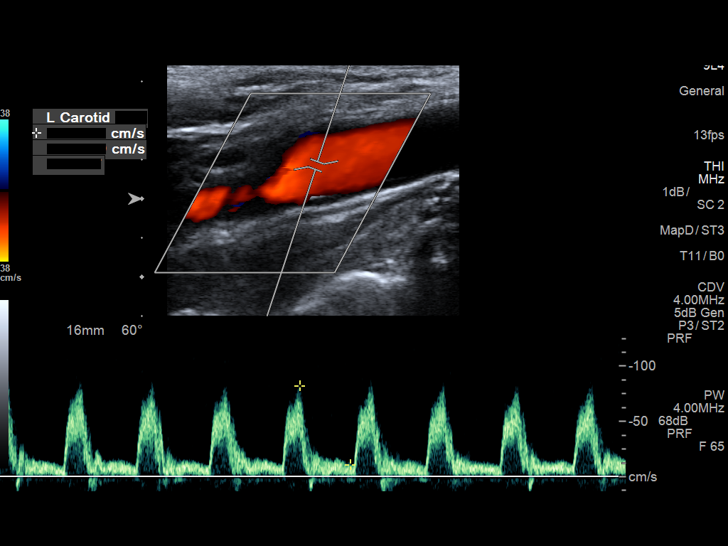
[im 45/55]
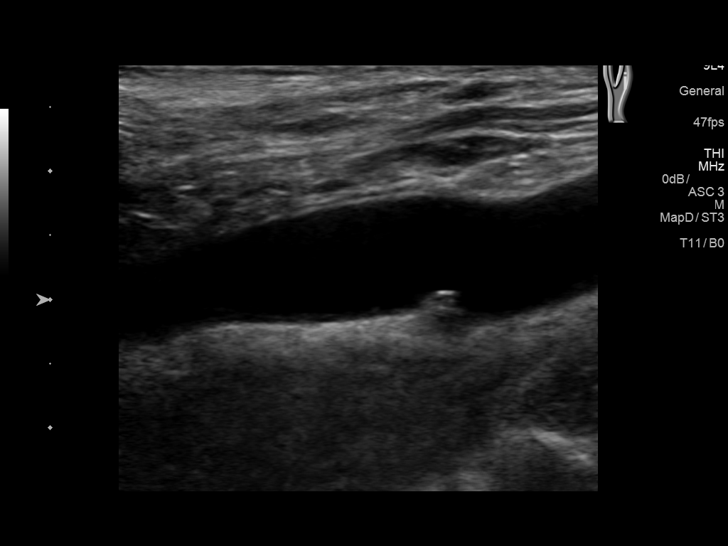
[im 50/55]
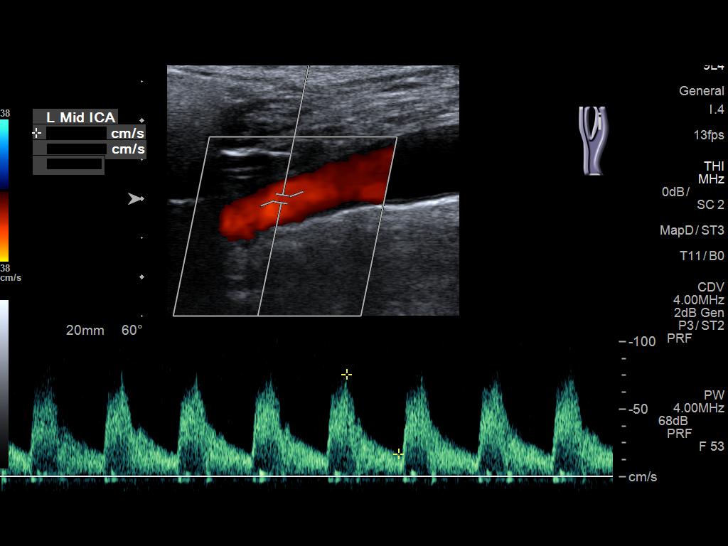
[im 55/55]
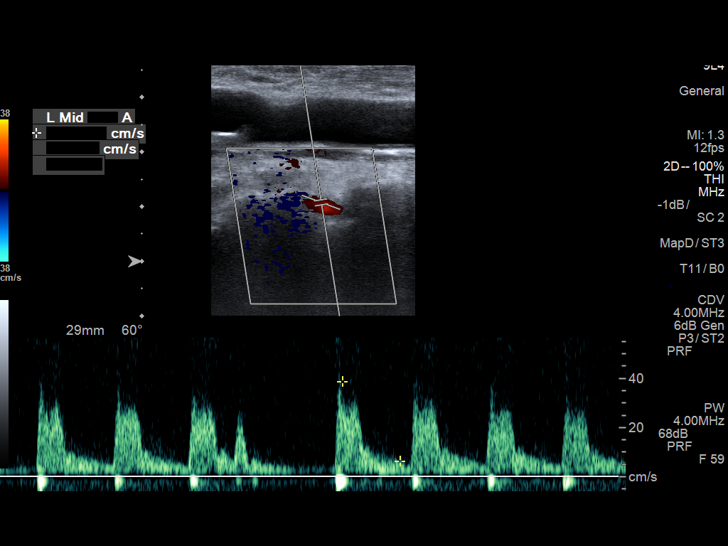

[13 of 24 positions shown; findings below may reference images not displayed]

FINDINGS: Criteria: Quantification of carotid stenosis is based on velocity
parameters that correlate the residual internal carotid diameter
with NASCET-based stenosis levels, using the diameter of the distal
internal carotid lumen as the denominator for stenosis measurement.

The following velocity measurements were obtained:

RIGHT

ICA:  97/18 cm/sec

CCA:  82/14 cm/sec

SYSTOLIC ICA/CCA RATIO:

DIASTOLIC ICA/CCA RATIO:

ECA:  103 cm/sec

LEFT

ICA:  76/17 cm/sec

CCA:  90/7 cm/sec

SYSTOLIC ICA/CCA RATIO:

DIASTOLIC ICA/CCA RATIO:

ECA:  135 cm/sec

RIGHT CAROTID ARTERY: There is a moderate to large amount of
eccentric mixed echogenic partially shadowing plaque within the
right carotid bulb (image 11), extending to involve the origin and
proximal aspect the right internal carotid artery over image 18),
not resulting in elevated peak systolic velocities within the
interrogated course the right internal carotid artery to suggest a
hemodynamically significant stenosis.

RIGHT VERTEBRAL ARTERY:  Antegrade Flow

LEFT CAROTID ARTERY: There is a minimal to moderate amount of
eccentric mixed echogenic plaque within the left carotid bulb
(images 37 and 38), extending to involve the origin and proximal
aspect the left internal carotid artery (is 46), not resulting in
elevated peak systolic velocities within the interrogated course of
the left internal carotid artery to suggest a hemodynamically
significant stenosis.

LEFT VERTEBRAL ARTERY:  Antegrade Flow

Incidental note is made of an apparent cardiac arrhythmia
(representative image 56).
IMPRESSION: 1. Moderate to large amount of bilateral atherosclerotic plaque,
right greater than left, not resulting in a hemodynamically
significant stenosis within either internal carotid artery.
2. Incidental note made of an apparent cardiac arrhythmia. Further
evaluation with ECG monitoring could be performed as clinically
indicated.

## 2018-09-05 DIAGNOSIS — I890 Lymphedema, not elsewhere classified: Secondary | ICD-10-CM | POA: Insufficient documentation

## 2018-09-05 NOTE — Progress Notes (Signed)
SUBJECTIVE:  Patient ID: Megan Salinas, female    DOB: 1926/06/24, 85 y.o.   MRN: [id number] Chief Complaint  Patient presents with  . Follow-up    HPI  Megan Salinas is a 85 y.o. female Patient is seen for evaluation of leg swelling. The patient first noticed the swelling remotely but is now concerned because of a significant increase in the overall edema. The swelling is associated with pain and discoloration. The patient notes that in the morning the legs are significantly improved but they steadily worsened throughout the course of the day. Elevation makes the legs better, dependency makes them much worse.   There is no history of ulcerations associated with the swelling.   The patient denies any recent changes in their medications.  The patient has not been wearing graduated compression.  The patient has no had any past angiography, interventions or vascular surgery.  The patient denies a history of DVT or PE. There is no prior history of phlebitis. There is no history of primary lymphedema.  There is no history of radiation treatment to the groin or pelvis No history of malignancies. No history of trauma or groin or pelvic surgery. No history of foreign travel or parasitic infections area   Patient has evidence of reflux within the bilateral common femoral veins, femoral veins, and great saphenous veins.  The popliteal on the right lower extremity.  Patient has no evidence of DVT or superficial venous thrombosis bilaterally. Past Medical History:  Diagnosis Date  . Back pain   . CAD (coronary artery disease)   . CHF (congestive heart failure) ([place])   . GERD (gastroesophageal reflux disease)   . High cholesterol   . Hypertension   . TIA (transient ischemic attack) 01/2016   "possible", no deficits  . Wears hearing aid    bilateral    Past Surgical History:  Procedure Laterality Date  . ABDOMINAL HYSTERECTOMY  1998  . APPENDECTOMY    . baldder tac  1995  .  BREAST CYST ASPIRATION Right   . CARPAL TUNNEL RELEASE Right 03/26/2016   Procedure: CARPAL TUNNEL RELEASE ENDOSCOPIC  right;  Surgeon: Corky Mull, MD;  Location: [place];  Service: Orthopedics;  Laterality: Right;  bier block  . CHOLECYSTECTOMY    . CORONARY ARTERY BYPASS GRAFT  02/15/2010   [facility], 3 vessel  . TONSILLECTOMY      Social History   Socioeconomic History  . Marital status: Widowed    Spouse name: Not on file  . Number of children: Not on file  . Years of education: Not on file  . Highest education level: Not on file  Occupational History  . Not on file  Social Needs  . Financial resource strain: Not on file  . Food insecurity    Worry: Not on file    Inability: Not on file  . Transportation needs    Medical: Not on file    Non-medical: Not on file  Tobacco Use  . Smoking status: Former Research scientist (life sciences)  . Smokeless tobacco: Never Used  . Tobacco comment: quit over 50 yrs ago  Substance and Sexual Activity  . Alcohol use: Yes    Alcohol/week: 5.0 standard drinks    Types: 5 Glasses of wine per week    Comment:    . Drug use: No  . Sexual activity: Not Currently  Lifestyle  . Physical activity    Days per week: Not on file    Minutes per session:  Not on file  . Stress: Not on file  Relationships  . Social Herbalist on phone: Not on file    Gets together: Not on file    Attends religious service: Not on file    Active member of club or organization: Not on file    Attends meetings of clubs or organizations: Not on file    Relationship status: Not on file  . Intimate partner violence    Fear of current or ex partner: Not on file    Emotionally abused: Not on file    Physically abused: Not on file    Forced sexual activity: Not on file  Other Topics Concern  . Not on file  Social History Narrative  . Not on file    Family History  Problem Relation Age of Onset  . Breast cancer Paternal Aunt        70's  . Hypertension Mother      Allergies  Allergen Reactions  . Sulfa Antibiotics Itching and Rash    Red, itchy palms     Review of Systems   Review of Systems: Negative Unless Checked Constitutional: [] Weight loss  [] Fever  [] Chills Cardiac: [] Chest pain   []  Atrial Fibrillation  [] Palpitations   [] Shortness of breath when laying flat   [] Shortness of breath with exertion. [] Shortness of breath at rest Vascular:  [] Pain in legs with walking   [] Pain in legs with standing [] Pain in legs when laying flat   [] Claudication    [] Pain in feet when laying flat    [] History of DVT   [] Phlebitis   [x] Swelling in legs   [x] Varicose veins   [] Non-healing ulcers Pulmonary:   [] Uses home oxygen   [] Productive cough   [] Hemoptysis   [] Wheeze  [] COPD   [] Asthma Neurologic:  [] Dizziness   [] Seizures  [] Blackouts [] History of stroke   [] History of TIA  [] Aphasia   [] Temporary Blindness   [] Weakness or numbness in arm   [] Weakness or numbness in leg Musculoskeletal:   [] Joint swelling   [] Joint pain   [] Low back pain  []  History of Knee Replacement [x] Arthritis [] back Surgeries  []  Spinal Stenosis    Hematologic:  [] Easy bruising  [] Easy bleeding   [] Hypercoagulable state   [] Anemic Gastrointestinal:  [] Diarrhea   [] Vomiting  [] Gastroesophageal reflux/heartburn   [] Difficulty swallowing. [] Abdominal pain Genitourinary:  [] Chronic kidney disease   [] Difficult urination  [] Anuric   [] Blood in urine [] Frequent urination  [] Burning with urination   [] Hematuria Skin:  [] Rashes   [] Ulcers [] Wounds Psychological:  [] History of anxiety   []  History of major depression  []  Memory Difficulties      OBJECTIVE:   Physical Exam  BP (!) 145/77 (BP Location: Left Arm, Patient Position: Sitting, Cuff Size: Normal)   Pulse (!) 48   Resp 12   Ht 5\' 4"  (1.626 m)   Wt 154 lb (69.9 kg)   BMI 26.43 kg/m   Gen: WD/WN, NAD Head: [place]/AT, No temporalis wasting.  Ear/Nose/Throat: Hearing grossly intact, nares w/o erythema or drainage Eyes: PER,  EOMI, sclera nonicteric.  Neck: Supple, no masses.  No JVD.  Pulmonary:  Good air movement, no use of accessory muscles.  Cardiac: RRR Vascular: scattered varicosities present bilaterally.  Mild venous stasis changes to the legs bilaterally.  2+ soft pitting edema  Vessel Right Left  Radial Palpable Palpable  Dorsalis Pedis Palpable Palpable  Posterior Tibial Palpable Palpable   Gastrointestinal: soft, non-distended. No guarding/no peritoneal signs.  Musculoskeletal: M/S 5/5 throughout.  No deformity or atrophy.  Neurologic: Pain and light touch intact in extremities.  Symmetrical.  Speech is fluent. Motor exam as listed above. Psychiatric: Judgment intact, Mood & affect appropriate for pt's clinical situation. Dermatologic: No Venous rashes. No Ulcers Noted.  No changes consistent with cellulitis. Lymph : No Cervical lymphadenopathy, no lichenification or skin changes of chronic lymphedema.       ASSESSMENT AND PLAN:  1. Lymphedema Recommend:  No surgery or intervention at this point in time.    I have reviewed my previous discussion with the patient regarding swelling and why it causes symptoms.  Patient will continue wearing graduated compression stockings class 1 (20-30 mmHg) on a daily basis. The patient will  beginning wearing the stockings first thing in the morning and removing them in the evening. The patient is instructed specifically not to sleep in the stockings.    In addition, behavioral modification including several periods of elevation of the lower extremities during the day will be continued.  This was reviewed with the patient during the initial visit.  The patient will also continue routine exercise, especially walking on a daily basis as was discussed during the initial visit.    Despite conservative treatments including graduated compression therapy class 1 and behavioral modification including exercise and elevation the patient  has not obtained adequate  control of the lymphedema.  The patient still has stage 3 lymphedema and therefore, I believe that a lymph pump should be added to improve the control of the patient's lymphedema.  Additionally, a lymph pump is warranted because it will reduce the risk of cellulitis and ulceration in the future.  Patient should follow-up in six months    2. Gastroesophageal reflux disease without esophagitis Continue PPI as already ordered, this medication has been reviewed and there are no changes at this time.  Avoidence of caffeine and alcohol  Moderate elevation of the head of the bed   3. Venous insufficiency of right lower extremity  Recommend:  The patient has large symptomatic varicose veins that are painful and associated with swelling.  I have had a long discussion with the patient regarding  varicose veins and why they cause symptoms.  Patient will begin wearing graduated compression stockings class 1 on a daily basis, beginning first thing in the morning and removing them in the evening. The patient is instructed specifically not to sleep in the stockings.    The patient  will also begin using over-the-counter analgesics such as Motrin 600 mg po TID to help control the symptoms.    In addition, behavioral modification including elevation during the day will be initiated.    Pending the results of these changes the  patient will be reevaluated in three months.   An  ultrasound of the venous system will be obtained.   Further plans will be based on the ultrasound results and whether conservative therapies are successful at eliminating the pain and swelling.    Current Outpatient Medications on File Prior to Visit  Medication Sig Dispense Refill  . acetaminophen (TYLENOL) 500 MG tablet Take 500 mg by mouth every 6 (six) hours as needed for mild pain.     Marland Kitchen amitriptyline (ELAVIL) 25 MG tablet TAKE 1/2 TABLET BY MOUTH AT BEDTIME AS NEEDED    . aspirin EC 81 MG tablet Take 81 mg by mouth at  bedtime.     . clindamycin (CLEOCIN) 300 MG capsule Take 1 capsule (300 mg total) by mouth  4 (four) times daily. 28 capsule 0  . famotidine (PEPCID) 40 MG tablet Take 40 mg by mouth daily.    Marland Kitchen HYDROcodone-acetaminophen (NORCO/VICODIN) 5-325 MG tablet Take 1 tablet by mouth every 4 (four) hours as needed for moderate pain. 12 tablet 0  . ibuprofen (ADVIL,MOTRIN) 200 MG tablet Take 200 mg by mouth every 6 (six) hours as needed for moderate pain.     Marland Kitchen lisinopril (PRINIVIL,ZESTRIL) 20 MG tablet Take 20 mg by mouth daily.   11  . metoprolol tartrate (LOPRESSOR) 25 MG tablet TAKE 1/2 TABLET BY MOUTH TWICE DAILY    . Multiple Vitamin (MULTI-VITAMINS) TABS Take 1 tablet by mouth daily.     Marland Kitchen nystatin cream (MYCOSTATIN) Apply one application to affected area as needed.    . pantoprazole (PROTONIX) 40 MG tablet Take 1 tablet (40 mg total) by mouth 2 (two) times daily. 60 tablet 11  . polyethylene glycol (MIRALAX / GLYCOLAX) packet Take 17 g by mouth daily.     . ranitidine (ZANTAC) 150 MG tablet TAKE 1 TABLET BY MOUTH NIGHTLY    . simvastatin (ZOCOR) 40 MG tablet TAKE 1 TABLET BY MOUTH EVERY DAY    . spironolactone (ALDACTONE) 25 MG tablet TAKE 1/2 TABLET BY MOUTH EVERY DAY    . traMADol (ULTRAM) 50 MG tablet Take 1 tablet (50 mg total) by mouth every 6 (six) hours as needed. 20 tablet 0   No current facility-administered medications on file prior to visit.     There are no Patient Instructions on file for this visit. No follow-ups on file.   Kris Hartmann, NP  This note was completed with Sales executive.  Any errors are purely unintentional.

## 2018-09-06 ENCOUNTER — Encounter (INDEPENDENT_AMBULATORY_CARE_PROVIDER_SITE_OTHER): Payer: 59 | Admitting: Vascular Surgery

## 2019-03-01 ENCOUNTER — Ambulatory Visit (INDEPENDENT_AMBULATORY_CARE_PROVIDER_SITE_OTHER): Payer: Medicare Other | Admitting: Vascular Surgery

## 2019-04-10 ENCOUNTER — Inpatient Hospital Stay
Admission: EM | Admit: 2019-04-10 | Discharge: 2019-04-14 | DRG: 481 | Disposition: A | Discharge status: Skilled Nursing Facility | Payer: Medicare Other | Attending: Internal Medicine | Admitting: Internal Medicine

## 2019-04-10 ENCOUNTER — Encounter: Payer: Self-pay | Admitting: Emergency Medicine

## 2019-04-10 ENCOUNTER — Emergency Department: Payer: Medicare Other

## 2019-04-10 DIAGNOSIS — I2510 Atherosclerotic heart disease of native coronary artery without angina pectoris: Secondary | ICD-10-CM | POA: Diagnosis present

## 2019-04-10 DIAGNOSIS — I352 Nonrheumatic aortic (valve) stenosis with insufficiency: Secondary | ICD-10-CM | POA: Diagnosis present

## 2019-04-10 DIAGNOSIS — M5136 Other intervertebral disc degeneration, lumbar region: Secondary | ICD-10-CM | POA: Diagnosis present

## 2019-04-10 DIAGNOSIS — Z882 Allergy status to sulfonamides status: Secondary | ICD-10-CM | POA: Diagnosis not present

## 2019-04-10 DIAGNOSIS — Z87891 Personal history of nicotine dependence: Secondary | ICD-10-CM | POA: Diagnosis not present

## 2019-04-10 DIAGNOSIS — I110 Hypertensive heart disease with heart failure: Secondary | ICD-10-CM | POA: Diagnosis present

## 2019-04-10 DIAGNOSIS — I5032 Chronic diastolic (congestive) heart failure: Secondary | ICD-10-CM | POA: Diagnosis present

## 2019-04-10 DIAGNOSIS — Z803 Family history of malignant neoplasm of breast: Secondary | ICD-10-CM | POA: Diagnosis not present

## 2019-04-10 DIAGNOSIS — E7800 Pure hypercholesterolemia, unspecified: Secondary | ICD-10-CM | POA: Diagnosis present

## 2019-04-10 DIAGNOSIS — Z974 Presence of external hearing-aid: Secondary | ICD-10-CM | POA: Diagnosis not present

## 2019-04-10 DIAGNOSIS — S72002A Fracture of unspecified part of neck of left femur, initial encounter for closed fracture: Secondary | ICD-10-CM

## 2019-04-10 DIAGNOSIS — Z7982 Long term (current) use of aspirin: Secondary | ICD-10-CM

## 2019-04-10 DIAGNOSIS — Y92015 Private garage of single-family (private) house as the place of occurrence of the external cause: Secondary | ICD-10-CM

## 2019-04-10 DIAGNOSIS — M48062 Spinal stenosis, lumbar region with neurogenic claudication: Secondary | ICD-10-CM | POA: Diagnosis present

## 2019-04-10 DIAGNOSIS — Z8673 Personal history of transient ischemic attack (TIA), and cerebral infarction without residual deficits: Secondary | ICD-10-CM

## 2019-04-10 DIAGNOSIS — W19XXXA Unspecified fall, initial encounter: Secondary | ICD-10-CM

## 2019-04-10 DIAGNOSIS — S62521A Displaced fracture of distal phalanx of right thumb, initial encounter for closed fracture: Secondary | ICD-10-CM | POA: Diagnosis not present

## 2019-04-10 DIAGNOSIS — M25552 Pain in left hip: Secondary | ICD-10-CM | POA: Diagnosis present

## 2019-04-10 DIAGNOSIS — Z20822 Contact with and (suspected) exposure to covid-19: Secondary | ICD-10-CM | POA: Diagnosis present

## 2019-04-10 DIAGNOSIS — K219 Gastro-esophageal reflux disease without esophagitis: Secondary | ICD-10-CM | POA: Diagnosis present

## 2019-04-10 DIAGNOSIS — R230 Cyanosis: Secondary | ICD-10-CM

## 2019-04-10 DIAGNOSIS — S62521D Displaced fracture of distal phalanx of right thumb, subsequent encounter for fracture with routine healing: Secondary | ICD-10-CM | POA: Diagnosis not present

## 2019-04-10 DIAGNOSIS — E1165 Type 2 diabetes mellitus with hyperglycemia: Secondary | ICD-10-CM | POA: Diagnosis not present

## 2019-04-10 DIAGNOSIS — S72142D Displaced intertrochanteric fracture of left femur, subsequent encounter for closed fracture with routine healing: Secondary | ICD-10-CM | POA: Diagnosis not present

## 2019-04-10 DIAGNOSIS — Z8249 Family history of ischemic heart disease and other diseases of the circulatory system: Secondary | ICD-10-CM | POA: Diagnosis not present

## 2019-04-10 DIAGNOSIS — W010XXA Fall on same level from slipping, tripping and stumbling without subsequent striking against object, initial encounter: Secondary | ICD-10-CM | POA: Diagnosis present

## 2019-04-10 DIAGNOSIS — I5022 Chronic systolic (congestive) heart failure: Secondary | ICD-10-CM | POA: Diagnosis present

## 2019-04-10 DIAGNOSIS — S72142A Displaced intertrochanteric fracture of left femur, initial encounter for closed fracture: Secondary | ICD-10-CM | POA: Diagnosis present

## 2019-04-10 DIAGNOSIS — E782 Mixed hyperlipidemia: Secondary | ICD-10-CM | POA: Diagnosis present

## 2019-04-10 DIAGNOSIS — E119 Type 2 diabetes mellitus without complications: Secondary | ICD-10-CM

## 2019-04-10 DIAGNOSIS — Z951 Presence of aortocoronary bypass graft: Secondary | ICD-10-CM | POA: Diagnosis not present

## 2019-04-10 DIAGNOSIS — M19031 Primary osteoarthritis, right wrist: Secondary | ICD-10-CM | POA: Diagnosis present

## 2019-04-10 DIAGNOSIS — Z01818 Encounter for other preprocedural examination: Secondary | ICD-10-CM | POA: Insufficient documentation

## 2019-04-10 DIAGNOSIS — Z419 Encounter for procedure for purposes other than remedying health state, unspecified: Secondary | ICD-10-CM

## 2019-04-10 LAB — CBC WITH DIFFERENTIAL/PLATELET
Abs Immature Granulocytes: 0.12 10*3/uL — ABNORMAL HIGH (ref 0.00–0.07)
Basophils Absolute: 0 10*3/uL (ref 0.0–0.1)
Basophils Relative: 0 %
Eosinophils Absolute: 0.2 10*3/uL (ref 0.0–0.5)
Eosinophils Relative: 2 %
HCT: 37.1 % (ref 36.0–46.0)
Hemoglobin: 12.4 g/dL (ref 12.0–15.0)
Immature Granulocytes: 1 %
Lymphocytes Relative: 14 %
Lymphs Abs: 1.5 10*3/uL (ref 0.7–4.0)
MCH: 31.2 pg (ref 26.0–34.0)
MCHC: 33.4 g/dL (ref 30.0–36.0)
MCV: 93.5 fL (ref 80.0–100.0)
Monocytes Absolute: 0.5 10*3/uL (ref 0.1–1.0)
Monocytes Relative: 5 %
Neutro Abs: 8.6 10*3/uL — ABNORMAL HIGH (ref 1.7–7.7)
Neutrophils Relative %: 78 %
Platelets: 231 10*3/uL (ref 150–400)
RBC: 3.97 MIL/uL (ref 3.87–5.11)
RDW: 14 % (ref 11.5–15.5)
WBC: 11 10*3/uL — ABNORMAL HIGH (ref 4.0–10.5)
nRBC: 0 % (ref 0.0–0.2)

## 2019-04-10 LAB — BASIC METABOLIC PANEL
Anion gap: 9 (ref 5–15)
BUN: 13 mg/dL (ref 8–23)
CO2: 22 mmol/L (ref 22–32)
Calcium: 9.6 mg/dL (ref 8.9–10.3)
Chloride: 106 mmol/L (ref 98–111)
Creatinine, Ser: 0.66 mg/dL (ref 0.44–1.00)
GFR calc Af Amer: >60 mL/min (ref >60)
GFR calc non Af Amer: >60 mL/min (ref >60)
Glucose, Bld: 177 mg/dL — ABNORMAL HIGH (ref 70–99)
Potassium: 3.8 mmol/L (ref 3.5–5.1)
Sodium: 137 mmol/L (ref 135–145)

## 2019-04-10 LAB — PROTIME-INR
INR: 1.1 (ref 0.8–1.2)
Prothrombin Time: 13.6 seconds (ref 11.4–15.2)

## 2019-04-10 MED ORDER — HYDROCODONE-ACETAMINOPHEN 5-325 MG PO TABS
1.0000 | ORAL_TABLET | Freq: Four times a day (QID) | ORAL | Status: DC | PRN
  Administered 2019-04-11: 1 via ORAL
  Filled 2019-04-10: qty 1

## 2019-04-10 MED ORDER — ZOLPIDEM TARTRATE 5 MG PO TABS: 5.0000 mg | ORAL_TABLET | Freq: Every evening | ORAL | Status: DC | PRN

## 2019-04-10 MED ORDER — CEFAZOLIN SODIUM-DEXTROSE 1-4 GM/50ML-% IV SOLN
1.0000 g | Freq: Once | INTRAVENOUS | Status: AC
  Administered 2019-04-11: 1 g via INTRAVENOUS
  Filled 2019-04-10: qty 50

## 2019-04-10 MED ORDER — INSULIN ASPART 100 UNIT/ML ~~LOC~~ SOLN
0.0000 [IU] | Freq: Three times a day (TID) | SUBCUTANEOUS | Status: DC
  Administered 2019-04-11: 5 [IU] via SUBCUTANEOUS
  Administered 2019-04-12 (×2): 3 [IU] via SUBCUTANEOUS
  Administered 2019-04-13: 2 [IU] via SUBCUTANEOUS
  Administered 2019-04-13 – 2019-04-14 (×4): 3 [IU] via SUBCUTANEOUS
  Filled 2019-04-10 (×8): qty 1

## 2019-04-10 MED ORDER — MORPHINE SULFATE (PF) 2 MG/ML IV SOLN
0.5000 mg | INTRAVENOUS | Status: DC | PRN
  Administered 2019-04-10 – 2019-04-11 (×3): 0.5 mg via INTRAVENOUS
  Filled 2019-04-10 (×3): qty 1

## 2019-04-10 MED ORDER — SENNOSIDES-DOCUSATE SODIUM 8.6-50 MG PO TABS
1.0000 | ORAL_TABLET | Freq: Every evening | ORAL | Status: DC | PRN
  Filled 2019-04-10: qty 1

## 2019-04-10 NOTE — ED Provider Notes (Signed)
[facility] Emergency Department Provider Note  ____________________________________________   First MD Initiated Contact with Patient 04/10/19 2109     (approximate)  I have reviewed the triage vital signs and the nursing notes.  History  Chief Complaint Fall    HPI Megan Salinas is a 86 y.o. female who presents to the ER for mechanical fall with resultant left hip pain. She states she had just backed out her car out of the car port, hoping the rain would help clean/wash the car. She sat in the car for a few minutes while it was raining. She pulled back under the car port and upon getting out of the car she lost her balance and slipped and fell. She hit the back of her head and her left hip. Complains of pain primarily in the left hip. Moderate in severity, does not radiation, worse with movement and improved by laying still. No numbness or tingling. No LOC. Not on anticoagulation.    Past Medical Hx Past Medical History:  Diagnosis Date  . Back pain   . CAD (coronary artery disease)   . CHF (congestive heart failure) ([place])   . GERD (gastroesophageal reflux disease)   . High cholesterol   . Hypertension   . TIA (transient ischemic attack) 01/2016   "possible", no deficits  . Wears hearing aid    bilateral    Problem List Patient Active Problem List   Diagnosis Date Noted  . Lymphedema 09/05/2018  . Primary osteoarthritis of right wrist 02/22/2018  . Chest pain 02/28/2017  . Venous insufficiency of right lower extremity 09/02/2016  . Swelling of limb 08/15/2016  . Frequent PVCs 03/12/2016  . Transient global amnesia 02/26/2016  . TIA (transient ischemic attack) 01/28/2016  . Carpal tunnel syndrome, right 08/13/2015  . DDD (degenerative disc disease), lumbar 09/12/2014  . Lumbar radiculitis 09/12/2014  . Lumbar stenosis with neurogenic claudication 09/12/2014  . GERD (gastroesophageal reflux disease) 09/08/2014  . History of type 2  diabetes mellitus 09/08/2014  . Benign essential hypertension 06/16/2014  . Moderate tricuspid insufficiency 06/07/2014  . Abdominal pain, diffuse 03/09/2014  . Chronic systolic CHF (congestive heart failure), NYHA class 3 ([place]) 12/05/2013  . Moderate mitral insufficiency 12/05/2013  . Coronary artery disease 03/29/2013  . Mixed hyperlipidemia 03/29/2013    Past Surgical Hx Past Surgical History:  Procedure Laterality Date  . ABDOMINAL HYSTERECTOMY  1998  . APPENDECTOMY    . baldder tac  1995  . BREAST CYST ASPIRATION Right   . CARPAL TUNNEL RELEASE Right 03/26/2016   Procedure: CARPAL TUNNEL RELEASE ENDOSCOPIC  right;  Surgeon: Corky Mull, MD;  Location: [facility];  Service: Orthopedics;  Laterality: Right;  bier block  . CHOLECYSTECTOMY    . CORONARY ARTERY BYPASS GRAFT  02/15/2010   [facility], 3 vessel  . TONSILLECTOMY      Medications Prior to Admission medications   Medication Sig Start Date End Date Taking? Authorizing Provider  acetaminophen (TYLENOL) 500 MG tablet Take 500 mg by mouth every 6 (six) hours as needed for mild pain.     [provider]  amitriptyline (ELAVIL) 25 MG tablet TAKE 1/2 TABLET BY MOUTH AT BEDTIME AS NEEDED 09/12/15   [provider]  aspirin EC 81 MG tablet Take 81 mg by mouth at bedtime.     [provider]  clindamycin (CLEOCIN) 300 MG capsule Take 1 capsule (300 mg total) by mouth 4 (four) times daily. 06/01/18   Cuthriell, Roderic Palau  D, PA-C  famotidine (PEPCID) 40 MG tablet Take 40 mg by mouth daily.    [provider]  HYDROcodone-acetaminophen (NORCO/VICODIN) 5-325 MG tablet Take 1 tablet by mouth every 4 (four) hours as needed for moderate pain. 06/01/18   Cuthriell, Charline Bills, PA-C  ibuprofen (ADVIL,MOTRIN) 200 MG tablet Take 200 mg by mouth every 6 (six) hours as needed for moderate pain.     [provider]  lisinopril (PRINIVIL,ZESTRIL) 20 MG tablet Take 20 mg by mouth daily.  05/19/16    [provider]  metoprolol tartrate (LOPRESSOR) 25 MG tablet TAKE 1/2 TABLET BY MOUTH TWICE DAILY 06/26/15   [provider]  Multiple Vitamin (MULTI-VITAMINS) TABS Take 1 tablet by mouth daily.     [provider]  nystatin cream (MYCOSTATIN) Apply one application to affected area as needed. 09/10/15   [provider]  pantoprazole (PROTONIX) 40 MG tablet Take 1 tablet (40 mg total) by mouth 2 (two) times daily. 11/01/15   Lucilla Lame, MD  polyethylene glycol (MIRALAX / Floria Raveling) packet Take 17 g by mouth daily.     [provider]  ranitidine (ZANTAC) 150 MG tablet TAKE 1 TABLET BY MOUTH NIGHTLY 10/29/15   [provider]  simvastatin (ZOCOR) 40 MG tablet TAKE 1 TABLET BY MOUTH EVERY DAY 05/28/15   [provider]  spironolactone (ALDACTONE) 25 MG tablet TAKE 1/2 TABLET BY MOUTH EVERY DAY 09/06/15   [provider]  traMADol (ULTRAM) 50 MG tablet Take 1 tablet (50 mg total) by mouth every 6 (six) hours as needed. 03/26/16   Poggi, Marshall Cork, MD    Allergies Sulfa antibiotics  Family Hx Family History  Problem Relation Age of Onset  . Breast cancer Paternal Aunt        74's  . Hypertension Mother     Social Hx Social History   Tobacco Use  . Smoking status: Former Research scientist (life sciences)  . Smokeless tobacco: Never Used  . Tobacco comment: quit over 50 yrs ago  Substance Use Topics  . Alcohol use: Yes    Alcohol/week: 5.0 standard drinks    Types: 5 Glasses of wine per week    Comment:    . Drug use: No     Review of Systems  Constitutional: Negative for fever. Negative for chills. Eyes: Negative for visual changes. ENT: Negative for sore throat. Cardiovascular: Negative for chest pain. Respiratory: Negative for shortness of breath. Gastrointestinal: Negative for nausea. Negative for vomiting.  Genitourinary: Negative for dysuria. Musculoskeletal: Negative for leg swelling. + hip pain Skin: Negative for  rash. Neurological: Negative for headaches.   Physical Exam  Vital Signs: ED Triage Vitals [04/10/19 2054]  Enc Vitals Group     BP (!) 172/110     Pulse Rate 79     Resp 17     Temp 98.6 F (37 C)     Temp Source Oral     SpO2 100 %     Weight      Height      Head Circumference      Peak Flow      Pain Score      Pain Loc      Pain Edu?      Excl. in [place]?     Constitutional: Alert and oriented. Well appearing. NAD.  Head: Normocephalic. Atraumatic. Eyes: Conjunctivae clear. Sclera anicteric. Pupils equal and symmetric. Nose: No masses or lesions. No congestion or rhinorrhea. Mouth/Throat: Wearing mask.  Neck: No stridor.  Trachea midline. No midline CS tenderness.  Cardiovascular: Normal rate, regular rhythm. Extremities well perfused. Respiratory: Normal respiratory effort.  Lungs CTAB. Gastrointestinal: Soft. Non-distended. Non-tender.  Genitourinary: Deferred. Musculoskeletal: TTP about LEFT hip, LLE appears shorted and externally rotated. Remainder of exam, RLE, BUE w/ FROM, no deformities and NT to palpation.  Neurologic:  Normal speech and language. No gross focal or lateralizing neurologic deficits are appreciated.  Skin: Skin is warm, dry and intact. No rash noted. Psychiatric: Mood and affect are appropriate for situation.  EKG  Personally reviewed and interpreted by myself.   Rate: 77 Rhythm: sinus Axis: normal Intervals: WNL No STEMI    Radiology  CT head/CS  IMPRESSION:  CT of the head: Chronic atrophic and ischemic changes without acute intracranial abnormality.  Chronic left mastoid opacification  CT of the cervical spine: Multilevel degenerative changes without acute abnormality.   Multiple small hypodensities and calcifications noted throughout the thyroid. No follow-up recommended unless clinically warranted (ref: J Am Coll Radiol. 2015 Feb;12(2): 143-50).   XR hip left  IMPRESSION:  Acute displaced intratrochanteric fracture of the  proximal left femur.   Procedures  Procedure(s) performed (including critical care):  Procedures   Initial Impression / Assessment and Plan / MDM / ED Course  86 y.o. female who presents to the ED for a non-syncopal fall, as above.  Imaging reveals no acute head or CS injury. XR reveals left hip fracture. Discussed with orthopedics. Will obtain basic screening labs, EKG, COVID swab and admit. Patient updated on results and plan of care and voices understanding.    _______________________________   As part of my medical decision making I have reviewed available labs, radiology tests, reviewed old records, and discussed with consultants (ortho, Dr. Posey Pronto).   Final Clinical Impression(s) / ED Diagnosis  Final diagnoses:  Fall, initial encounter  Closed fracture of left hip, initial encounter [facility])       Note:  This document was prepared using Dragon voice recognition software and may include unintentional dictation errors.   Lilia Pro., MD 04/11/19 [id number]

## 2019-04-10 NOTE — ED Notes (Signed)
Admitting Provider at bedside.  Pt to be in OR Monday noon

## 2019-04-10 NOTE — H&P (Signed)
History and Physical    Megan Salinas [id number] DOB: 1926-02-09 DOA: 04/10/2019  PCP: Megan Hire, MD   Patient coming from: [facility]  I have personally briefly reviewed patient's old medical records in [place]  Chief Complaint: fall  HPI: Megan Salinas is a 86 y.o. female with medical history significant for chronic systolic heart failure, last EF 45 to 50% in February 2019, coronary artery disease s/p CABG in 2012, hypertension and type 2 diabetes who presents to the emergency room following an accidental fall.  Patient fell as she was getting out of her car after pulling into her garage.  She denied preceding lightheadedness, numbness weakness or tingling in the face or extremities, or preceding palpitations, blurred vision, headache, chest pain palpitations or shortness of breath.  Prior to the fall she was in her usual state of health.  States that she has a painful left knee and sometimes her knee gives out and she thinks this is what happened tonight . ED Course: On arrival to the emergency room she was hypertensive at 172/110 with otherwise normal vitals.   Hip x-ray showed left displaced intertrochanteric fracture.  Head and C-spine CTs showed no acute injury.  EKG normal sinus rhythm with no acute ST-T wave changes.  Blood work and chest x-ray pending at the time of decision to admit.  Review of Systems: As per HPI otherwise 10 point review of systems negative.    Past Medical History:  Diagnosis Date  . Back pain   . CAD (coronary artery disease)   . CHF (congestive heart failure) (Megan Salinas)   . GERD (gastroesophageal reflux disease)   . High cholesterol   . Hypertension   . TIA (transient ischemic attack) 01/2016   "possible", no deficits  . Wears hearing aid    bilateral    Past Surgical History:  Procedure Laterality Date  . ABDOMINAL HYSTERECTOMY  1998  . APPENDECTOMY    . baldder tac  1995  . BREAST CYST ASPIRATION Right   . CARPAL TUNNEL  RELEASE Right 03/26/2016   Procedure: CARPAL TUNNEL RELEASE ENDOSCOPIC  right;  Surgeon: Corky Mull, MD;  Location: [facility];  Service: Orthopedics;  Laterality: Right;  bier block  . CHOLECYSTECTOMY    . CORONARY ARTERY BYPASS GRAFT  02/15/2010   [facility], 3 vessel  . TONSILLECTOMY       reports that she has quit smoking. She has never used smokeless tobacco. She reports current alcohol use of about 5.0 standard drinks of alcohol per week. She reports that she does not use drugs.  Allergies  Allergen Reactions  . Sulfa Antibiotics Itching and Rash    Red, itchy palms    Family History  Problem Relation Age of Onset  . Breast cancer Paternal Aunt        53's  . Hypertension Mother      Prior to Admission medications   Medication Sig Start Date End Date Taking? Authorizing Provider  acetaminophen (TYLENOL) 500 MG tablet Take 500 mg by mouth every 6 (six) hours as needed for mild pain.     [provider]  amitriptyline (ELAVIL) 25 MG tablet TAKE 1/2 TABLET BY MOUTH AT BEDTIME AS NEEDED 09/12/15   [provider]  aspirin EC 81 MG tablet Take 81 mg by mouth at bedtime.     [provider]  clindamycin (CLEOCIN) 300 MG capsule Take 1 capsule (300 mg total) by mouth 4 (four) times daily. 06/01/18  Cuthriell, Charline Bills, PA-C  famotidine (PEPCID) 40 MG tablet Take 40 mg by mouth daily.    [provider]  HYDROcodone-acetaminophen (NORCO/VICODIN) 5-325 MG tablet Take 1 tablet by mouth every 4 (four) hours as needed for moderate pain. 06/01/18   Cuthriell, Charline Bills, PA-C  ibuprofen (ADVIL,MOTRIN) 200 MG tablet Take 200 mg by mouth every 6 (six) hours as needed for moderate pain.     [provider]  lisinopril (PRINIVIL,ZESTRIL) 20 MG tablet Take 20 mg by mouth daily.  05/19/16   [provider]  metoprolol tartrate (LOPRESSOR) 25 MG tablet TAKE 1/2 TABLET BY MOUTH TWICE DAILY 06/26/15   [provider]  Multiple Vitamin  (MULTI-VITAMINS) TABS Take 1 tablet by mouth daily.     [provider]  nystatin cream (MYCOSTATIN) Apply one application to affected area as needed. 09/10/15   [provider]  pantoprazole (PROTONIX) 40 MG tablet Take 1 tablet (40 mg total) by mouth 2 (two) times daily. 11/01/15   Lucilla Lame, MD  polyethylene glycol (MIRALAX / Floria Raveling) packet Take 17 g by mouth daily.     [provider]  ranitidine (ZANTAC) 150 MG tablet TAKE 1 TABLET BY MOUTH NIGHTLY 10/29/15   [provider]  simvastatin (ZOCOR) 40 MG tablet TAKE 1 TABLET BY MOUTH EVERY DAY 05/28/15   [provider]  spironolactone (ALDACTONE) 25 MG tablet TAKE 1/2 TABLET BY MOUTH EVERY DAY 09/06/15   [provider]  traMADol (ULTRAM) 50 MG tablet Take 1 tablet (50 mg total) by mouth every 6 (six) hours as needed. 03/26/16   PoggiMarshall Cork, MD    Physical Exam: Vitals:   04/10/19 2054  BP: (!) 172/110  Pulse: 79  Resp: 17  Temp: 98.6 F (37 C)  TempSrc: Oral  SpO2: 100%     Vitals:   04/10/19 2054  BP: (!) 172/110  Pulse: 79  Resp: 17  Temp: 98.6 F (37 C)  TempSrc: Oral  SpO2: 100%    Constitutional: Alert and awake, oriented x3, not in any acute distress. Eyes: PERLA, EOMI, irises appear normal, anicteric sclera,  ENMT: external ears and nose appear normal, normal hearing             Lips appears normal, oropharynx mucosa, tongue, posterior pharynx appear normal  Neck: neck appears normal, no masses, normal ROM, no thyromegaly, no JVD  CVS: S1-S2 clear, no murmur rubs or gallops,  , no carotid bruits, pedal pulses palpable, 1+ bilateral LE edema Respiratory:  clear to auscultation bilaterally, no wheezing, rales or rhonchi. Respiratory effort normal. No accessory muscle use.  Abdomen: soft nontender, nondistended, normal bowel sounds, no hepatosplenomegaly, no hernias Musculoskeletal: : Left hip and flexion abduction and external rotation Neuro: Cranial nerves  II-XII intact, sensation, reflexes normal, strength Psych: judgement and insight appear normal, stable mood and affect,  Skin: no rashes or lesions or ulcers, no induration or nodules   Labs on Admission: I have personally reviewed following labs and imaging studies  CBC: Recent Labs  Lab 04/10/19 2245  WBC 11.0*  NEUTROABS 8.6*  HGB 12.4  HCT 37.1  MCV 93.5  PLT [id number]   Basic Metabolic Panel: No results for input(s): NA, K, CL, CO2, GLUCOSE, BUN, CREATININE, CALCIUM, MG, PHOS in the last 168 hours. GFR: CrCl cannot be calculated (Unknown ideal weight.). Liver Function Tests: No results for input(s): AST, ALT, ALKPHOS, BILITOT, PROT, ALBUMIN in the last 168 hours. No results for input(s): LIPASE, AMYLASE in the  last 168 hours. No results for input(s): AMMONIA in the last 168 hours. Coagulation Profile: No results for input(s): INR, PROTIME in the last 168 hours. Cardiac Enzymes: No results for input(s): CKTOTAL, CKMB, CKMBINDEX, TROPONINI in the last 168 hours. BNP (last 3 results) No results for input(s): PROBNP in the last 8760 hours. HbA1C: No results for input(s): HGBA1C in the last 72 hours. CBG: No results for input(s): GLUCAP in the last 168 hours. Lipid Profile: No results for input(s): CHOL, HDL, LDLCALC, TRIG, CHOLHDL, LDLDIRECT in the last 72 hours. Thyroid Function Tests: No results for input(s): TSH, T4TOTAL, FREET4, T3FREE, THYROIDAB in the last 72 hours. Anemia Panel: No results for input(s): VITAMINB12, FOLATE, FERRITIN, TIBC, IRON, RETICCTPCT in the last 72 hours. Urine analysis:    Component Value Date/Time   COLORURINE Yellow 09/19/2013 1329   APPEARANCEUR Clear 09/19/2013 1329   LABSPEC 1.014 09/19/2013 1329   PHURINE 5.0 09/19/2013 1329   GLUCOSEU Negative 09/19/2013 1329   HGBUR Negative 09/19/2013 1329   BILIRUBINUR Negative 09/19/2013 1329   KETONESUR Negative 09/19/2013 1329   PROTEINUR Negative 09/19/2013 1329   NITRITE Negative 09/19/2013  1329   LEUKOCYTESUR Negative 09/19/2013 1329    Radiological Exams on Admission: CT Head Wo Contrast  Result Date: 04/10/2019 CLINICAL DATA:  Recent fall with headaches and neck pain, initial encounter EXAM: CT HEAD WITHOUT CONTRAST CT CERVICAL SPINE WITHOUT CONTRAST TECHNIQUE: Multidetector CT imaging of the head and cervical spine was performed following the standard protocol without intravenous contrast. Multiplanar CT image reconstructions of the cervical spine were also generated. COMPARISON:  01/28/2016, 01/14/2018 FINDINGS: CT HEAD FINDINGS Brain: No evidence of acute infarction, hemorrhage, hydrocephalus, extra-axial collection or mass lesion/mass effect. Chronic atrophic and ischemic changes are again noted and stable. Vascular: No hyperdense vessel or unexpected calcification. Skull: Normal. Negative for fracture or focal lesion. Sinuses/Orbits: Mucosal retention cyst is noted in the left maxillary antrum stable from the prior exams. The paranasal sinuses are otherwise within normal limits. Mastoid air cells on the left demonstrate opacification. This is stable from the prior exam of 2020. Other: None. CT CERVICAL SPINE FINDINGS Alignment: Within normal limits. Skull base and vertebrae: 7 cervical segments are well visualized. Vertebral body height is well maintained. Multilevel facet hypertrophic changes and osteophytic changes are seen. No acute fracture or acute facet abnormality is noted. Neural foraminal narrowing is noted bilaterally. Soft tissues and spinal canal: Scattered calcifications and hypodense nodular changes are noted throughout the thyroid. None of these are significant by size criteria. The remaining soft tissues of the neck are within normal limits. Upper chest: Visualized lung apices are within normal limits. Other: None IMPRESSION: CT of the head: Chronic atrophic and ischemic changes without acute intracranial abnormality. Chronic left mastoid opacification CT of the cervical  spine: Multilevel degenerative changes without acute abnormality. Multiple small hypodensities and calcifications noted throughout the thyroid. No follow-up recommended unless clinically warranted (ref: J Am Coll Radiol. 2015 Feb;12(2): 143-50). Electronically Signed   By: Inez Catalina M.D.   On: 04/10/2019 21:35   CT Cervical Spine Wo Contrast  Result Date: 04/10/2019 CLINICAL DATA:  Recent fall with headaches and neck pain, initial encounter EXAM: CT HEAD WITHOUT CONTRAST CT CERVICAL SPINE WITHOUT CONTRAST TECHNIQUE: Multidetector CT imaging of the head and cervical spine was performed following the standard protocol without intravenous contrast. Multiplanar CT image reconstructions of the cervical spine were also generated. COMPARISON:  01/28/2016, 01/14/2018 FINDINGS: CT HEAD FINDINGS Brain: No evidence of acute infarction, hemorrhage, hydrocephalus,  extra-axial collection or mass lesion/mass effect. Chronic atrophic and ischemic changes are again noted and stable. Vascular: No hyperdense vessel or unexpected calcification. Skull: Normal. Negative for fracture or focal lesion. Sinuses/Orbits: Mucosal retention cyst is noted in the left maxillary antrum stable from the prior exams. The paranasal sinuses are otherwise within normal limits. Mastoid air cells on the left demonstrate opacification. This is stable from the prior exam of 2020. Other: None. CT CERVICAL SPINE FINDINGS Alignment: Within normal limits. Skull base and vertebrae: 7 cervical segments are well visualized. Vertebral body height is well maintained. Multilevel facet hypertrophic changes and osteophytic changes are seen. No acute fracture or acute facet abnormality is noted. Neural foraminal narrowing is noted bilaterally. Soft tissues and spinal canal: Scattered calcifications and hypodense nodular changes are noted throughout the thyroid. None of these are significant by size criteria. The remaining soft tissues of the neck are within normal  limits. Upper chest: Visualized lung apices are within normal limits. Other: None IMPRESSION: CT of the head: Chronic atrophic and ischemic changes without acute intracranial abnormality. Chronic left mastoid opacification CT of the cervical spine: Multilevel degenerative changes without acute abnormality. Multiple small hypodensities and calcifications noted throughout the thyroid. No follow-up recommended unless clinically warranted (ref: J Am Coll Radiol. 2015 Feb;12(2): 143-50). Electronically Signed   By: Inez Catalina M.D.   On: 04/10/2019 21:35   DG Chest Port 1 View  Result Date: 04/10/2019 CLINICAL DATA:  Preop EXAM: PORTABLE CHEST 1 VIEW COMPARISON:  02/28/2017 FINDINGS: Increased interstitial markings. This appearance favors chronic interstitial lung disease over interstitial edema or mild pneumonia. Mild right lower lung opacity favors atelectasis. No pleural effusion or pneumothorax. Cardiomegaly. Postsurgical changes related to prior CABG. Thoracic aortic atherosclerosis. Degenerative changes of the bilateral shoulders. IMPRESSION: Increased interstitial markings, favoring chronic interstitial lung disease, with mild right lower lobe atelectasis. Electronically Signed   By: Julian Hy M.D.   On: 04/10/2019 22:28   DG HIP UNILAT WITH PELVIS 2-3 VIEWS LEFT  Result Date: 04/10/2019 CLINICAL DATA:  Pain EXAM: DG HIP (WITH OR WITHOUT PELVIS) 2-3V LEFT COMPARISON:  None. FINDINGS: There is an acute displaced intratrochanteric fracture of the proximal left femur. Multiple displaced fracture fragments are noted. There is diffuse osteopenia. There are degenerative changes of both hips. There is no dislocation. Vascular calcifications are noted. IMPRESSION: Acute displaced intratrochanteric fracture of the proximal left femur. Electronically Signed   By: Constance Holster M.D.   On: 04/10/2019 21:53    EKG: Independently reviewed.   Assessment/Plan Principal Problem:   Displaced  intertrochanteric fracture of left femur (HCC)   Preop examination   Accidental fall -Patient  suffered an accidental fall after exiting her car -Moderate risk for perioperative cardiopulmonary events and surgery should not be delayed however, she voices preference to have her cardiology group clear her for surgery -Telemetry monitoring evaluate for arrhythmias -Echocardiogram in the a.m. -Pain medication -Hold aspirin -Orthopedic consult for further management -Cardiology consult for preoperative clearance, per patient request    Chronic systolic CHF (congestive heart failure), NYHA class 3 ([place]) -Not acutely exacerbated -Continue home meds until n.p.o. on metoprolol and lisinopril spironolactone and simvastatin    Coronary artery disease -No complaints of chest pain and no acute findings on EKG -Hold aspirin but continue simvastatin and metoprolol    Type 2 diabetes mellitus without complication ([place]) -Most recent A1c at doctor's office was 6.8 within the past month -Sliding scale insulin coverage      DVT prophylaxis: SCD  Code Status: full code  Family Communication:  Daughter, Lattie Haw  Disposition Plan: Back to previous home environment Consults called: Dr s Posey Pronto, ortho, Dr Saralyn Pilar, cardiology  Status:inp    [name] MD [facility]     04/10/2019, 10:39 PM

## 2019-04-10 NOTE — ED Triage Notes (Addendum)
Pt arrived via EMS from [place] post fall. Per EMS pt tripped while going inside of house. Pt hit posterior head and landed on the left hip. Pt c/o left hip pain. Pt denies LOC.

## 2019-04-10 NOTE — ED Notes (Addendum)
Call from Laqueta Linden, ID'd self as HPOA (daughter), living in [place], reports another sister lives 3 hours away and is on the way to ED, in the meantime please call her with updates cell: [phone]  Denies knowledge of recent fall for pt but reports some cognitive decline owning to pt's age

## 2019-04-10 NOTE — ED Notes (Signed)
Lattie Haw, daughter, ID'd self as HPOA, called and updated, ETA in 3 hours  Phone on speaker in room, Lattie Haw reports she will update [name]

## 2019-04-11 ENCOUNTER — Inpatient Hospital Stay: Payer: Medicare Other

## 2019-04-11 ENCOUNTER — Inpatient Hospital Stay: Payer: Medicare Other | Admitting: Anesthesiology

## 2019-04-11 ENCOUNTER — Other Ambulatory Visit: Payer: Self-pay

## 2019-04-11 ENCOUNTER — Inpatient Hospital Stay
Admit: 2019-04-11 | Discharge: 2019-04-11 | Disposition: A | Discharge status: Home / Self Care | Payer: Medicare Other | Attending: Internal Medicine | Admitting: Internal Medicine

## 2019-04-11 ENCOUNTER — Encounter: Payer: Self-pay | Admitting: Internal Medicine

## 2019-04-11 ENCOUNTER — Encounter
Admission: EM | Disposition: A | Discharge status: Skilled Nursing Facility | Payer: Self-pay | Source: Home / Self Care | Attending: Internal Medicine

## 2019-04-11 DIAGNOSIS — I5022 Chronic systolic (congestive) heart failure: Secondary | ICD-10-CM

## 2019-04-11 DIAGNOSIS — S72142D Displaced intertrochanteric fracture of left femur, subsequent encounter for closed fracture with routine healing: Secondary | ICD-10-CM

## 2019-04-11 HISTORY — PX: INTRAMEDULLARY (IM) NAIL INTERTROCHANTERIC: SHX5875

## 2019-04-11 LAB — GLUCOSE, CAPILLARY
Glucose-Capillary: 219 mg/dL — ABNORMAL HIGH (ref 70–99)
Glucose-Capillary: 114 mg/dL — ABNORMAL HIGH (ref 70–99)
Glucose-Capillary: 128 mg/dL — ABNORMAL HIGH (ref 70–99)
Glucose-Capillary: 121 mg/dL — ABNORMAL HIGH (ref 70–99)
Glucose-Capillary: 260 mg/dL — ABNORMAL HIGH (ref 70–99)

## 2019-04-11 LAB — RESPIRATORY PANEL BY RT PCR (FLU A&B, COVID)
Influenza A by PCR: NEGATIVE
Influenza B by PCR: NEGATIVE
SARS Coronavirus 2 by RT PCR: NEGATIVE

## 2019-04-11 LAB — HEMOGLOBIN A1C
Hgb A1c MFr Bld: 6.8 % — ABNORMAL HIGH (ref 4.8–5.6)
Mean Plasma Glucose: 148.46 mg/dL

## 2019-04-11 LAB — SAMPLE TO BLOOD BANK

## 2019-04-11 LAB — SURGICAL PCR SCREEN
MRSA, PCR: POSITIVE — AB
Staphylococcus aureus: POSITIVE — AB

## 2019-04-11 LAB — ECHOCARDIOGRAM COMPLETE

## 2019-04-11 SURGERY — FIXATION, FRACTURE, INTERTROCHANTERIC, WITH INTRAMEDULLARY ROD
Anesthesia: Choice | Site: Hip | Laterality: Left

## 2019-04-11 MED ORDER — METOCLOPRAMIDE HCL 10 MG PO TABS: 5.0000 mg | ORAL_TABLET | Freq: Three times a day (TID) | ORAL | Status: DC | PRN

## 2019-04-11 MED ORDER — BISACODYL 10 MG RE SUPP
10.0000 mg | Freq: Every day | RECTAL | Status: DC | PRN
  Administered 2019-04-14: 10 mg via RECTAL
  Filled 2019-04-11: qty 1

## 2019-04-11 MED ORDER — PANTOPRAZOLE SODIUM 40 MG PO TBEC
40.0000 mg | DELAYED_RELEASE_TABLET | Freq: Two times a day (BID) | ORAL | Status: DC
  Administered 2019-04-11 – 2019-04-14 (×6): 40 mg via ORAL
  Filled 2019-04-11 (×6): qty 1

## 2019-04-11 MED ORDER — ADULT MULTIVITAMIN W/MINERALS CH
1.0000 | ORAL_TABLET | Freq: Every day | ORAL | Status: DC
  Administered 2019-04-13 – 2019-04-14 (×2): 1 via ORAL
  Filled 2019-04-11 (×3): qty 1

## 2019-04-11 MED ORDER — PROPOFOL 10 MG/ML IV BOLUS
INTRAVENOUS | Status: DC | PRN
  Administered 2019-04-11: 50 ug/kg/min via INTRAVENOUS
  Administered 2019-04-11: 15 mg via INTRAVENOUS
  Administered 2019-04-11: 30 mg via INTRAVENOUS
  Administered 2019-04-11: 10 mg via INTRAVENOUS

## 2019-04-11 MED ORDER — SPIRONOLACTONE 25 MG PO TABS
25.0000 mg | ORAL_TABLET | Freq: Every day | ORAL | Status: DC
  Administered 2019-04-12 – 2019-04-13 (×2): 25 mg via ORAL
  Filled 2019-04-11 (×2): qty 1

## 2019-04-11 MED ORDER — OXYCODONE HCL 5 MG/5ML PO SOLN: 5.0000 mg | Freq: Once | ORAL | Status: DC | PRN

## 2019-04-11 MED ORDER — ACETAMINOPHEN 500 MG PO TABS
1000.0000 mg | ORAL_TABLET | Freq: Three times a day (TID) | ORAL | Status: AC
  Administered 2019-04-11 – 2019-04-12 (×4): 1000 mg via ORAL
  Filled 2019-04-11 (×4): qty 2

## 2019-04-11 MED ORDER — OXYCODONE HCL 5 MG PO TABS
5.0000 mg | ORAL_TABLET | ORAL | Status: DC | PRN
  Administered 2019-04-13: 10 mg via ORAL
  Filled 2019-04-11: qty 2

## 2019-04-11 MED ORDER — PHENYLEPHRINE HCL (PRESSORS) 10 MG/ML IV SOLN
INTRAVENOUS | Status: DC | PRN
  Administered 2019-04-11 (×3): 100 ug via INTRAVENOUS

## 2019-04-11 MED ORDER — EPHEDRINE SULFATE 50 MG/ML IJ SOLN
INTRAMUSCULAR | Status: DC | PRN
  Administered 2019-04-11: 15 mg via INTRAVENOUS

## 2019-04-11 MED ORDER — SENNOSIDES-DOCUSATE SODIUM 8.6-50 MG PO TABS
1.0000 | ORAL_TABLET | Freq: Every evening | ORAL | Status: DC | PRN
  Administered 2019-04-14: 1 via ORAL

## 2019-04-11 MED ORDER — SODIUM CHLORIDE 0.9 % IV SOLN: INTRAVENOUS | Status: DC

## 2019-04-11 MED ORDER — NEOMYCIN-POLYMYXIN B GU 40-200000 IR SOLN
Status: DC | PRN
  Administered 2019-04-11: 4 mL

## 2019-04-11 MED ORDER — SIMVASTATIN 20 MG PO TABS
40.0000 mg | ORAL_TABLET | Freq: Every day | ORAL | Status: DC
  Administered 2019-04-11 – 2019-04-13 (×3): 40 mg via ORAL
  Filled 2019-04-11 (×4): qty 2

## 2019-04-11 MED ORDER — CEFAZOLIN SODIUM-DEXTROSE 1-4 GM/50ML-% IV SOLN
1.0000 g | Freq: Four times a day (QID) | INTRAVENOUS | Status: AC
  Administered 2019-04-11 – 2019-04-12 (×2): 1 g via INTRAVENOUS
  Filled 2019-04-11 (×3): qty 50

## 2019-04-11 MED ORDER — TRAMADOL HCL 50 MG PO TABS
50.0000 mg | ORAL_TABLET | Freq: Four times a day (QID) | ORAL | Status: DC | PRN
  Administered 2019-04-12: 50 mg via ORAL
  Filled 2019-04-11: qty 1

## 2019-04-11 MED ORDER — PHENYLEPHRINE HCL (PRESSORS) 10 MG/ML IV SOLN
INTRAVENOUS | Status: AC
  Filled 2019-04-11: qty 1

## 2019-04-11 MED ORDER — DOCUSATE SODIUM 100 MG PO CAPS
100.0000 mg | ORAL_CAPSULE | Freq: Two times a day (BID) | ORAL | Status: DC
  Administered 2019-04-11 – 2019-04-14 (×6): 100 mg via ORAL
  Filled 2019-04-11 (×6): qty 1

## 2019-04-11 MED ORDER — BUPIVACAINE HCL (PF) 0.5 % IJ SOLN
INTRAMUSCULAR | Status: AC
  Filled 2019-04-11: qty 30

## 2019-04-11 MED ORDER — ONDANSETRON HCL 4 MG/2ML IJ SOLN: 4.0000 mg | Freq: Four times a day (QID) | INTRAMUSCULAR | Status: DC | PRN

## 2019-04-11 MED ORDER — FENTANYL CITRATE (PF) 100 MCG/2ML IJ SOLN
INTRAMUSCULAR | Status: DC | PRN
  Administered 2019-04-11: 25 ug via INTRAVENOUS

## 2019-04-11 MED ORDER — OXYCODONE HCL 5 MG PO TABS: 5.0000 mg | ORAL_TABLET | Freq: Once | ORAL | Status: DC | PRN

## 2019-04-11 MED ORDER — PROPOFOL 10 MG/ML IV BOLUS
INTRAVENOUS | Status: AC
  Filled 2019-04-11: qty 20

## 2019-04-11 MED ORDER — CHLORHEXIDINE GLUCONATE CLOTH 2 % EX PADS
6.0000 | MEDICATED_PAD | Freq: Every day | CUTANEOUS | Status: DC
  Administered 2019-04-11: 6 via TOPICAL

## 2019-04-11 MED ORDER — BUPIVACAINE LIPOSOME 1.3 % IJ SUSP
INTRAMUSCULAR | Status: AC
  Filled 2019-04-11: qty 20

## 2019-04-11 MED ORDER — ONDANSETRON HCL 4 MG PO TABS: 4.0000 mg | ORAL_TABLET | Freq: Four times a day (QID) | ORAL | Status: DC | PRN

## 2019-04-11 MED ORDER — FENTANYL CITRATE (PF) 100 MCG/2ML IJ SOLN: 25.0000 ug | INTRAMUSCULAR | Status: DC | PRN

## 2019-04-11 MED ORDER — SODIUM CHLORIDE 0.9 % IV SOLN: INTRAVENOUS | Status: DC | PRN

## 2019-04-11 MED ORDER — METHOCARBAMOL 1000 MG/10ML IJ SOLN
500.0000 mg | Freq: Four times a day (QID) | INTRAVENOUS | Status: DC | PRN
  Administered 2019-04-11 – 2019-04-13 (×3): 500 mg via INTRAVENOUS
  Filled 2019-04-11 (×4): qty 5

## 2019-04-11 MED ORDER — ACETAMINOPHEN 10 MG/ML IV SOLN: 1000.0000 mg | Freq: Once | INTRAVENOUS | Status: DC | PRN

## 2019-04-11 MED ORDER — HYDROMORPHONE HCL 1 MG/ML IJ SOLN: 0.2500 mg | INTRAMUSCULAR | Status: DC | PRN

## 2019-04-11 MED ORDER — FAMOTIDINE 20 MG PO TABS
20.0000 mg | ORAL_TABLET | Freq: Every day | ORAL | Status: DC
  Administered 2019-04-12 – 2019-04-14 (×3): 20 mg via ORAL
  Filled 2019-04-11 (×3): qty 1

## 2019-04-11 MED ORDER — EPHEDRINE 5 MG/ML INJ
INTRAVENOUS | Status: AC
  Filled 2019-04-11: qty 10

## 2019-04-11 MED ORDER — ONDANSETRON HCL 4 MG/2ML IJ SOLN: 4.0000 mg | Freq: Once | INTRAMUSCULAR | Status: DC | PRN

## 2019-04-11 MED ORDER — ACETAMINOPHEN 325 MG PO TABS
325.0000 mg | ORAL_TABLET | Freq: Four times a day (QID) | ORAL | Status: DC | PRN
  Administered 2019-04-14: 650 mg via ORAL
  Filled 2019-04-11: qty 2

## 2019-04-11 MED ORDER — BUPIVACAINE LIPOSOME 1.3 % IJ SUSP
INTRAMUSCULAR | Status: DC | PRN
  Administered 2019-04-11: 50 mL

## 2019-04-11 MED ORDER — FLEET ENEMA 7-19 GM/118ML RE ENEM: 1.0000 | ENEMA | Freq: Once | RECTAL | Status: DC | PRN

## 2019-04-11 MED ORDER — ENOXAPARIN SODIUM 30 MG/0.3ML ~~LOC~~ SOLN
30.0000 mg | SUBCUTANEOUS | Status: DC
  Administered 2019-04-12 – 2019-04-14 (×3): 30 mg via SUBCUTANEOUS
  Filled 2019-04-11 (×3): qty 0.3

## 2019-04-11 MED ORDER — NEOMYCIN-POLYMYXIN B GU 40-200000 IR SOLN
Status: AC
  Filled 2019-04-11: qty 4

## 2019-04-11 MED ORDER — FENTANYL CITRATE (PF) 100 MCG/2ML IJ SOLN
INTRAMUSCULAR | Status: AC
  Filled 2019-04-11: qty 2

## 2019-04-11 MED ORDER — OXYCODONE HCL 5 MG PO TABS
2.5000 mg | ORAL_TABLET | ORAL | Status: DC | PRN
  Administered 2019-04-12 – 2019-04-13 (×2): 5 mg via ORAL
  Filled 2019-04-11 (×2): qty 1

## 2019-04-11 MED ORDER — METOPROLOL TARTRATE 25 MG PO TABS
12.5000 mg | ORAL_TABLET | Freq: Two times a day (BID) | ORAL | Status: DC
  Administered 2019-04-11 – 2019-04-13 (×5): 12.5 mg via ORAL
  Filled 2019-04-11 (×6): qty 1

## 2019-04-11 MED ORDER — METOCLOPRAMIDE HCL 5 MG/ML IJ SOLN: 5.0000 mg | Freq: Three times a day (TID) | INTRAMUSCULAR | Status: DC | PRN

## 2019-04-11 MED ORDER — MUPIROCIN 2 % EX OINT
1.0000 "application " | TOPICAL_OINTMENT | Freq: Two times a day (BID) | CUTANEOUS | Status: DC
  Administered 2019-04-11 – 2019-04-14 (×7): 1 via NASAL
  Filled 2019-04-11: qty 22

## 2019-04-11 MED ORDER — LISINOPRIL 20 MG PO TABS
20.0000 mg | ORAL_TABLET | Freq: Every day | ORAL | Status: DC
  Administered 2019-04-12 – 2019-04-14 (×3): 20 mg via ORAL
  Filled 2019-04-11 (×3): qty 1

## 2019-04-11 MED ORDER — ENSURE MAX PROTEIN PO LIQD
11.0000 [oz_av] | Freq: Two times a day (BID) | ORAL | Status: DC
  Administered 2019-04-12 – 2019-04-14 (×4): 11 [oz_av] via ORAL
  Filled 2019-04-11 (×2): qty 330

## 2019-04-11 MED ORDER — SODIUM CHLORIDE 0.9 % IR SOLN: Status: DC | PRN

## 2019-04-11 SURGICAL SUPPLY — 51 items
APL PRP STRL LF DISP 70% ISPRP (MISCELLANEOUS) ×1
BIT DRILL 4.0X280 (BIT) ×1 IMPLANT
BIT DRILL 5.0 CALIBRATED STEP (BIT) ×1 IMPLANT
BLADE SURG 15 STRL LF DISP TIS (BLADE) ×1 IMPLANT
BLADE SURG 15 STRL SS (BLADE) ×2
CANISTER SUCT 1200ML W/VALVE (MISCELLANEOUS) ×2 IMPLANT
CHLORAPREP W/TINT 26 (MISCELLANEOUS) ×2 IMPLANT
COVER WAND RF STERILE (DRAPES) ×2 IMPLANT
DRAPE 3/4 80X56 (DRAPES) ×2 IMPLANT
DRAPE SURG 17X11 SM STRL (DRAPES) ×4 IMPLANT
DRAPE U-SHAPE 47X51 STRL (DRAPES) ×4 IMPLANT
DRSG OPSITE POSTOP 3X4 (GAUZE/BANDAGES/DRESSINGS) ×4 IMPLANT
DRSG OPSITE POSTOP 4X6 (GAUZE/BANDAGES/DRESSINGS) ×1 IMPLANT
ELECT REM PT RETURN 9FT ADLT (ELECTROSURGICAL) ×2
ELECTRODE REM PT RTRN 9FT ADLT (ELECTROSURGICAL) ×1 IMPLANT
GAUZE XEROFORM 1X8 LF (GAUZE/BANDAGES/DRESSINGS) ×1 IMPLANT
GLOVE BIOGEL PI IND STRL 8 (GLOVE) ×1 IMPLANT
GLOVE BIOGEL PI INDICATOR 8 (GLOVE) ×1
GLOVE SURG SYN 7.5  E (GLOVE) ×2
GLOVE SURG SYN 7.5 E (GLOVE) ×1 IMPLANT
GLOVE SURG SYN 7.5 PF PI (GLOVE) ×1 IMPLANT
GOWN STRL REUS W/ TWL LRG LVL3 (GOWN DISPOSABLE) ×1 IMPLANT
GOWN STRL REUS W/ TWL XL LVL3 (GOWN DISPOSABLE) ×1 IMPLANT
GOWN STRL REUS W/TWL LRG LVL3 (GOWN DISPOSABLE) ×2
GOWN STRL REUS W/TWL XL LVL3 (GOWN DISPOSABLE) ×2
GUIDE PIN 3.2X330 (PIN) ×4
GUIDEWIRE BALL NOSE 3.0X900 (WIRE) ×1 IMPLANT
KIT PATIENT CARE HANA TABLE (KITS) ×2 IMPLANT
KIT TURNOVER KIT A (KITS) ×2 IMPLANT
MAT ABSORB  FLUID 56X50 GRAY (MISCELLANEOUS) ×2
MAT ABSORB FLUID 56X50 GRAY (MISCELLANEOUS) ×2 IMPLANT
NAIL 11X33X130D LEFT (Nail) ×1 IMPLANT
NDL FILTER BLUNT 18X1 1/2 (NEEDLE) ×1 IMPLANT
NEEDLE FILTER BLUNT 18X 1/2SAF (NEEDLE) ×1
NEEDLE FILTER BLUNT 18X1 1/2 (NEEDLE) ×1 IMPLANT
NEEDLE HYPO 22GX1.5 SAFETY (NEEDLE) ×2 IMPLANT
NS IRRIG 1000ML POUR BTL (IV SOLUTION) ×2 IMPLANT
PACK HIP COMPR (MISCELLANEOUS) ×2 IMPLANT
PENCIL ELECTRO HAND CTR (MISCELLANEOUS) ×2 IMPLANT
PIN GUIDE 3.2X330 (PIN) IMPLANT
SCREW AR 5X75 (Screw) ×1 IMPLANT
SCREW LOCK CORT 5X34 (Screw) ×1 IMPLANT
SCREW LOCK LAG 10.5X95 GALILEO (Screw) ×1 IMPLANT
STAPLER SKIN PROX 35W (STAPLE) ×2 IMPLANT
SUT VIC AB 0 CT1 36 (SUTURE) ×1 IMPLANT
SUT VIC AB 2-0 CT2 27 (SUTURE) ×2 IMPLANT
SYR 10ML LL (SYRINGE) ×2 IMPLANT
SYR 30ML LL (SYRINGE) ×3 IMPLANT
TAPE CLOTH 3X10 WHT NS LF (GAUZE/BANDAGES/DRESSINGS) ×4 IMPLANT
TOOL ACTIVATION (INSTRUMENTS) ×1 IMPLANT
TRAY FOLEY MTR SLVR 16FR STAT (SET/KITS/TRAYS/PACK) ×1 IMPLANT

## 2019-04-11 NOTE — Progress Notes (Signed)
Initial Nutrition Assessment  DOCUMENTATION CODES:   Not applicable  INTERVENTION:  Provide Ensure Max Protein po BID, each supplement provides 150 kcal and 30 grams of protein.  Provide daily MVI.  NUTRITION DIAGNOSIS:   Increased nutrient needs related to post-op healing as evidenced by estimated needs.  GOAL:   Patient will meet greater than or equal to 90% of their needs  MONITOR:   PO intake, Supplement acceptance, Diet advancement, Labs, Weight trends, Skin, I & O's  REASON FOR ASSESSMENT:   Consult Assessment of nutrition requirement/status  ASSESSMENT:   86 year old female with PMHx of HTN, GERD, CAD, hx TIA, CHF, CAD s/p CABG in 2012, DM admitted after a mechanical fall with displaced intertrochanteric fracture of left femur.   Attempted to meet with patient at bedside but she had already been taken down to OR. No family members present in room at time of RD assessment. Patient has been NPO today for surgery.  No weight was taken this admission and the last weight in chart was from 08/27/2018. It appears patient's weight has been slowly trending down over time as she was 789 kg on 11/01/2015 and at last weight on 08/27/2018 she was 69.9 kg.  Medications reviewed and include: famotidine, Novolog 0-15 units TID, lisinopril, pantoprazole.  Labs reviewed: CBG 114-219.  Unable to determine if patient meets criteria for malnutrition at this time.  NUTRITION - FOCUSED PHYSICAL EXAM:  Unable to complete at this time as patient was out of the room.  Diet Order:   Diet Order            Diet NPO time specified  Diet effective now             EDUCATION NEEDS:   No education needs have been identified at this time  Skin:  Skin Assessment: Reviewed RN Assessment  Last BM:  04/06/2019  Height:   Ht Readings from Last 1 Encounters:  08/27/18 5\' 4"  (1.626 m)   Weight:   Wt Readings from Last 1 Encounters:  08/27/18 69.9 kg   BMI:  There is no height or  weight on file to calculate BMI.  Estimated Nutritional Needs:   Kcal:  1500-1700  Protein:  75-85 grams  Fluid:  1.5-1.7 L/day  Jacklynn Barnacle, MS, RD, LDN Pager number available on Amion

## 2019-04-11 NOTE — Op Note (Signed)
DATE OF SURGERY: 04/11/2019  PREOPERATIVE DIAGNOSIS: Left intertrochanteric hip fracture  POSTOPERATIVE DIAGNOSIS: Left intertrochanteric hip fracture  PROCEDURE: Intramedullary nailing of L femur with cephalomedullary device  SURGEON: Cato Mulligan, MD  ANESTHESIA: spinal  EBL: 100 cc  IVF: per anesthesia record  COMPONENTS:  AOS [employer] ES Long Nail: 11x3104mm; 58mm lag screw; 70mm distal cortical interlocking screw  INDICATIONS: Megan Salinas is a 86 y.o. female who sustained an intertrochanteric fracture after a fall. Risks and benefits of intramedullary nailing were explained to the patient and family (who were PoA). Risks include but are not limited to bleeding, infection, injury to tissues, nerves, vessels, nonunion/malunion, hardware failure, limb length discrepancy/hip rotation mismatch and risks of anesthesia. The patient and family understand these risks, have completed an informed consent, and wish to proceed.   PROCEDURE:  The patient was brought into the operating room. After administering anesthesia, the patient was placed in the supine position on the Hana table. The uninjured leg was placed in an extended position while the injured lower extremity was placed in longitudinal traction. The fracture was reduced using longitudinal traction and rotation. The adequacy of reduction was verified fluoroscopically in AP and lateral projections and found to be acceptable. The lateral aspect of the right hip and thigh were prepped with ChloraPrep solution before being draped sterilely. Preoperative IV antibiotics were administered. A timeout was performed to verify the appropriate surgical site, patient, and procedure.    The greater trochanter was identified and an approximately 6 cm incision was made about 3 fingerbreadths above the tip of the greater trochanter. The incision was carried down through the subcutaneous tissues to expose the gluteal fascia. This was split the length of  the incision, providing access to the tip of the trochanter. Under fluoroscopic guidance, a guidewire was drilled through the tip of the trochanter into the proximal metaphysis to the level of the lesser trochanter. After verifying its position fluoroscopically in AP and lateral projections, it was overreamed with the opening reamer to the level of the lesser trochanter. A guidewire was passed down through the femoral canal to the supracondylar region. The adequacy of guidewire position was verified fluoroscopically in AP and lateral projections before the length of the guidewire within the canal was measured and a nail of appropriate length was selected. The guidewire was overreamed sequentially using the flexible reamers. The appropriate sized nail was selected and advanced to the appropriate depth as verified fluoroscopically.    The guide system for the lag screw was positioned and advanced through an approximately 3cm stab incision over the lateral aspect of the proximal femur. The guidewire was drilled up through the femoral nail and into the femoral neck to rest within 5 mm of subchondral bone. After verifying its position in the femoral neck and head in both AP and lateral projections, the guidewire was measured and appropriate sized lag screw was selected. The guidewire was overreamed to the appropriate depth before the lag screw was inserted however, on advancing the screw, the femoral neck and head appeared to rotate slightly.  Therefore was backed out.  The IT stab incision was extended slightly proximally and the drill sleeve for the antirotation suture screw was placed down to bone.  The antirotation screw was drilled and placed appropriately.  I then reattempted to place the lag screw.  This time, the femoral head and neck did not rotate.  The lag screw was then advanced to the appropriate depth as verified fluoroscopically in AP and  lateral projections. The lag screw was advanced and the locking  mechanism was deployed. Again, the adequacy of hardware position and fracture reduction was verified fluoroscopically in AP and lateral projections.   Attention was then turned to the distal interlocking screw in the diaphysis. Using a targeted assembly, a stab incision was made and hole was drilled through the nail. An interlocking screw was placed with excellent purchase. Again the adequacy of screw position was verified fluoroscopically in AP and lateral projections.   The wounds were irrigated thoroughly with sterile saline solution. Local anesthetic was injected into the wounds. Deep fascia was closed with 0-Vicryl. The subcutaneous tissues were closed using 2-0 Vicryl interrupted sutures. The skin was closed using staples. Sterile occlusive dressings were applied to all wounds. The patient was then transferred to the recovery room in satisfactory condition.   POSTOPERATIVE PLAN: The patient will be WBAT on the operative extremity. Lovenox 40mg /day x 4 weeks to start on POD#1. Perioperative IV antibiotics x 24 hours. PT/OT on POD#1.

## 2019-04-11 NOTE — Consult Note (Signed)
CARDIOLOGY CONSULT NOTE               Patient ID: Megan Salinas MRN: [id number] DOB/AGE: [birth year] 86 y.o.  Admit date: 04/10/2019 Referring Physician: Judd Gaudier, MD  Primary Physician: Harrel Lemon, MD  Primary Cardiologist: Serafina Royals, MD  Reason for Consultation: pre-op clearance for hip surgery  HPI: Megan Salinas is an 86 year old Caucasian female with PMH significant for CAD s/p CABG (0000000), chronic systolic heart failure with an EF of 45%-50%, hypertension, hyperlipidemia, DM Type II, TIA, GERD and back pain who suffered a fall in her garage on 04/10/2019.   ED course: Upon ED arrival the patient was found to be hypertensive and the hip x-ray revealed left he placed intro trochanteric fracture.  Head and C-spine CT was unremarkable. CXR reveal no acute changes.  EKG revealed normal sinus rhythm with no acute ST/T wave changes. Blood work was notable for hyperglycemia and slightly elevated WBCs. The patient was admitted to the Orthopedics unit and Orthopedics was consulted which recommended surgery intervention. Due to the patient's extensive cardiac history and at the request of the patient, cardiology was consulted for pre-op clearance.   Upon examination, the patient reports that she has severe left hip pain, but denies any other complaints at this time. The patient denies any chest pain, shortness of breath, palpitations, dizziness or syncope at this time.  The patient reports that her fall was due to a loss of balance only and she denies any near-syncopal episode, dizziness, weakness or LOC during that time. The patient's daughter [facility]) was at the bedside and states that the patient has been having issues with her balance as of late. Patient was recently seen in office by her primary cardiology in februrary, per note it appears that her CHF and CAD were well managed at that time and no medication changes were made.   Review of systems complete and found to be  negative unless listed above   Past Medical History:  Diagnosis Date  . Back pain   . CAD (coronary artery disease)   . CHF (congestive heart failure) ([place])   . GERD (gastroesophageal reflux disease)   . High cholesterol   . Hypertension   . TIA (transient ischemic attack) 01/2016   "possible", no deficits  . Wears hearing aid    bilateral    Past Surgical History:  Procedure Laterality Date  . ABDOMINAL HYSTERECTOMY  1998  . APPENDECTOMY    . baldder tac  1995  . BREAST CYST ASPIRATION Right   . CARPAL TUNNEL RELEASE Right 03/26/2016   Procedure: CARPAL TUNNEL RELEASE ENDOSCOPIC  right;  Surgeon: Corky Mull, MD;  Location: [place];  Service: Orthopedics;  Laterality: Right;  bier block  . CHOLECYSTECTOMY    . CORONARY ARTERY BYPASS GRAFT  02/15/2010   [facility], 3 vessel  . TONSILLECTOMY      Medications Prior to Admission  Medication Sig Dispense Refill Last Dose  . acetaminophen (TYLENOL) 500 MG tablet Take 500 mg by mouth every 6 (six) hours as needed for mild pain.    PRN at PRN  . amitriptyline (ELAVIL) 25 MG tablet Take 25 mg by mouth at bedtime.    04/09/2019 at Unknown time  . aspirin EC 81 MG tablet Take 81 mg by mouth daily.    04/10/2019 at Unknown time  . betamethasone dipropionate (DIPROLENE) 0.05 % ointment APPLY TO AFFECTED AREAS ON VULVA TWO TIMES WEEKLY   Unknown at Unknown  .  cetirizine (ZYRTEC) 10 MG tablet Take 10 mg by mouth daily.   04/10/2019 at Unknown time  . famotidine (PEPCID) 20 MG tablet Take 20 mg by mouth 2 (two) times daily.    04/10/2019 at Unknown  . ibuprofen (ADVIL,MOTRIN) 200 MG tablet Take 200 mg by mouth every 6 (six) hours as needed for moderate pain.    PRN at PRN  . lisinopril (PRINIVIL,ZESTRIL) 20 MG tablet Take 20 mg by mouth daily.   11 04/10/2019 at Unknown time  . metoprolol tartrate (LOPRESSOR) 25 MG tablet TAKE 1/2 TABLET BY MOUTH TWICE DAILY   04/10/2019 at Unknown time  . Multiple Vitamin (MULTI-VITAMINS) TABS Take 1 tablet  by mouth daily.    04/10/2019 at Unknown time  . pantoprazole (PROTONIX) 40 MG tablet Take 1 tablet (40 mg total) by mouth 2 (two) times daily. 60 tablet 11 04/10/2019 at Unknown time  . simvastatin (ZOCOR) 40 MG tablet TAKE 1 TABLET BY MOUTH EVERY DAY   04/10/2019 at Unknown time  . spironolactone (ALDACTONE) 25 MG tablet TAKE 1/2 TABLET BY MOUTH EVERY DAY   04/10/2019 at Unknown time  . clindamycin (CLEOCIN) 300 MG capsule Take 1 capsule (300 mg total) by mouth 4 (four) times daily. (Patient not taking: Reported on 04/10/2019) 28 capsule 0 Completed Course at Unknown time  . HYDROcodone-acetaminophen (NORCO/VICODIN) 5-325 MG tablet Take 1 tablet by mouth every 4 (four) hours as needed for moderate pain. (Patient not taking: Reported on 04/10/2019) 12 tablet 0 Not Taking at Unknown time  . nystatin cream (MYCOSTATIN) Apply one application to affected area as needed.   Not Taking at Unknown time  . polyethylene glycol (MIRALAX / GLYCOLAX) packet Take 17 g by mouth daily.    Not Taking at Unknown time  . ranitidine (ZANTAC) 150 MG tablet TAKE 1 TABLET BY MOUTH NIGHTLY   Not Taking at Unknown time  . traMADol (ULTRAM) 50 MG tablet Take 1 tablet (50 mg total) by mouth every 6 (six) hours as needed. (Patient not taking: Reported on 04/10/2019) 20 tablet 0 Not Taking at Unknown time   Social History   Socioeconomic History  . Marital status: Widowed    Spouse name: Not on file  . Number of children: Not on file  . Years of education: Not on file  . Highest education level: Not on file  Occupational History  . Not on file  Tobacco Use  . Smoking status: Former Research scientist (life sciences)  . Smokeless tobacco: Never Used  . Tobacco comment: quit over 50 yrs ago  Substance and Sexual Activity  . Alcohol use: Yes    Alcohol/week: 5.0 standard drinks    Types: 5 Glasses of wine per week    Comment:    . Drug use: No  . Sexual activity: Not Currently  Other Topics Concern  . Not on file  Social History Narrative  . Not  on file   Social Determinants of Health   Financial Resource Strain:   . Difficulty of Paying Living Expenses:   Food Insecurity:   . Worried About Charity fundraiser in the Last Year:   . Arboriculturist in the Last Year:   Transportation Needs:   . Film/video editor (Medical):   Marland Kitchen Lack of Transportation (Non-Medical):   Physical Activity:   . Days of Exercise per Week:   . Minutes of Exercise per Session:   Stress:   . Feeling of Stress :   Social Connections:   .  Frequency of Communication with Friends and Family:   . Frequency of Social Gatherings with Friends and Family:   . Attends Religious Services:   . Active Member of Clubs or Organizations:   . Attends Archivist Meetings:   Marland Kitchen Marital Status:   Intimate Partner Violence:   . Fear of Current or Ex-Partner:   . Emotionally Abused:   Marland Kitchen Physically Abused:   . Sexually Abused:     Family History  Problem Relation Age of Onset  . Breast cancer Paternal Aunt        15's  . Hypertension Mother       Review of systems complete and found to be negative unless listed above      PHYSICAL EXAM  General: Well developed, well nourished, in no acute distress HEENT:  Normocephalic and atraumatic, sy,mmetrical, hard of hearing  Neck:  Mild JVD noted   Lungs: Clear bilaterally to auscultation  Heart: HRRR . Normal S1 and S2 without gallops. Soft murmur noted   Abdomen: Bowel sounds are positive, abdomen soft and non-tender  Msk:  Normal strength in BUE for age. Diminished strength in LLE. Normal strength for age in RLE.  Extremities: mild, non-pitting edema. No clubbing or cyanosis.   Neuro: Alert and oriented X 3. Psych:  Good affect, responds appropriately  Labs:   Lab Results  Component Value Date   WBC 11.0 (H) 04/10/2019   HGB 12.4 04/10/2019   HCT 37.1 04/10/2019   MCV 93.5 04/10/2019   PLT 231 04/10/2019    Recent Labs  Lab 04/10/19 2245  NA 137  K 3.8  CL 106  CO2 22  BUN 13    CREATININE 0.66  CALCIUM 9.6  GLUCOSE 177*   Lab Results  Component Value Date   CKTOTAL 100 03/23/2013   CKMB 2.6 03/23/2013   TROPONINI <0.03 03/01/2017    Lab Results  Component Value Date   CHOL 160 01/29/2016   Lab Results  Component Value Date   HDL 61 01/29/2016   Lab Results  Component Value Date   LDLCALC 67 01/29/2016   Lab Results  Component Value Date   TRIG 158 (H) 01/29/2016   Lab Results  Component Value Date   CHOLHDL 2.6 01/29/2016   No results found for: LDLDIRECT    Radiology: CT Head Wo Contrast  Result Date: 04/10/2019 CLINICAL DATA:  Recent fall with headaches and neck pain, initial encounter EXAM: CT HEAD WITHOUT CONTRAST CT CERVICAL SPINE WITHOUT CONTRAST TECHNIQUE: Multidetector CT imaging of the head and cervical spine was performed following the standard protocol without intravenous contrast. Multiplanar CT image reconstructions of the cervical spine were also generated. COMPARISON:  01/28/2016, 01/14/2018 FINDINGS: CT HEAD FINDINGS Brain: No evidence of acute infarction, hemorrhage, hydrocephalus, extra-axial collection or mass lesion/mass effect. Chronic atrophic and ischemic changes are again noted and stable. Vascular: No hyperdense vessel or unexpected calcification. Skull: Normal. Negative for fracture or focal lesion. Sinuses/Orbits: Mucosal retention cyst is noted in the left maxillary antrum stable from the prior exams. The paranasal sinuses are otherwise within normal limits. Mastoid air cells on the left demonstrate opacification. This is stable from the prior exam of 2020. Other: None. CT CERVICAL SPINE FINDINGS Alignment: Within normal limits. Skull base and vertebrae: 7 cervical segments are well visualized. Vertebral body height is well maintained. Multilevel facet hypertrophic changes and osteophytic changes are seen. No acute fracture or acute facet abnormality is noted. Neural foraminal narrowing is noted bilaterally. Soft tissues and  spinal canal: Scattered calcifications and hypodense nodular changes are noted throughout the thyroid. None of these are significant by size criteria. The remaining soft tissues of the neck are within normal limits. Upper chest: Visualized lung apices are within normal limits. Other: None IMPRESSION: CT of the head: Chronic atrophic and ischemic changes without acute intracranial abnormality. Chronic left mastoid opacification CT of the cervical spine: Multilevel degenerative changes without acute abnormality. Multiple small hypodensities and calcifications noted throughout the thyroid. No follow-up recommended unless clinically warranted (ref: J Am Coll Radiol. 2015 Feb;12(2): 143-50). Electronically Signed   By: Inez Catalina M.D.   On: 04/10/2019 21:35   CT Cervical Spine Wo Contrast  Result Date: 04/10/2019 CLINICAL DATA:  Recent fall with headaches and neck pain, initial encounter EXAM: CT HEAD WITHOUT CONTRAST CT CERVICAL SPINE WITHOUT CONTRAST TECHNIQUE: Multidetector CT imaging of the head and cervical spine was performed following the standard protocol without intravenous contrast. Multiplanar CT image reconstructions of the cervical spine were also generated. COMPARISON:  01/28/2016, 01/14/2018 FINDINGS: CT HEAD FINDINGS Brain: No evidence of acute infarction, hemorrhage, hydrocephalus, extra-axial collection or mass lesion/mass effect. Chronic atrophic and ischemic changes are again noted and stable. Vascular: No hyperdense vessel or unexpected calcification. Skull: Normal. Negative for fracture or focal lesion. Sinuses/Orbits: Mucosal retention cyst is noted in the left maxillary antrum stable from the prior exams. The paranasal sinuses are otherwise within normal limits. Mastoid air cells on the left demonstrate opacification. This is stable from the prior exam of 2020. Other: None. CT CERVICAL SPINE FINDINGS Alignment: Within normal limits. Skull base and vertebrae: 7 cervical segments are well  visualized. Vertebral body height is well maintained. Multilevel facet hypertrophic changes and osteophytic changes are seen. No acute fracture or acute facet abnormality is noted. Neural foraminal narrowing is noted bilaterally. Soft tissues and spinal canal: Scattered calcifications and hypodense nodular changes are noted throughout the thyroid. None of these are significant by size criteria. The remaining soft tissues of the neck are within normal limits. Upper chest: Visualized lung apices are within normal limits. Other: None IMPRESSION: CT of the head: Chronic atrophic and ischemic changes without acute intracranial abnormality. Chronic left mastoid opacification CT of the cervical spine: Multilevel degenerative changes without acute abnormality. Multiple small hypodensities and calcifications noted throughout the thyroid. No follow-up recommended unless clinically warranted (ref: J Am Coll Radiol. 2015 Feb;12(2): 143-50). Electronically Signed   By: Inez Catalina M.D.   On: 04/10/2019 21:35   DG Chest Port 1 View  Result Date: 04/10/2019 CLINICAL DATA:  Preop EXAM: PORTABLE CHEST 1 VIEW COMPARISON:  02/28/2017 FINDINGS: Increased interstitial markings. This appearance favors chronic interstitial lung disease over interstitial edema or mild pneumonia. Mild right lower lung opacity favors atelectasis. No pleural effusion or pneumothorax. Cardiomegaly. Postsurgical changes related to prior CABG. Thoracic aortic atherosclerosis. Degenerative changes of the bilateral shoulders. IMPRESSION: Increased interstitial markings, favoring chronic interstitial lung disease, with mild right lower lobe atelectasis. Electronically Signed   By: Julian Hy M.D.   On: 04/10/2019 22:28   DG HIP UNILAT WITH PELVIS 2-3 VIEWS LEFT  Result Date: 04/10/2019 CLINICAL DATA:  Pain EXAM: DG HIP (WITH OR WITHOUT PELVIS) 2-3V LEFT COMPARISON:  None. FINDINGS: There is an acute displaced intratrochanteric fracture of the  proximal left femur. Multiple displaced fracture fragments are noted. There is diffuse osteopenia. There are degenerative changes of both hips. There is no dislocation. Vascular calcifications are noted. IMPRESSION: Acute displaced intratrochanteric fracture of the proximal left femur. Electronically  Signed   By: Constance Holster M.D.   On: 04/10/2019 21:53    EKG: SR with HR of 7bpm  ASSESSMENT AND PLAN:  1. Chronic systolic heart failure, most recent EF+45-50%, reasonably stable  -Recommend the continuation of at-home dosages of Metoprolol and spironolactone   - Agree with Echocardiogram   2. CAD (3 vessel) s/p CABG (2012), reasonably stable, the patient denies any chest pain or anginal-like symptoms at this time and reports to be compliant with all of her medications  -Recommend continuing statin and ASA post-surgery  -Recommend continuous telemetry monitoring  3. HTN, fairly controlled, pt is slightly hypertensive now but has not had any of her home medications  -Recommend resuming at home dosage of Lisinopril   4. DM Type II, failry controlled, pt is hyperglycemic today  -Agree with current management with sliding scale  5. HLD, reasonably controlled  -Recommend resuming at home dosage of simvastatin  6. Displaced intertochanteric fracture of the left femur due to accidental fall  - Managed by Orthopedic Surgery  7. Pre-operative clearance  -The patient is deemed a moderate risk for surgery and is cleared from a cardiac standpoint for orthopedic surgical intervention.   -Recommend resuming DVT prophylaxis post surgery as ordered by Orthopedics.    -Instructed the RN to give the patient the 12.5mg  metoprolol dose prior to surgery this morning.      Discussed with Dr. Clayborn Bigness who also evaluated the patient and the plan was made in collaboration with him.     Signed: Doye Montilla ACNPC-AG  04/11/2019, 8:59 AM

## 2019-04-11 NOTE — Anesthesia Preprocedure Evaluation (Signed)
Anesthesia Evaluation  Patient identified by MRN, date of birth, ID band Patient awake    Reviewed: Allergy & Precautions, NPO status , Patient's Chart, lab work & pertinent test results  History of Anesthesia Complications Negative for: history of anesthetic complications  Airway Mallampati: III  TM Distance: >3 FB Neck ROM: Full    Dental no notable dental hx. (+) Poor Dentition, Dental Advisory Given Patient getting multiple teeth pulled soon due to poor dentition. Denies any teeth currently being loose.:   Pulmonary neg pulmonary ROS, neg sleep apnea, neg COPD, Patient abstained from smoking.Not current smoker, former smoker,    Pulmonary exam normal breath sounds clear to auscultation       Cardiovascular Exercise Tolerance: Good METShypertension, Pt. on medications + CAD, + CABG and +CHF  (-) Past MI (-) dysrhythmias  Rhythm:Regular Rate:Normal - Systolic murmurs    Neuro/Psych TIAnegative psych ROS   GI/Hepatic neg GERD  ,(+)     (-) substance abuse  ,   Endo/Other  neg diabetes  Renal/GU negative Renal ROS     Musculoskeletal   Abdominal   Peds  Hematology   Anesthesia Other Findings Past Medical History: No date: Back pain No date: CAD (coronary artery disease) No date: CHF (congestive heart failure) ([facility]) No date: GERD (gastroesophageal reflux disease) No date: High cholesterol No date: Hypertension 01/2016: TIA (transient ischemic attack)     Comment:  "possible", no deficits No date: Wears hearing aid     Comment:  bilateral  Reproductive/Obstetrics                             Anesthesia Physical Anesthesia Plan  ASA: III  Anesthesia Plan: General/Spinal   Post-op Pain Management:    Induction: Intravenous  PONV Risk Score and Plan: 4 or greater and Ondansetron, Dexamethasone and TIVA  Airway Management Planned: Natural Airway and Oral ETT  Additional  Equipment:   Intra-op Plan:   Post-operative Plan:   Informed Consent: I have reviewed the patients History and Physical, chart, labs and discussed the procedure including the risks, benefits and alternatives for the proposed anesthesia with the patient or authorized representative who has indicated his/her understanding and acceptance.       Plan Discussed with: CRNA and Surgeon  Anesthesia Plan Comments: (Discussed R/B/A of neuraxial anesthesia technique with patient: - rare risks of spinal/epidural hematoma, nerve damage, infection - Risk of PDPH - Risk of nausea and vomiting - Risk of conversion to general anesthesia and its associated risks, including sore throat, damage to lips/teeth/oropharynx, and rare risks such as cardiac and respiratory events.  Patient and daughter/HCP voiced understanding.)        Anesthesia Quick Evaluation

## 2019-04-11 NOTE — H&P (Signed)
H&P reviewed. No significant changes noted.  

## 2019-04-11 NOTE — Progress Notes (Signed)
*  PRELIMINARY RESULTS* Echocardiogram 2D Echocardiogram has been performed.  Megan Salinas 04/11/2019, 10:56 AM

## 2019-04-11 NOTE — Anesthesia Procedure Notes (Signed)
Date/Time: 04/11/2019 1:45 PM Performed by: Allean Found, CRNA Pre-anesthesia Checklist: Suction available, Patient identified, Emergency Drugs available, Patient being monitored and Timeout performed Oxygen Delivery Method: Nasal cannula Placement Confirmation: positive ETCO2

## 2019-04-11 NOTE — Progress Notes (Signed)
Ch visited with Pt in response to OR for AD, and prayer. Upon arrival, Pt was in bed and daughter Lattie Haw was bedside. Daughter let Ch know that Pt cannot hear, and she does not also have her hearing-aids in. Ch moved closer to Pt to introduce self. Pt's daughter reported that Pt has been nervous about upcoming surgery. Pt already has [place], Living will signed and notarized. Pt said "Death is a terrible thing, I hope medicine will advance enough to save [place] from it" Pt seems afraid of dying, and is nervous about hip surgery which is to happen in an hour. Ch prayed with Pt and daughter for peace, strength and comfort during this time. Ch let Pt and daughter know about chaplain availability and left.

## 2019-04-11 NOTE — Transfer of Care (Signed)
Immediate Anesthesia Transfer of Care Note  Patient: Megan Salinas  Procedure(s) Performed: INTRAMEDULLARY (IM) NAIL INTERTROCHANTRIC (Left Hip)  Patient Location: PACU  Anesthesia Type:Spinal  Level of Consciousness: awake and alert   Airway & Oxygen Therapy: Patient Spontanous Breathing and Patient connected to nasal cannula oxygen  Post-op Assessment: Report given to RN and Post -op Vital signs reviewed and stable  Post vital signs: Reviewed and stable  Last Vitals:  Vitals Value Taken Time  BP 115/52 04/11/19 1528  Temp    Pulse 40 04/11/19 1530  Resp 14 04/11/19 1531  SpO2 94 % 04/11/19 1530  Vitals shown include unvalidated device data.  Last Pain:  Vitals:   04/11/19 1221  TempSrc: Temporal  PainSc: 4       Patients Stated Pain Goal: 0 ([id number])  Complications: No apparent anesthesia complications

## 2019-04-11 NOTE — Consult Note (Signed)
ORTHOPAEDIC CONSULTATION  REQUESTING PHYSICIAN: Dhungel, Flonnie Overman, MD  Chief Complaint:   L hip pain  History of Present Illness: Megan Salinas is a 86 y.o. female who had a fall yesterday while attempting to get out of her car. She had gone for a short drive.  The patient noted immediate hip pain and inability to ambulate.  The patient ambulates with a walker at baseline. She lives independently at [facility].  Pain is described as sharp at its worst and a dull ache at its best.  Pain is rated a 10 out of 10 in severity.  Pain is improved with rest and immobilization.  Pain is worse with any sort of movement.  X-rays in the emergency department show a left intertrochanteric hip fracture.  Of note, she has had a prior R femoral shaft fracture ~15-20 years ago. She has also had a CABG in 2012. She also has CHF.   Past Medical History:  Diagnosis Date  . Back pain   . CAD (coronary artery disease)   . CHF (congestive heart failure) ([place])   . GERD (gastroesophageal reflux disease)   . High cholesterol   . Hypertension   . TIA (transient ischemic attack) 01/2016   "possible", no deficits  . Wears hearing aid    bilateral   Past Surgical History:  Procedure Laterality Date  . ABDOMINAL HYSTERECTOMY  1998  . APPENDECTOMY    . baldder tac  1995  . BREAST CYST ASPIRATION Right   . CARPAL TUNNEL RELEASE Right 03/26/2016   Procedure: CARPAL TUNNEL RELEASE ENDOSCOPIC  right;  Surgeon: Corky Mull, MD;  Location: [place] [facility];  Service: Orthopedics;  Laterality: Right;  bier block  . CHOLECYSTECTOMY    . CORONARY ARTERY BYPASS GRAFT  02/15/2010   [facility], 3 vessel  . TONSILLECTOMY     Social History   Socioeconomic History  . Marital status: Widowed    Spouse name: Not on file  . Number of children: Not on file  . Years of education: Not on file  . Highest education level: Not on file  Occupational History  . Not  on file  Tobacco Use  . Smoking status: Former Research scientist (life sciences)  . Smokeless tobacco: Never Used  . Tobacco comment: quit over 50 yrs ago  Substance and Sexual Activity  . Alcohol use: Yes    Alcohol/week: 5.0 standard drinks    Types: 5 Glasses of wine per week    Comment:    . Drug use: No  . Sexual activity: Not Currently  Other Topics Concern  . Not on file  Social History Narrative  . Not on file   Social Determinants of Health   Financial Resource Strain:   . Difficulty of Paying Living Expenses:   Food Insecurity:   . Worried About Charity fundraiser in the Last Year:   . Arboriculturist in the Last Year:   Transportation Needs:   . Film/video editor (Medical):   Marland Kitchen Lack of Transportation (Non-Medical):   Physical Activity:   . Days of Exercise per Week:   . Minutes of Exercise per Session:   Stress:   . Feeling of Stress :   Social Connections:   . Frequency of Communication with Friends and Family:   . Frequency of Social Gatherings with Friends and Family:   . Attends Religious Services:   . Active Member of Clubs or Organizations:   . Attends Archivist Meetings:   .  Marital Status:    Family History  Problem Relation Age of Onset  . Breast cancer Paternal Aunt        38's  . Hypertension Mother    Allergies  Allergen Reactions  . Sulfa Antibiotics Itching and Rash    Red, itchy palms   Prior to Admission medications   Medication Sig Start Date End Date Taking? Authorizing Provider  acetaminophen (TYLENOL) 500 MG tablet Take 500 mg by mouth every 6 (six) hours as needed for mild pain.    Yes [provider]  amitriptyline (ELAVIL) 25 MG tablet Take 25 mg by mouth at bedtime.  09/12/15  Yes [provider]  aspirin EC 81 MG tablet Take 81 mg by mouth daily.    Yes [provider]  betamethasone dipropionate (DIPROLENE) 0.05 % ointment APPLY TO AFFECTED AREAS ON VULVA TWO TIMES WEEKLY 03/30/19  Yes [provider]   cetirizine (ZYRTEC) 10 MG tablet Take 10 mg by mouth daily. 03/29/19  Yes [provider]  famotidine (PEPCID) 20 MG tablet Take 20 mg by mouth 2 (two) times daily.    Yes [provider]  ibuprofen (ADVIL,MOTRIN) 200 MG tablet Take 200 mg by mouth every 6 (six) hours as needed for moderate pain.    Yes [provider]  lisinopril (PRINIVIL,ZESTRIL) 20 MG tablet Take 20 mg by mouth daily.  05/19/16  Yes [provider]  metoprolol tartrate (LOPRESSOR) 25 MG tablet TAKE 1/2 TABLET BY MOUTH TWICE DAILY 06/26/15  Yes [provider]  Multiple Vitamin (MULTI-VITAMINS) TABS Take 1 tablet by mouth daily.    Yes [provider]  pantoprazole (PROTONIX) 40 MG tablet Take 1 tablet (40 mg total) by mouth 2 (two) times daily. 11/01/15  Yes Lucilla Lame, MD  simvastatin (ZOCOR) 40 MG tablet TAKE 1 TABLET BY MOUTH EVERY DAY 05/28/15  Yes [provider]  spironolactone (ALDACTONE) 25 MG tablet TAKE 1/2 TABLET BY MOUTH EVERY DAY 09/06/15  Yes [provider]  clindamycin (CLEOCIN) 300 MG capsule Take 1 capsule (300 mg total) by mouth 4 (four) times daily. Patient not taking: Reported on 04/10/2019 06/01/18   Cuthriell, Charline Bills, PA-C  HYDROcodone-acetaminophen (NORCO/VICODIN) 5-325 MG tablet Take 1 tablet by mouth every 4 (four) hours as needed for moderate pain. Patient not taking: Reported on 04/10/2019 06/01/18   Cuthriell, Charline Bills, PA-C  nystatin cream (MYCOSTATIN) Apply one application to affected area as needed. 09/10/15   [provider]  polyethylene glycol (MIRALAX / GLYCOLAX) packet Take 17 g by mouth daily.     [provider]  ranitidine (ZANTAC) 150 MG tablet TAKE 1 TABLET BY MOUTH NIGHTLY 10/29/15   [provider]  traMADol (ULTRAM) 50 MG tablet Take 1 tablet (50 mg total) by mouth every 6 (six) hours as needed. Patient not taking: Reported on 04/10/2019 03/26/16   Corky Mull, MD   Recent Labs     04/10/19 2245  WBC 11.0*  HGB 12.4  HCT 37.1  PLT 231  K 3.8  CL 106  CO2 22  BUN 13  CREATININE 0.66  GLUCOSE 177*  CALCIUM 9.6  INR 1.1   CT Head Wo Contrast  Result Date: 04/10/2019 CLINICAL DATA:  Recent fall with headaches and neck pain, initial encounter EXAM: CT HEAD WITHOUT CONTRAST CT CERVICAL SPINE WITHOUT CONTRAST TECHNIQUE: Multidetector CT imaging of the head and cervical spine was performed following the standard protocol without intravenous contrast. Multiplanar CT image reconstructions of the cervical  spine were also generated. COMPARISON:  01/28/2016, 01/14/2018 FINDINGS: CT HEAD FINDINGS Brain: No evidence of acute infarction, hemorrhage, hydrocephalus, extra-axial collection or mass lesion/mass effect. Chronic atrophic and ischemic changes are again noted and stable. Vascular: No hyperdense vessel or unexpected calcification. Skull: Normal. Negative for fracture or focal lesion. Sinuses/Orbits: Mucosal retention cyst is noted in the left maxillary antrum stable from the prior exams. The paranasal sinuses are otherwise within normal limits. Mastoid air cells on the left demonstrate opacification. This is stable from the prior exam of 2020. Other: None. CT CERVICAL SPINE FINDINGS Alignment: Within normal limits. Skull base and vertebrae: 7 cervical segments are well visualized. Vertebral body height is well maintained. Multilevel facet hypertrophic changes and osteophytic changes are seen. No acute fracture or acute facet abnormality is noted. Neural foraminal narrowing is noted bilaterally. Soft tissues and spinal canal: Scattered calcifications and hypodense nodular changes are noted throughout the thyroid. None of these are significant by size criteria. The remaining soft tissues of the neck are within normal limits. Upper chest: Visualized lung apices are within normal limits. Other: None IMPRESSION: CT of the head: Chronic atrophic and ischemic changes without acute  intracranial abnormality. Chronic left mastoid opacification CT of the cervical spine: Multilevel degenerative changes without acute abnormality. Multiple small hypodensities and calcifications noted throughout the thyroid. No follow-up recommended unless clinically warranted (ref: J Am Coll Radiol. 2015 Feb;12(2): 143-50). Electronically Signed   By: Inez Catalina M.D.   On: 04/10/2019 21:35   CT Cervical Spine Wo Contrast  Result Date: 04/10/2019 CLINICAL DATA:  Recent fall with headaches and neck pain, initial encounter EXAM: CT HEAD WITHOUT CONTRAST CT CERVICAL SPINE WITHOUT CONTRAST TECHNIQUE: Multidetector CT imaging of the head and cervical spine was performed following the standard protocol without intravenous contrast. Multiplanar CT image reconstructions of the cervical spine were also generated. COMPARISON:  01/28/2016, 01/14/2018 FINDINGS: CT HEAD FINDINGS Brain: No evidence of acute infarction, hemorrhage, hydrocephalus, extra-axial collection or mass lesion/mass effect. Chronic atrophic and ischemic changes are again noted and stable. Vascular: No hyperdense vessel or unexpected calcification. Skull: Normal. Negative for fracture or focal lesion. Sinuses/Orbits: Mucosal retention cyst is noted in the left maxillary antrum stable from the prior exams. The paranasal sinuses are otherwise within normal limits. Mastoid air cells on the left demonstrate opacification. This is stable from the prior exam of 2020. Other: None. CT CERVICAL SPINE FINDINGS Alignment: Within normal limits. Skull base and vertebrae: 7 cervical segments are well visualized. Vertebral body height is well maintained. Multilevel facet hypertrophic changes and osteophytic changes are seen. No acute fracture or acute facet abnormality is noted. Neural foraminal narrowing is noted bilaterally. Soft tissues and spinal canal: Scattered calcifications and hypodense nodular changes are noted throughout the thyroid. None of these are  significant by size criteria. The remaining soft tissues of the neck are within normal limits. Upper chest: Visualized lung apices are within normal limits. Other: None IMPRESSION: CT of the head: Chronic atrophic and ischemic changes without acute intracranial abnormality. Chronic left mastoid opacification CT of the cervical spine: Multilevel degenerative changes without acute abnormality. Multiple small hypodensities and calcifications noted throughout the thyroid. No follow-up recommended unless clinically warranted (ref: J Am Coll Radiol. 2015 Feb;12(2): 143-50). Electronically Signed   By: Inez Catalina M.D.   On: 04/10/2019 21:35   DG Chest Port 1 View  Result Date: 04/10/2019 CLINICAL DATA:  Preop EXAM: PORTABLE CHEST 1 VIEW COMPARISON:  02/28/2017 FINDINGS: Increased interstitial markings. This appearance favors chronic  interstitial lung disease over interstitial edema or mild pneumonia. Mild right lower lung opacity favors atelectasis. No pleural effusion or pneumothorax. Cardiomegaly. Postsurgical changes related to prior CABG. Thoracic aortic atherosclerosis. Degenerative changes of the bilateral shoulders. IMPRESSION: Increased interstitial markings, favoring chronic interstitial lung disease, with mild right lower lobe atelectasis. Electronically Signed   By: Julian Hy M.D.   On: 04/10/2019 22:28   DG HIP UNILAT WITH PELVIS 2-3 VIEWS LEFT  Result Date: 04/10/2019 CLINICAL DATA:  Pain EXAM: DG HIP (WITH OR WITHOUT PELVIS) 2-3V LEFT COMPARISON:  None. FINDINGS: There is an acute displaced intratrochanteric fracture of the proximal left femur. Multiple displaced fracture fragments are noted. There is diffuse osteopenia. There are degenerative changes of both hips. There is no dislocation. Vascular calcifications are noted. IMPRESSION: Acute displaced intratrochanteric fracture of the proximal left femur. Electronically Signed   By: Constance Holster M.D.   On: 04/10/2019 21:53      Positive ROS: All other systems have been reviewed and were otherwise negative with the exception of those mentioned in the HPI and as above.  Physical Exam: BP (!) 154/55 (BP Location: Right Arm)   Pulse 91   Temp 98.4 F (36.9 C) (Oral)   Resp 16   SpO2 97%  General:  Alert, no acute distress Psychiatric:  Patient is competent for consent with normal mood and affect   Cardiovascular:  No pedal edema, regular rate and rhythm Respiratory:  No wheezing, non-labored breathing GI:  Abdomen is soft and non-tender Skin:  No lesions in the area of chief complaint, no erythema Neurologic:  Sensation intact distally, CN grossly intact Lymphatic:  No axillary or cervical lymphadenopathy  Orthopedic Exam:  LLE: + DF/PF/EHL SILT grossly over foot Foot wwp +Log roll/axial load   X-rays:  As above: L intertrochanteric hip fracture  Assessment/Plan: Megan Salinas is a 86 y.o. female with a L intertrochanteric hip fracture   1. I discussed the various treatment options including both surgical and non-surgical management of her fracture with the patient and her daughters (medical PoA). We discussed the high risk of perioperative complications due to patient's age and other co-morbidities. After discussion of risks, benefits, and alternatives to surgery, the family and patient were in agreement to proceed with surgery. The goals of surgery would be to provide adequate pain relief and allow for mobilization. Plan for surgery is L hip cephalomedullary nailing today, 04/11/2019. 2. NPO until OR 3. Hold anticoagulation in advance of OR         Leim Fabry   04/11/2019 7:53 AM

## 2019-04-11 NOTE — ED Notes (Signed)
Family at bedside calling out for pain meds for pt

## 2019-04-11 NOTE — Anesthesia Procedure Notes (Signed)
Spinal  Start time: 04/11/2019 1:30 PM End time: 04/11/2019 1:35 PM Staffing Performed: anesthesiologist and resident/CRNA  Anesthesiologist: Arita Miss, MD Resident/CRNA: Jerrye Noble, CRNA Preanesthetic Checklist Completed: patient identified, IV checked, site marked, risks and benefits discussed, surgical consent, monitors and equipment checked, pre-op evaluation and timeout performed Spinal Block Patient position: right lateral decubitus Prep: ChloraPrep Patient monitoring: continuous pulse ox and blood pressure Approach: midline Location: L3-4 Injection technique: single-shot Needle Needle type: Quincke  Needle gauge: 22 G Additional Notes Negative paresthesia, Negative heme.

## 2019-04-11 NOTE — ED Notes (Signed)
Pt reports moving her car in driveway and slipped on damp driveway, pt c/o left hip pain nonradiating, pt left hip appears outward flexed and left knee bent, skin is warm to touch  Pt reports triple bypass hx

## 2019-04-11 NOTE — Progress Notes (Signed)
PROGRESS NOTE                                                                                                                                                                                                             Patient Demographics:    Megan Salinas, is a 86 y.o. female, DOB - Jun 06, 1926, [id number]  Admit date - 04/10/2019   Admitting Physician [name], MD  Outpatient Primary MD for the patient is [name], MD  LOS - 1  Outpatient Specialists: Dr. Nehemiah Massed  Chief Complaint  Patient presents with   Fall       Brief Narrative 86 year old female with chronic systolic CHF (last EF of [id number]), CAD s/p CABG in 2012, hypertension and type 2 diabetes mellitus presented with mechanical fall at home.  Uses a walker to ambulate at baseline.  No loss of consciousness.  She landed on her left hip and in the ED was found to have displaced left intertrochanteric fracture.  No other injuries sustained. Admitted for further management.    Subjective:   Reports left hip spasms.   Assessment  & Plan :    Principal Problem:   Displaced intertrochanteric fracture of left femur ([place]) Secondary to mechanical fall.  Plan on surgical intervention this morning.  Further recommendations per orthopedics.  Hip fracture pathway initiated.  Pain control with as needed morphine.  PT eval postop.  Active Problems:   Chronic systolic CHF (congestive heart failure), NYHA class 3 (HCC)  Coronary artery disease with CABG in 2012 Appears euvolemic.  Last EF of 45-50%.  Continue metoprolol, Aldactone and statin. Cardiology consult appreciated for preoperative clearance.  Has moderate risk for surgery. 2D echo ordered on admission.     Type 2 diabetes mellitus without complication (HCC) Stable.  Recent A1c of 6.8.  Monitor on sliding scale coverage.       Code Status : Full code  Family Communication  : Daughter  at bedside  Disposition Plan  : Pending postop course, will need SNF  Barriers For Discharge : Acute fracture, pending surgery  Consults  : Orthopedics (Dr. Posey Pronto), cardiology  Procedures  :  pending elective surgery  DVT Prophylaxis  :  Lovenox -  Lab Results  Component Value Date   PLT 231 04/10/2019    Antibiotics  :  Anti-infectives (From admission, onward)   Start     Dose/Rate Route Frequency Ordered Stop   04/10/19 2230  ceFAZolin (ANCEF) IVPB 1 g/50 mL premix     1 g 100 mL/hr over 30 Minutes Intravenous  Once 04/10/19 2220          Objective:   Vitals:   04/11/19 0100 04/11/19 0200 04/11/19 0300 04/11/19 0740  BP: (!) 142/62 (!) 134/59 (!) 157/69 (!) 154/55  Pulse: 84 88 96 91  Resp: 11 19 16    Temp:   98.3 F (36.8 C) 98.4 F (36.9 C)  TempSrc:   Oral Oral  SpO2: 99% 97% 96% 97%    Wt Readings from Last 3 Encounters:  08/27/18 69.9 kg  08/24/18 70.8 kg  06/01/18 74.8 kg    No intake or output data in the 24 hours ending 04/11/19 1155   Physical Exam  Gen: Elderly female, in some distress with pain HEENT:moist mucosa, supple neck Chest: clear b/l, no added sounds CVS: N S1&S2, no murmurs GI: soft, NT, ND,  Musculoskeletal: warm, no edema, impaired mobility of the left hip due to pain     Data Review:    CBC Recent Labs  Lab 04/10/19 2245  WBC 11.0*  HGB 12.4  HCT 37.1  PLT 231  MCV 93.5  MCH 31.2  MCHC 33.4  RDW 14.0  LYMPHSABS 1.5  MONOABS 0.5  EOSABS 0.2  BASOSABS 0.0    Chemistries  Recent Labs  Lab 04/10/19 2245  NA 137  K 3.8  CL 106  CO2 22  GLUCOSE 177*  BUN 13  CREATININE 0.66  CALCIUM 9.6   ------------------------------------------------------------------------------------------------------------------ No results for input(s): CHOL, HDL, LDLCALC, TRIG, CHOLHDL, LDLDIRECT in the last 72 hours.  Lab Results  Component Value Date   HGBA1C 6.8 (H) 04/10/2019    ------------------------------------------------------------------------------------------------------------------ No results for input(s): TSH, T4TOTAL, T3FREE, THYROIDAB in the last 72 hours.  Invalid input(s): FREET3 ------------------------------------------------------------------------------------------------------------------ No results for input(s): VITAMINB12, FOLATE, FERRITIN, TIBC, IRON, RETICCTPCT in the last 72 hours.  Coagulation profile Recent Labs  Lab 04/10/19 2245  INR 1.1    No results for input(s): DDIMER in the last 72 hours.  Cardiac Enzymes No results for input(s): CKMB, TROPONINI, MYOGLOBIN in the last 168 hours.  Invalid input(s): CK ------------------------------------------------------------------------------------------------------------------ No results found for: BNP  Inpatient Medications  Scheduled Meds:  Chlorhexidine Gluconate Cloth  6 each Topical Q0600   famotidine  20 mg Oral Daily   insulin aspart  0-15 Units Subcutaneous TID WC   lisinopril  20 mg Oral Daily   metoprolol tartrate  12.5 mg Oral BID   mupirocin ointment  1 application Nasal BID   pantoprazole  40 mg Oral BID   simvastatin  40 mg Oral q1800   spironolactone  25 mg Oral Daily   Continuous Infusions:  sodium chloride      ceFAZolin (ANCEF) IV Stopped (04/10/19 2334)   methocarbamol (ROBAXIN) IV 500 mg (04/11/19 0940)   PRN Meds:.sodium chloride, HYDROcodone-acetaminophen, methocarbamol (ROBAXIN) IV, morphine injection, senna-docusate  Micro Results Recent Results (from the past 240 hour(s))  Respiratory Panel by RT PCR (Flu A&B, Covid) - Nasopharyngeal Swab     Status: None   Collection Time: 04/10/19 10:45 PM   Specimen: Nasopharyngeal Swab  Result Value Ref Range Status   SARS Coronavirus 2 by RT PCR NEGATIVE NEGATIVE Final    Comment: (NOTE) SARS-CoV-2 target nucleic acids are NOT DETECTED. The SARS-CoV-2 RNA is generally detectable in upper  respiratoy specimens during the acute phase of infection. The lowest concentration of SARS-CoV-2 viral copies this assay can detect is 131 copies/mL. A negative result does not preclude SARS-Cov-2 infection and should not be used as the sole basis for treatment or other patient management decisions. A negative result may occur with  improper specimen collection/handling, submission of specimen other than nasopharyngeal swab, presence of viral mutation(s) within the areas targeted by this assay, and inadequate number of viral copies (<131 copies/mL). A negative result must be combined with clinical observations, patient history, and epidemiological information. The expected result is Negative. Fact Sheet for Patients:  PinkCheek.be Fact Sheet for Healthcare Providers:  GravelBags.it This test is not yet ap proved or cleared by the [place] FDA and  has been authorized for detection and/or diagnosis of SARS-CoV-2 by FDA under an Emergency Use Authorization (EUA). This EUA will remain  in effect (meaning this test can be used) for the duration of the COVID-19 declaration under Section 564(b)(1) of the Act, 21 U.S.C. section 360bbb-3(b)(1), unless the authorization is terminated or revoked sooner.    Influenza A by PCR NEGATIVE NEGATIVE Final   Influenza B by PCR NEGATIVE NEGATIVE Final    Comment: (NOTE) The Xpert Xpress SARS-CoV-2/FLU/RSV assay is intended as an aid in  the diagnosis of influenza from Nasopharyngeal swab specimens and  should not be used as a sole basis for treatment. Nasal washings and  aspirates are unacceptable for Xpert Xpress SARS-CoV-2/FLU/RSV  testing. Fact Sheet for Patients: PinkCheek.be Fact Sheet for Healthcare Providers: GravelBags.it This test is not yet approved or cleared by the [place] FDA and  has been authorized for  detection and/or diagnosis of SARS-CoV-2 by  FDA under an Emergency Use Authorization (EUA). This EUA will remain  in effect (meaning this test can be used) for the duration of the  Covid-19 declaration under Section 564(b)(1) of the Act, 21  U.S.C. section 360bbb-3(b)(1), unless the authorization is  terminated or revoked. Performed at [facility], [address], [place], [place] [address]   Surgical pcr screen     Status: Abnormal   Collection Time: 04/11/19  3:01 AM   Specimen: Nasal Mucosa; Nasal Swab  Result Value Ref Range Status   MRSA, PCR POSITIVE (A) NEGATIVE Final    Comment: RESULT CALLED TO, READ BACK BY AND VERIFIED WITH: TRICIA KING 04/11/19 AT 0443 HS    Staphylococcus aureus POSITIVE (A) NEGATIVE Final    Comment: (NOTE) The Xpert SA Assay (FDA approved for NASAL specimens in patients 59 years of age and older), is one component of a comprehensive surveillance program. It is not intended to diagnose infection nor to guide or monitor treatment. Performed at [facility], [address], [place], [place] [address]     Radiology Reports CT Head Wo Contrast  Result Date: 04/10/2019 CLINICAL DATA:  Recent fall with headaches and neck pain, initial encounter EXAM: CT HEAD WITHOUT CONTRAST CT CERVICAL SPINE WITHOUT CONTRAST TECHNIQUE: Multidetector CT imaging of the head and cervical spine was performed following the standard protocol without intravenous contrast. Multiplanar CT image reconstructions of the cervical spine were also generated. COMPARISON:  01/28/2016, 01/14/2018 FINDINGS: CT HEAD FINDINGS Brain: No evidence of acute infarction, hemorrhage, hydrocephalus, extra-axial collection or mass lesion/mass effect. Chronic atrophic and ischemic changes are again noted and stable. Vascular: No hyperdense vessel or unexpected calcification. Skull: Normal. Negative for fracture or focal lesion. Sinuses/Orbits: Mucosal retention cyst is noted in the left  maxillary antrum stable  from the prior exams. The paranasal sinuses are otherwise within normal limits. Mastoid air cells on the left demonstrate opacification. This is stable from the prior exam of 2020. Other: None. CT CERVICAL SPINE FINDINGS Alignment: Within normal limits. Skull base and vertebrae: 7 cervical segments are well visualized. Vertebral body height is well maintained. Multilevel facet hypertrophic changes and osteophytic changes are seen. No acute fracture or acute facet abnormality is noted. Neural foraminal narrowing is noted bilaterally. Soft tissues and spinal canal: Scattered calcifications and hypodense nodular changes are noted throughout the thyroid. None of these are significant by size criteria. The remaining soft tissues of the neck are within normal limits. Upper chest: Visualized lung apices are within normal limits. Other: None IMPRESSION: CT of the head: Chronic atrophic and ischemic changes without acute intracranial abnormality. Chronic left mastoid opacification CT of the cervical spine: Multilevel degenerative changes without acute abnormality. Multiple small hypodensities and calcifications noted throughout the thyroid. No follow-up recommended unless clinically warranted (ref: J Am Coll Radiol. 2015 Feb;12(2): 143-50). Electronically Signed   By: Inez Catalina M.D.   On: 04/10/2019 21:35   CT Cervical Spine Wo Contrast  Result Date: 04/10/2019 CLINICAL DATA:  Recent fall with headaches and neck pain, initial encounter EXAM: CT HEAD WITHOUT CONTRAST CT CERVICAL SPINE WITHOUT CONTRAST TECHNIQUE: Multidetector CT imaging of the head and cervical spine was performed following the standard protocol without intravenous contrast. Multiplanar CT image reconstructions of the cervical spine were also generated. COMPARISON:  01/28/2016, 01/14/2018 FINDINGS: CT HEAD FINDINGS Brain: No evidence of acute infarction, hemorrhage, hydrocephalus, extra-axial collection or mass lesion/mass  effect. Chronic atrophic and ischemic changes are again noted and stable. Vascular: No hyperdense vessel or unexpected calcification. Skull: Normal. Negative for fracture or focal lesion. Sinuses/Orbits: Mucosal retention cyst is noted in the left maxillary antrum stable from the prior exams. The paranasal sinuses are otherwise within normal limits. Mastoid air cells on the left demonstrate opacification. This is stable from the prior exam of 2020. Other: None. CT CERVICAL SPINE FINDINGS Alignment: Within normal limits. Skull base and vertebrae: 7 cervical segments are well visualized. Vertebral body height is well maintained. Multilevel facet hypertrophic changes and osteophytic changes are seen. No acute fracture or acute facet abnormality is noted. Neural foraminal narrowing is noted bilaterally. Soft tissues and spinal canal: Scattered calcifications and hypodense nodular changes are noted throughout the thyroid. None of these are significant by size criteria. The remaining soft tissues of the neck are within normal limits. Upper chest: Visualized lung apices are within normal limits. Other: None IMPRESSION: CT of the head: Chronic atrophic and ischemic changes without acute intracranial abnormality. Chronic left mastoid opacification CT of the cervical spine: Multilevel degenerative changes without acute abnormality. Multiple small hypodensities and calcifications noted throughout the thyroid. No follow-up recommended unless clinically warranted (ref: J Am Coll Radiol. 2015 Feb;12(2): 143-50). Electronically Signed   By: Inez Catalina M.D.   On: 04/10/2019 21:35   DG Chest Port 1 View  Result Date: 04/10/2019 CLINICAL DATA:  Preop EXAM: PORTABLE CHEST 1 VIEW COMPARISON:  02/28/2017 FINDINGS: Increased interstitial markings. This appearance favors chronic interstitial lung disease over interstitial edema or mild pneumonia. Mild right lower lung opacity favors atelectasis. No pleural effusion or pneumothorax.  Cardiomegaly. Postsurgical changes related to prior CABG. Thoracic aortic atherosclerosis. Degenerative changes of the bilateral shoulders. IMPRESSION: Increased interstitial markings, favoring chronic interstitial lung disease, with mild right lower lobe atelectasis. Electronically Signed   By: Henderson Newcomer.D.  On: 04/10/2019 22:28   DG HIP UNILAT WITH PELVIS 2-3 VIEWS LEFT  Result Date: 04/10/2019 CLINICAL DATA:  Pain EXAM: DG HIP (WITH OR WITHOUT PELVIS) 2-3V LEFT COMPARISON:  None. FINDINGS: There is an acute displaced intratrochanteric fracture of the proximal left femur. Multiple displaced fracture fragments are noted. There is diffuse osteopenia. There are degenerative changes of both hips. There is no dislocation. Vascular calcifications are noted. IMPRESSION: Acute displaced intratrochanteric fracture of the proximal left femur. Electronically Signed   By: Constance Holster M.D.   On: 04/10/2019 21:53    Time Spent in minutes  35   Elliett Guarisco M.D on 04/11/2019 at 11:55 AM  Between 7am to 7pm - Pager - [phone]  After 7pm go to [email or web address] - password [facility]  [facility] -  Office  [phone]

## 2019-04-11 NOTE — ED Notes (Addendum)
Pt has both hearing aids in  2 white metal rings removed from left ring finger and given to [place], per pt's request

## 2019-04-12 ENCOUNTER — Encounter
Admission: RE | Admit: 2019-04-12 | Discharge: 2019-04-12 | Disposition: A | Discharge status: Home / Self Care | Payer: Medicare Other | Source: Ambulatory Visit | Attending: Internal Medicine | Admitting: Internal Medicine

## 2019-04-12 ENCOUNTER — Inpatient Hospital Stay: Payer: Medicare Other

## 2019-04-12 DIAGNOSIS — S62521A Displaced fracture of distal phalanx of right thumb, initial encounter for closed fracture: Secondary | ICD-10-CM

## 2019-04-12 LAB — CBC
HCT: 29.6 % — ABNORMAL LOW (ref 36.0–46.0)
Hemoglobin: 9.9 g/dL — ABNORMAL LOW (ref 12.0–15.0)
MCH: 31.3 pg (ref 26.0–34.0)
MCHC: 33.4 g/dL (ref 30.0–36.0)
MCV: 93.7 fL (ref 80.0–100.0)
Platelets: 183 10*3/uL (ref 150–400)
RBC: 3.16 MIL/uL — ABNORMAL LOW (ref 3.87–5.11)
RDW: 14.4 % (ref 11.5–15.5)
WBC: 9.6 10*3/uL (ref 4.0–10.5)
nRBC: 0 % (ref 0.0–0.2)

## 2019-04-12 LAB — GLUCOSE, CAPILLARY
Glucose-Capillary: 171 mg/dL — ABNORMAL HIGH (ref 70–99)
Glucose-Capillary: 152 mg/dL — ABNORMAL HIGH (ref 70–99)
Glucose-Capillary: 135 mg/dL — ABNORMAL HIGH (ref 70–99)

## 2019-04-12 LAB — BASIC METABOLIC PANEL
Anion gap: 8 (ref 5–15)
BUN: 13 mg/dL (ref 8–23)
CO2: 21 mmol/L — ABNORMAL LOW (ref 22–32)
Calcium: 9 mg/dL (ref 8.9–10.3)
Chloride: 108 mmol/L (ref 98–111)
Creatinine, Ser: 0.62 mg/dL (ref 0.44–1.00)
GFR calc Af Amer: >60 mL/min (ref >60)
GFR calc non Af Amer: >60 mL/min (ref >60)
Glucose, Bld: 185 mg/dL — ABNORMAL HIGH (ref 70–99)
Potassium: 4.4 mmol/L (ref 3.5–5.1)
Sodium: 137 mmol/L (ref 135–145)

## 2019-04-12 MED ORDER — TRAMADOL HCL 50 MG PO TABS: 50.0000 mg | ORAL_TABLET | Freq: Four times a day (QID) | ORAL | 0 refills | Status: DC | PRN

## 2019-04-12 MED ORDER — ENOXAPARIN SODIUM 40 MG/0.4ML ~~LOC~~ SOLN: 40.0000 mg | SUBCUTANEOUS | 0 refills | Status: DC

## 2019-04-12 MED ORDER — OXYCODONE HCL 5 MG PO TABS: 2.5000 mg | ORAL_TABLET | ORAL | 0 refills | Status: DC | PRN

## 2019-04-12 NOTE — Progress Notes (Signed)
Physical Therapy Treatment Patient Details Name: Megan Salinas MRN: [id number] DOB: 11-16-1926 Today's Date: 04/12/2019    History of Present Illness Pt is a 86 y/o F s/p L hip IM nail (04/11/2019) following a left intertrochanteric hip fracture from an accidental fall at home on 04/10/2019. PMH includes: CAD s/p CAGB, CHF, DM, TIA, HLD, HTN, and GERD    PT Comments    Pt pleasant and motivated to participate during the session. Pt had some mild desaturation with exercise after visibly holding her breathe. Low SpO2 was 87% but with cueing to take deep breaths, pt's SpO2 returned to the low 90's in <30 seconds. In sitting, pt stated that she was lightheaded and in standing, pt stated that she had some nausea, but both symptoms resolved once pt returned to supine. Pt complained of muscle spasms t/o the session and pt stated that they limited her ability to complete OOB exercise and ambulation. Despite muscle spasms limiting functional mobility, pt was able to take a few steps forward, backward, and to the side near the EOB before returning to supine. Pt will benefit from PT services in a SNF setting upon discharge to safely address deficits listed in patient problem list for decreased caregiver assistance and eventual return to PLOF.    Follow Up Recommendations  SNF     Equipment Recommendations  Rolling walker with 5" wheels;3in1 (PT)    Recommendations for Other Services       Precautions / Restrictions Precautions Precautions: Fall Restrictions Weight Bearing Restrictions: Yes LLE Weight Bearing: Weight bearing as tolerated    Mobility  Bed Mobility Overal bed mobility: Needs Assistance Bed Mobility: Supine to Sit;Sit to Supine     Supine to sit: Mod assist(For trunk and LE's) Sit to supine: Mod assist(For LE's)      Transfers Overall transfer level: Needs assistance Equipment used: Rolling walker (2 wheeled) Transfers: Sit to/from Stand Sit to Stand: Min assist;Mod  assist         General transfer comment: min-mod assist for sit to stand transfer. Cueing for hand placement on RW  Ambulation/Gait Ambulation/Gait assistance: Min guard;Min assist Gait Distance (Feet): 5 Feet Assistive device: Rolling walker (2 wheeled) Gait Pattern/deviations: Decreased step length - right;Decreased step length - left;Decreased stance time - left;Decreased weight shift to left;Antalgic Gait velocity: decreased   General Gait Details: Several small steps near EOB. During LLE WB, pt required occasional min A but was mostly CGA   Stairs             Wheelchair Mobility    Modified Rankin (Stroke Patients Only)       Balance Overall balance assessment: Needs assistance Sitting-balance support: Single extremity supported;Feet unsupported Sitting balance-Leahy Scale: Fair Sitting balance - Comments: With no UE support, pt had difficulty with static sitting. Pt was much more steady with single UE assist   Standing balance support: Bilateral upper extremity supported;During functional activity Standing balance-Leahy Scale: Fair Standing balance comment: Heavy BUE assist through RW, especially when in LLE stance                            Cognition Arousal/Alertness: Awake/alert Behavior During Therapy: WFL for tasks assessed/performed Overall Cognitive Status: Within Functional Limits for tasks assessed  Exercises Total Joint Exercises Ankle Circles/Pumps: AROM;Strengthening;Both;10 reps Quad Sets: Strengthening;Both;10 reps Gluteal Sets: Strengthening;Both;10 reps Towel Squeeze: Strengthening;Both;10 reps Hip ABduction/ADduction: AAROM;Both;10 reps Straight Leg Raises: AAROM;Both;10 reps Long Arc Quad: AROM;Strengthening;Both;10 reps Other Exercises Other Exercises: HEP education Other Exercises: Static EOB sitting Other Exercises: Breathing coaching    General Comments         Pertinent Vitals/Pain Pain Assessment: No/denies pain Pain Score: 8 (8/10 in L hip, 10/10 with muscle spasms) Pain Location: L hip Pain Descriptors / Indicators: Aching;Sore;Moaning;Spasm;Grimacing Pain Intervention(s): Premedicated before session;Monitored during session;Limited activity within patient's tolerance    Home Living Family/patient expects to be discharged to:: Private residence Living Arrangements: Alone   Type of Home: Independent living facility Home Access: Level entry   Home Layout: One level Home Equipment: Grab bars - toilet;Walker - 4 wheels;Walker - standard;Grab bars - tub/shower;Tub bench      Prior Function Level of Independence: Independent with assistive device(s)      Comments: [place] [facility] with 4WW, 1 fall in past six months   PT Goals (current goals can now be found in the care plan section) Acute Rehab PT Goals Patient Stated Goal: "back on feet" PT Goal Formulation: With patient Time For Goal Achievement: 04/25/19 Potential to Achieve Goals: Good Progress towards PT goals: Progressing toward goals    Frequency    BID      PT Plan Current plan remains appropriate    Co-evaluation              AM-PAC PT "6 Clicks" Mobility   Outcome Measure  Help needed turning from your back to your side while in a flat bed without using bedrails?: A Little Help needed moving from lying on your back to sitting on the side of a flat bed without using bedrails?: A Lot Help needed moving to and from a bed to a chair (including a wheelchair)?: A Lot Help needed standing up from a chair using your arms (e.g., wheelchair or bedside chair)?: A Lot Help needed to walk in hospital room?: Total Help needed climbing 3-5 steps with a railing? : Total 6 Click Score: 11    End of Session Equipment Utilized During Treatment: Gait belt Activity Tolerance: Patient tolerated treatment well Patient left: in bed;with call bell/phone within reach;with  family/visitor present(OT in room) Nurse Communication: Mobility status;Precautions;Weight bearing status(pt complains of muscle spasms t/o session) PT Visit Diagnosis: Unsteadiness on feet (R26.81);Other abnormalities of gait and mobility (R26.89);Muscle weakness (generalized) (M62.81);History of falling (Z91.81);Pain Pain - Right/Left: Left Pain - part of body: Hip     Time: 1350-1433 PT Time Calculation (min) (ACUTE ONLY): 43 min  Charges:  $Therapeutic Exercise: 8-22 mins $Therapeutic Activity: 8-22 mins                     Annabelle Harman, SPT 04/12/19 2:59 PM

## 2019-04-12 NOTE — Evaluation (Signed)
Physical Therapy Evaluation Patient Details Name: Megan Salinas MRN: [id number] DOB: Apr 08, 1926 Today's Date: 04/12/2019   History of Present Illness  Pt is a 86 y/o F s/p L hip IM nail (04/11/2019) following a left intertrochanteric hip fracture from an accidental fall at home on 04/10/2019. PMH includes: CAD s/p CAGB, CHF, DM, TIA, HLD, HTN, and GERD    Clinical Impression  Pt pleasant and motivated to participate during the session. Pt found on RA with HR and SpO2 WNL t/o session. Pt required mod assist for bed mobility but was mostly CGA for sit-to/from-stand transfers besides a brief moment of min A as she first put weight through her LLE. In standing, pt was able to put weight through her LLE but utilized mod-max BUE through the RW. Pt was able to take a few small steps but given difficulty with LLE WB, pt slid her R foot on the ground instead of picking it up. Pt will benefit from PT services in a SNF setting upon discharge to safely address deficits listed in patient problem list for decreased caregiver assistance and eventual return to PLOF.     Follow Up Recommendations SNF    Equipment Recommendations  Rolling walker with 5" wheels;3in1 (PT)    Recommendations for Other Services       Precautions / Restrictions Precautions Precautions: Fall Restrictions Weight Bearing Restrictions: Yes LLE Weight Bearing: Weight bearing as tolerated      Mobility  Bed Mobility Overal bed mobility: Needs Assistance Bed Mobility: Supine to Sit;Sit to Supine     Supine to sit: Mod assist(For trunk and LE's) Sit to supine: Mod assist(For LE's)      Transfers Overall transfer level: Needs assistance Equipment used: Rolling walker (2 wheeled) Transfers: Sit to/from Stand Sit to Stand: Min assist;Min guard         General transfer comment: Mostly CGA, bried minA when pt first put weight through her LLE  Ambulation/Gait Ambulation/Gait assistance: Min guard Gait Distance (Feet):  1 Feet Assistive device: Rolling walker (2 wheeled)       General Gait Details: 2 small steps EOB for positioning. Pt was unable to fully lift the RLE off from the ground but was able to slide it  Stairs            Wheelchair Mobility    Modified Rankin (Stroke Patients Only)       Balance Overall balance assessment: Needs assistance Sitting-balance support: Single extremity supported;Feet unsupported Sitting balance-Leahy Scale: Fair Sitting balance - Comments: Lean towards supporting arm   Standing balance support: Bilateral upper extremity supported;During functional activity Standing balance-Leahy Scale: Fair Standing balance comment: Heavy BUE assist through RW, especially when in LLE stance                             Pertinent Vitals/Pain Pain Location: L hip with movement and WB Pain Descriptors / Indicators: Aching;Sore;Moaning;Spasm;Grimacing Pain Intervention(s): Monitored during session;Premedicated before session    [place] expects to be discharged to:: Private residence Living Arrangements: Alone   Type of Home: Independent living facility Home Access: Level entry     Home Layout: One level Home Equipment: Grab bars - toilet;Walker - 4 wheels;Walker - standard;Grab bars - tub/shower;Tub bench      Prior Function Level of Independence: Independent with assistive device(s)         Comments: [place] amb with 4WW, 1 fall in past six months  Hand Dominance        Extremity/Trunk Assessment        Lower Extremity Assessment Lower Extremity Assessment: Generalized weakness;LLE deficits/detail LLE: Unable to fully assess due to pain       Communication   Communication: HOH(Hearing aids did not have batteries)  Cognition Arousal/Alertness: Awake/alert Behavior During Therapy: WFL for tasks assessed/performed Overall Cognitive Status: Within Functional Limits for tasks assessed                                         General Comments      Exercises Total Joint Exercises Ankle Circles/Pumps: AROM;Strengthening;Both;10 reps Quad Sets: Strengthening;Both;10 reps Gluteal Sets: Strengthening;Both;10 reps Long Arc Quad: AROM;Strengthening;Both;10 reps Other Exercises Other Exercises: HEP education   Assessment/Plan    PT Assessment Patient needs continued PT services  PT Problem List Decreased strength;Decreased mobility;Decreased range of motion;Decreased knowledge of precautions;Decreased activity tolerance;Decreased balance;Decreased knowledge of use of DME;Pain       PT Treatment Interventions DME instruction;Therapeutic exercise;Gait training;Balance training;Stair training;Functional mobility training;Therapeutic activities;Patient/family education    PT Goals (Current goals can be found in the Care Plan section)  Acute Rehab PT Goals Patient Stated Goal: "back on feet" PT Goal Formulation: With patient Time For Goal Achievement: 04/25/19 Potential to Achieve Goals: Good    Frequency BID   Barriers to discharge        Co-evaluation               AM-PAC PT "6 Clicks" Mobility  Outcome Measure Help needed turning from your back to your side while in a flat bed without using bedrails?: A Little Help needed moving from lying on your back to sitting on the side of a flat bed without using bedrails?: A Lot Help needed moving to and from a bed to a chair (including a wheelchair)?: A Lot Help needed standing up from a chair using your arms (e.g., wheelchair or bedside chair)?: A Little Help needed to walk in hospital room?: Total Help needed climbing 3-5 steps with a railing? : Total 6 Click Score: 12    End of Session Equipment Utilized During Treatment: Gait belt Activity Tolerance: Patient tolerated treatment well Patient left: in bed;with call bell/phone within reach;with nursing/sitter in room;with family/visitor present;with SCD's  reapplied(Nurse tech in room) Nurse Communication: Mobility status;Precautions;Weight bearing status(One SCD missing) PT Visit Diagnosis: Unsteadiness on feet (R26.81);Other abnormalities of gait and mobility (R26.89);Muscle weakness (generalized) (M62.81);History of falling (Z91.81);Pain Pain - Right/Left: Left Pain - part of body: Hip    Time: [id number] PT Time Calculation (min) (ACUTE ONLY): 58 min   Charges:              Annabelle Harman, SPT 04/12/19 1:16 PM

## 2019-04-12 NOTE — Plan of Care (Signed)
  Problem: Education: Goal: Knowledge of General Education information will improve Description: Including pain rating scale, medication(s)/side effects and non-pharmacologic comfort measures Outcome: Progressing   Problem: Clinical Measurements: Goal: Cardiovascular complication will be avoided Outcome: Progressing   Problem: Coping: Goal: Level of anxiety will decrease Outcome: Progressing   

## 2019-04-12 NOTE — Progress Notes (Signed)
PROGRESS NOTE                                                                                                                                                                                                             Patient Demographics:    Megan Salinas, is a 86 y.o. female, DOB - Aug 10, 1926, [id number]  Admit date - 04/10/2019   Admitting Physician [name], MD  Outpatient Primary MD for the patient is [name], MD  LOS - 2  Outpatient Specialists: Dr. Nehemiah Massed  Chief Complaint  Patient presents with  . Fall       Brief Narrative 86 year old female with chronic systolic CHF (last EF of [id number]), CAD s/p CABG in 2012, hypertension and type 2 diabetes mellitus presented with mechanical fall at home.  Uses a walker to ambulate at baseline.  No loss of consciousness.  She landed on her left hip and in the ED was found to have displaced left intertrochanteric fracture.  No other injuries sustained. Admitted for further management.    Subjective:   Seen and examined. C/o hip spasms, did not know she had to ask for pain meds. Worked with PT today.    Assessment  & Plan :    Principal Problem:   Displaced intertrochanteric fracture of left femur ([place]) Secondary to mechanical fall.s/p IM nailing on 3/29. Tolerated well. Pain control with percocet and robaxin for muscle spasms. Bowel regimen.Foley dced. lovenox for DVT prophylaxis. Follow up with Ortho in 2 weeks as outpt.  PT recommends SNF.   Active Problems: Displaced fracture of right thumb Secondary to fall. Had bruising over right thumb but good ROM.  Will d/w ortho on further recommendations.     Chronic systolic CHF (congestive heart failure), NYHA class 3 (HCC)  Coronary artery disease with CABG in 2012 euvolemic.  EF 50-55% with grade 1 DD.  Continue metoprolol, Aldactone and statin. Cardiology consult appreciated for preoperative clearance.   moderate risk for surgery.      Type 2 diabetes mellitus without complication (HCC) Stable.  Recent A1c of 6.8.  Monitor on sliding scale coverage.       Code Status : Full code  Family Communication  : Daughter at bedside  Disposition Plan  : SNF possibly tomorrow  Barriers For Discharge : post op.   Consults  :  Orthopedics (Dr. Posey Pronto), cardiology  Procedures  :  IM nail left hip  DVT Prophylaxis  :  Lovenox -  Lab Results  Component Value Date   PLT 183 04/12/2019    Antibiotics  :    Anti-infectives (From admission, onward)   Start     Dose/Rate Route Frequency Ordered Stop   04/11/19 2000  ceFAZolin (ANCEF) IVPB 1 g/50 mL premix     1 g 100 mL/hr over 30 Minutes Intravenous Every 6 hours 04/11/19 1657 04/12/19 1359   04/10/19 2230  ceFAZolin (ANCEF) IVPB 1 g/50 mL premix     1 g 100 mL/hr over 30 Minutes Intravenous  Once 04/10/19 2220 04/11/19 1359        Objective:   Vitals:   04/11/19 2230 04/12/19 0420 04/12/19 0744 04/12/19 1233  BP: (!) 134/59 (!) 135/59 (!) 154/67 (!) 145/60  Pulse: 92 90 90 92  Resp: 17 18 17 17   Temp: 98.4 F (36.9 C) 98.2 F (36.8 C) 98.3 F (36.8 C) 98.4 F (36.9 C)  TempSrc: Oral Oral Oral Oral  SpO2: 95% 97% 93% 94%    Wt Readings from Last 3 Encounters:  08/27/18 69.9 kg  08/24/18 70.8 kg  06/01/18 74.8 kg     Intake/Output Summary (Last 24 hours) at 04/12/2019 1403 Last data filed at 04/11/2019 1617 Gross per 24 hour  Intake 150 ml  Output 340 ml  Net -190 ml    Physical exam' NAD  HEENT: moist mucosa Chest: clear  CVS: NS1&S2  GI: soft, NT, ND  Musculoskeletal: warm, clean left hip dressing      Data Review:    CBC Recent Labs  Lab 04/10/19 2245 04/12/19 0534  WBC 11.0* 9.6  HGB 12.4 9.9*  HCT 37.1 29.6*  PLT 231 183  MCV 93.5 93.7  MCH 31.2 31.3  MCHC 33.4 33.4  RDW 14.0 14.4  LYMPHSABS 1.5  --   MONOABS 0.5  --   EOSABS 0.2  --   BASOSABS 0.0  --     Chemistries  Recent  Labs  Lab 04/10/19 2245 04/12/19 0534  NA 137 137  K 3.8 4.4  CL 106 108  CO2 22 21*  GLUCOSE 177* 185*  BUN 13 13  CREATININE 0.66 0.62  CALCIUM 9.6 9.0   ------------------------------------------------------------------------------------------------------------------ No results for input(s): CHOL, HDL, LDLCALC, TRIG, CHOLHDL, LDLDIRECT in the last 72 hours.  Lab Results  Component Value Date   HGBA1C 6.8 (H) 04/10/2019   ------------------------------------------------------------------------------------------------------------------ No results for input(s): TSH, T4TOTAL, T3FREE, THYROIDAB in the last 72 hours.  Invalid input(s): FREET3 ------------------------------------------------------------------------------------------------------------------ No results for input(s): VITAMINB12, FOLATE, FERRITIN, TIBC, IRON, RETICCTPCT in the last 72 hours.  Coagulation profile Recent Labs  Lab 04/10/19 2245  INR 1.1    No results for input(s): DDIMER in the last 72 hours.  Cardiac Enzymes No results for input(s): CKMB, TROPONINI, MYOGLOBIN in the last 168 hours.  Invalid input(s): CK ------------------------------------------------------------------------------------------------------------------ No results found for: BNP  Inpatient Medications  Scheduled Meds: . acetaminophen  1,000 mg Oral Q8H  . docusate sodium  100 mg Oral BID  . enoxaparin (LOVENOX) injection  30 mg Subcutaneous Q24H  . famotidine  20 mg Oral Daily  . insulin aspart  0-15 Units Subcutaneous TID WC  . lisinopril  20 mg Oral Daily  . metoprolol tartrate  12.5 mg Oral BID  . multivitamin with minerals  1 tablet Oral Daily  . mupirocin ointment  1 application Nasal BID  .  pantoprazole  40 mg Oral BID  . Ensure Max Protein  11 oz Oral BID BM  . simvastatin  40 mg Oral q1800  . spironolactone  25 mg Oral Daily   Continuous Infusions: . methocarbamol (ROBAXIN) IV 500 mg (04/12/19 1256)   PRN  Meds:.acetaminophen, bisacodyl, HYDROmorphone (DILAUDID) injection, methocarbamol (ROBAXIN) IV, metoCLOPramide **OR** metoCLOPramide (REGLAN) injection, ondansetron **OR** ondansetron (ZOFRAN) IV, oxyCODONE, oxyCODONE, senna-docusate, senna-docusate, sodium phosphate, traMADol  Micro Results Recent Results (from the past 240 hour(s))  Respiratory Panel by RT PCR (Flu A&B, Covid) - Nasopharyngeal Swab     Status: None   Collection Time: 04/10/19 10:45 PM   Specimen: Nasopharyngeal Swab  Result Value Ref Range Status   SARS Coronavirus 2 by RT PCR NEGATIVE NEGATIVE Final    Comment: (NOTE) SARS-CoV-2 target nucleic acids are NOT DETECTED. The SARS-CoV-2 RNA is generally detectable in upper respiratoy specimens during the acute phase of infection. The lowest concentration of SARS-CoV-2 viral copies this assay can detect is 131 copies/mL. A negative result does not preclude SARS-Cov-2 infection and should not be used as the sole basis for treatment or other patient management decisions. A negative result may occur with  improper specimen collection/handling, submission of specimen other than nasopharyngeal swab, presence of viral mutation(s) within the areas targeted by this assay, and inadequate number of viral copies (<131 copies/mL). A negative result must be combined with clinical observations, patient history, and epidemiological information. The expected result is Negative. Fact Sheet for Patients:  PinkCheek.be Fact Sheet for Healthcare Providers:  GravelBags.it This test is not yet ap proved or cleared by the [place] FDA and  has been authorized for detection and/or diagnosis of SARS-CoV-2 by FDA under an Emergency Use Authorization (EUA). This EUA will remain  in effect (meaning this test can be used) for the duration of the COVID-19 declaration under Section 564(b)(1) of the Act, 21 U.S.C. section 360bbb-3(b)(1),  unless the authorization is terminated or revoked sooner.    Influenza A by PCR NEGATIVE NEGATIVE Final   Influenza B by PCR NEGATIVE NEGATIVE Final    Comment: (NOTE) The Xpert Xpress SARS-CoV-2/FLU/RSV assay is intended as an aid in  the diagnosis of influenza from Nasopharyngeal swab specimens and  should not be used as a sole basis for treatment. Nasal washings and  aspirates are unacceptable for Xpert Xpress SARS-CoV-2/FLU/RSV  testing. Fact Sheet for Patients: PinkCheek.be Fact Sheet for Healthcare Providers: GravelBags.it This test is not yet approved or cleared by the [place] FDA and  has been authorized for detection and/or diagnosis of SARS-CoV-2 by  FDA under an Emergency Use Authorization (EUA). This EUA will remain  in effect (meaning this test can be used) for the duration of the  Covid-19 declaration under Section 564(b)(1) of the Act, 21  U.S.C. section 360bbb-3(b)(1), unless the authorization is  terminated or revoked. Performed at [facility], [address], [place], [place] [address]   Surgical pcr screen     Status: Abnormal   Collection Time: 04/11/19  3:01 AM   Specimen: Nasal Mucosa; Nasal Swab  Result Value Ref Range Status   MRSA, PCR POSITIVE (A) NEGATIVE Final    Comment: RESULT CALLED TO, READ BACK BY AND VERIFIED WITH: TRICIA KING 04/11/19 AT 0443 HS    Staphylococcus aureus POSITIVE (A) NEGATIVE Final    Comment: (NOTE) The Xpert SA Assay (FDA approved for NASAL specimens in patients 46 years of age and older), is one component of a comprehensive surveillance program.  It is not intended to diagnose infection nor to guide or monitor treatment. Performed at [facility], [address] [place], [place] [address]     Radiology Reports CT Head Wo Contrast  Result Date: 04/10/2019 CLINICAL DATA:  Recent fall with headaches and neck pain, initial encounter EXAM:  CT HEAD WITHOUT CONTRAST CT CERVICAL SPINE WITHOUT CONTRAST TECHNIQUE: Multidetector CT imaging of the head and cervical spine was performed following the standard protocol without intravenous contrast. Multiplanar CT image reconstructions of the cervical spine were also generated. COMPARISON:  01/28/2016, 01/14/2018 FINDINGS: CT HEAD FINDINGS Brain: No evidence of acute infarction, hemorrhage, hydrocephalus, extra-axial collection or mass lesion/mass effect. Chronic atrophic and ischemic changes are again noted and stable. Vascular: No hyperdense vessel or unexpected calcification. Skull: Normal. Negative for fracture or focal lesion. Sinuses/Orbits: Mucosal retention cyst is noted in the left maxillary antrum stable from the prior exams. The paranasal sinuses are otherwise within normal limits. Mastoid air cells on the left demonstrate opacification. This is stable from the prior exam of 2020. Other: None. CT CERVICAL SPINE FINDINGS Alignment: Within normal limits. Skull base and vertebrae: 7 cervical segments are well visualized. Vertebral body height is well maintained. Multilevel facet hypertrophic changes and osteophytic changes are seen. No acute fracture or acute facet abnormality is noted. Neural foraminal narrowing is noted bilaterally. Soft tissues and spinal canal: Scattered calcifications and hypodense nodular changes are noted throughout the thyroid. None of these are significant by size criteria. The remaining soft tissues of the neck are within normal limits. Upper chest: Visualized lung apices are within normal limits. Other: None IMPRESSION: CT of the head: Chronic atrophic and ischemic changes without acute intracranial abnormality. Chronic left mastoid opacification CT of the cervical spine: Multilevel degenerative changes without acute abnormality. Multiple small hypodensities and calcifications noted throughout the thyroid. No follow-up recommended unless clinically warranted (ref: J Am Coll  Radiol. 2015 Feb;12(2): 143-50). Electronically Signed   By: Inez Catalina M.D.   On: 04/10/2019 21:35   CT Cervical Spine Wo Contrast  Result Date: 04/10/2019 CLINICAL DATA:  Recent fall with headaches and neck pain, initial encounter EXAM: CT HEAD WITHOUT CONTRAST CT CERVICAL SPINE WITHOUT CONTRAST TECHNIQUE: Multidetector CT imaging of the head and cervical spine was performed following the standard protocol without intravenous contrast. Multiplanar CT image reconstructions of the cervical spine were also generated. COMPARISON:  01/28/2016, 01/14/2018 FINDINGS: CT HEAD FINDINGS Brain: No evidence of acute infarction, hemorrhage, hydrocephalus, extra-axial collection or mass lesion/mass effect. Chronic atrophic and ischemic changes are again noted and stable. Vascular: No hyperdense vessel or unexpected calcification. Skull: Normal. Negative for fracture or focal lesion. Sinuses/Orbits: Mucosal retention cyst is noted in the left maxillary antrum stable from the prior exams. The paranasal sinuses are otherwise within normal limits. Mastoid air cells on the left demonstrate opacification. This is stable from the prior exam of 2020. Other: None. CT CERVICAL SPINE FINDINGS Alignment: Within normal limits. Skull base and vertebrae: 7 cervical segments are well visualized. Vertebral body height is well maintained. Multilevel facet hypertrophic changes and osteophytic changes are seen. No acute fracture or acute facet abnormality is noted. Neural foraminal narrowing is noted bilaterally. Soft tissues and spinal canal: Scattered calcifications and hypodense nodular changes are noted throughout the thyroid. None of these are significant by size criteria. The remaining soft tissues of the neck are within normal limits. Upper chest: Visualized lung apices are within normal limits. Other: None IMPRESSION: CT of the head: Chronic atrophic and ischemic changes  without acute intracranial abnormality. Chronic left mastoid  opacification CT of the cervical spine: Multilevel degenerative changes without acute abnormality. Multiple small hypodensities and calcifications noted throughout the thyroid. No follow-up recommended unless clinically warranted (ref: J Am Coll Radiol. 2015 Feb;12(2): 143-50). Electronically Signed   By: Inez Catalina M.D.   On: 04/10/2019 21:35   DG Chest Port 1 View  Result Date: 04/10/2019 CLINICAL DATA:  Preop EXAM: PORTABLE CHEST 1 VIEW COMPARISON:  02/28/2017 FINDINGS: Increased interstitial markings. This appearance favors chronic interstitial lung disease over interstitial edema or mild pneumonia. Mild right lower lung opacity favors atelectasis. No pleural effusion or pneumothorax. Cardiomegaly. Postsurgical changes related to prior CABG. Thoracic aortic atherosclerosis. Degenerative changes of the bilateral shoulders. IMPRESSION: Increased interstitial markings, favoring chronic interstitial lung disease, with mild right lower lobe atelectasis. Electronically Signed   By: Julian Hy M.D.   On: 04/10/2019 22:28   DG Finger Thumb Right  Result Date: 04/12/2019 CLINICAL DATA:  Cyanosis of finger tip. No known injury. EXAM: RIGHT THUMB 2+V COMPARISON:  None. FINDINGS: The bones are demineralized. There is a mildly displaced extra-articular fracture through the distal phalanx of the right thumb. There is no evidence of bone destruction or dislocation. Mild degenerative changes are present at the interphalangeal and metacarpal phalangeal joints. There is soft tissue swelling throughout the thumb without evidence of foreign body or soft tissue emphysema. IMPRESSION: Mildly displaced extra-articular fracture of the distal phalanx of the right thumb. Electronically Signed   By: Richardean Sale M.D.   On: 04/12/2019 11:30   ECHOCARDIOGRAM COMPLETE  Result Date: 04/11/2019    ECHOCARDIOGRAM REPORT   Patient Name:   Megan Salinas Date of Exam: 04/11/2019 Medical Rec #:  [id number]       Height:        64.0 in Accession #:    [id number]      Weight:       154.0 lb Date of Birth:  1926-07-25       BSA:          1.751 m Patient Age:    44 years        BP:           154/55 mmHg Patient Gender: F               HR:           86 bpm. Exam Location:  [facility] Procedure: 2D Echo, Color Doppler and Cardiac Doppler Indications:     I25.10 CAD Native Vessel  History:         Patient has prior history of Echocardiogram examinations. CHF,                  CAD, Prior CABG, TIA; Risk Factors:Hypertension and HCL.  Sonographer:     Charmayne Sheer RDCS (AE) Referring Phys:  [id number] [name] Diagnosing Phys: Yolonda Kida MD  Sonographer Comments: Suboptimal parasternal window. TDS due to inability to position pt due to broken left hip. IMPRESSIONS  1. Left ventricular ejection fraction, by estimation, is 50 to 55%. Left ventricular ejection fraction by 2D MOD biplane is 54.6 %. The left ventricle has low normal function. The left ventricle demonstrates regional wall motion abnormalities (see scoring diagram/findings for description). Left ventricular diastolic parameters are consistent with Grade I diastolic dysfunction (impaired relaxation).  2. Right ventricular systolic function is normal. The right ventricular size is normal.  3. The mitral valve is grossly normal. Trivial mitral valve  regurgitation.  4. The aortic valve is grossly normal. Aortic valve regurgitation is mild to moderate. Mild aortic valve stenosis. FINDINGS  Left Ventricle: Left ventricular ejection fraction, by estimation, is 50 to 55%. Left ventricular ejection fraction by 2D MOD biplane is 54.6 %. The left ventricle has low normal function. The left ventricle demonstrates regional wall motion abnormalities. The left ventricular internal cavity size was normal in size. There is no left ventricular hypertrophy. Left ventricular diastolic parameters are consistent with Grade I diastolic dysfunction (impaired relaxation). Right Ventricle: The right  ventricular size is normal. No increase in right ventricular wall thickness. Right ventricular systolic function is normal. Left Atrium: Left atrial size was normal in size. Right Atrium: Right atrial size was normal in size. Pericardium: There is no evidence of pericardial effusion. Mitral Valve: The mitral valve is grossly normal. Trivial mitral valve regurgitation. MV peak gradient, 9.4 mmHg. The mean mitral valve gradient is 4.0 mmHg. Tricuspid Valve: The tricuspid valve is normal in structure. Tricuspid valve regurgitation is mild. Aortic Valve: The aortic valve is grossly normal. Aortic valve regurgitation is mild to moderate. Aortic regurgitation PHT measures 443 msec. Mild aortic stenosis is present. Aortic valve mean gradient measures 7.0 mmHg. Aortic valve peak gradient measures 11.7 mmHg. Aortic valve area, by VTI measures 1.99 cm. Pulmonic Valve: The pulmonic valve was normal in structure. Pulmonic valve regurgitation is not visualized. Aorta: The aortic root is normal in size and structure. IAS/Shunts: No atrial level shunt detected by color flow Doppler.  LEFT VENTRICLE PLAX 2D                        Biplane EF (MOD) LVIDd:         2.95 cm         LV Biplane EF:   Left LVIDs:         2.60 cm                          ventricular LV PW:         0.86 cm                          ejection LV IVS:        0.82 cm                          fraction by LVOT diam:     2.10 cm                          2D MOD LV SV:         65                               biplane is LV SV Index:   37                               54.6 %. LVOT Area:     3.46 cm                                Diastology  LV e' lateral:   11.50 cm/s LV Volumes (MOD)               LV E/e' lateral: 9.0 LV vol d, MOD    87.3 ml       LV e' medial:    9.46 cm/s A2C:                           LV E/e' medial:  10.9 LV vol d, MOD    109.0 ml A4C: LV vol s, MOD    52.0 ml A2C: LV vol s, MOD    39.2 ml A4C: LV SV MOD A2C:    35.3 ml LV SV MOD A4C:   109.0 ml LV SV MOD BP:    54.6 ml RIGHT VENTRICLE RV Basal diam:  3.49 cm LEFT ATRIUM             Index       RIGHT ATRIUM           Index LA diam:        3.20 cm 1.83 cm/m  RA Area:     17.70 cm LA Vol (A2C):   61.1 ml 34.90 ml/m RA Volume:   44.90 ml  25.65 ml/m LA Vol (A4C):   38.7 ml 22.11 ml/m LA Biplane Vol: 49.6 ml 28.33 ml/m  AORTIC VALVE                    PULMONIC VALVE AV Area (Vmax):    2.17 cm     PV Vmax:       1.02 m/s AV Area (Vmean):   1.96 cm     PV Vmean:      70.600 cm/s AV Area (VTI):     1.99 cm     PV VTI:        0.178 m AV Vmax:           171.00 cm/s  PV Peak grad:  4.2 mmHg AV Vmean:          121.000 cm/s PV Mean grad:  2.0 mmHg AV VTI:            0.326 m AV Peak Grad:      11.7 mmHg AV Mean Grad:      7.0 mmHg LVOT Vmax:         107.00 cm/s LVOT Vmean:        68.400 cm/s LVOT VTI:          0.187 m LVOT/AV VTI ratio: 0.57 AI PHT:            443 msec  AORTA Ao Root diam: 3.40 cm MITRAL VALVE MV Area (PHT): 4.80 cm     SHUNTS MV Peak grad:  9.4 mmHg     Systemic VTI:  0.19 m MV Mean grad:  4.0 mmHg     Systemic Diam: 2.10 cm MV Vmax:       1.53 m/s MV Vmean:      96.4 cm/s MV Decel Time: 158 msec MV E velocity: 103.00 cm/s MV A velocity: 149.00 cm/s MV E/A ratio:  0.69 Dwayne D Callwood MD Electronically signed by Yolonda Kida MD Signature Date/Time: 04/11/2019/12:11:46 PM    Final    DG HIP OPERATIVE UNILAT W OR W/O PELVIS LEFT  Result Date: 04/11/2019 CLINICAL DATA:  Left femur intramedullary rod.  Left hip ORIF. EXAM: OPERATIVE left HIP (WITH PELVIS IF PERFORMED) 5 VIEWS  TECHNIQUE: Fluoroscopic spot image(s) were submitted for interpretation post-operatively. COMPARISON:  Left hip radiographs 04/10/2019 FINDINGS: Intraoperative images demonstrate a left femoral intramedullary rod. 2 screws cross the intratrochanteric fracture. Fracture has been reduced. Alignment is anatomic. IMPRESSION: Left hip ORIF without radiographic evidence for complication.  Electronically Signed   By: San Morelle M.D.   On: 04/11/2019 15:19   DG HIP UNILAT WITH PELVIS 2-3 VIEWS LEFT  Result Date: 04/10/2019 CLINICAL DATA:  Pain EXAM: DG HIP (WITH OR WITHOUT PELVIS) 2-3V LEFT COMPARISON:  None. FINDINGS: There is an acute displaced intratrochanteric fracture of the proximal left femur. Multiple displaced fracture fragments are noted. There is diffuse osteopenia. There are degenerative changes of both hips. There is no dislocation. Vascular calcifications are noted. IMPRESSION: Acute displaced intratrochanteric fracture of the proximal left femur. Electronically Signed   By: Constance Holster M.D.   On: 04/10/2019 21:53    Time Spent in minutes 25   Norina Cowper M.D on 04/12/2019 at 2:03 PM  Between 7am to 7pm - Pager - [phone]  After 7pm go to [email or web address] - password [facility]  [facility] -  Office  [phone]

## 2019-04-12 NOTE — Progress Notes (Signed)
  Subjective: 1 Day Post-Op Procedure(s) (LRB): INTRAMEDULLARY (IM) NAIL INTERTROCHANTRIC (Left) Patient reports pain as moderate.   Patient is well, and has had no acute complaints or problems Plan is to go Rehab after hospital stay. Negative for chest pain and shortness of breath Fever: no Gastrointestinal: Negative for nausea and vomiting  Objective: Vital signs in last 24 hours: Temp:  [97 F (36.1 C)-99 F (37.2 C)] 98.2 F (36.8 C) (03/30 0420) Pulse Rate:  [40-92] 90 (03/30 0420) Resp:  [11-20] 18 (03/30 0420) BP: (106-154)/(41-92) 135/59 (03/30 0420) SpO2:  [92 %-100 %] 97 % (03/30 0420)  Intake/Output from previous day:  Intake/Output Summary (Last 24 hours) at 04/12/2019 0654 Last data filed at 04/11/2019 1617 Gross per 24 hour  Intake 150 ml  Output 340 ml  Net -190 ml    Intake/Output this shift: No intake/output data recorded.  Labs: Recent Labs    04/10/19 2245  HGB 12.4   Recent Labs    04/10/19 2245  WBC 11.0*  RBC 3.97  HCT 37.1  PLT 231   Recent Labs    04/10/19 2245  NA 137  K 3.8  CL 106  CO2 22  BUN 13  CREATININE 0.66  GLUCOSE 177*  CALCIUM 9.6   Recent Labs    04/10/19 2245  INR 1.1     EXAM General - Patient is Alert and Oriented Extremity - Neurovascular intact Sensation intact distally Dorsiflexion/Plantar flexion intact Compartment soft Dressing/Incision - clean, dry, no drainage Motor Function - intact, moving foot and toes well on exam.   Past Medical History:  Diagnosis Date  . Back pain   . CAD (coronary artery disease)   . CHF (congestive heart failure) ([place])   . GERD (gastroesophageal reflux disease)   . High cholesterol   . Hypertension   . TIA (transient ischemic attack) 01/2016   "possible", no deficits  . Wears hearing aid    bilateral    Assessment/Plan: 1 Day Post-Op Procedure(s) (LRB): INTRAMEDULLARY (IM) NAIL INTERTROCHANTRIC (Left) Principal Problem:   Displaced intertrochanteric  fracture of left femur (HCC) Active Problems:   Chronic systolic CHF (congestive heart failure), NYHA class 3 (HCC)   Coronary artery disease   Type 2 diabetes mellitus without complication (HCC)   Closed left hip fracture, initial encounter ([place])   Preop examination  Estimated body mass index is 26.43 kg/m as calculated from the following:   Height as of 08/27/18: 5\' 4"  (1.626 m).   Weight as of 08/27/18: 69.9 kg. Advance diet Up with therapy D/C IV fluids Discharge to SNF per medicine. Follow-up at clinical clinic in 2 weeks for staple removal.  DVT Prophylaxis - Lovenox, Foot Pumps and TED hose Weight-Bearing as tolerated to left leg  Reche Dixon, PA-C Orthopaedic Surgery 04/12/2019, 6:54 AM

## 2019-04-12 NOTE — Progress Notes (Signed)
[facility]  Patient Description: Megan Salinas is a 86 year old Caucasian female with PMH significant for CAD s/p CABG (0000000), chronic systolic heart failure with an EF of 45%-50%, hypertension, hyperlipidemia, DM Type II, TIA, GERD and back pain who suffered a fall in her garage on 04/10/2019 and due to the patient's extensive cardiac history, cardiology was consulted for pre-operative clearance. The patient    underwent Intramedullary nailing of the left femur on 04/12/19.  SUBJECTIVE: The patient reports to be feeling fairly well but complains of fatigue and intermittent muscle spasms in the left leg.  The patient denies any shortness of breath, chest pain, palpitations, unilateral weakness, dizziness or syncope at this time.  OBJECTIVE: The patient appears to be doing fairly well and is resting comfortably in bed.  Echocardiogram was obtained on yesterday and it revealed some improvement with an EF of 50-55%, otherwise findings are fairly consistent with previous 2019 study. The patient is noted to have +2 radial pulses bilaterally and +2 pedal pulses bilaterally.  The patient brought to my attention her right thumb which appears to be severely edematous with signs of cyanosis.   Vitals:   04/11/19 2126 04/11/19 2230 04/12/19 0420 04/12/19 0744  BP: (!) 118/41 (!) 134/59 (!) 135/59 (!) 154/67  Pulse: 90 92 90 90  Resp: 17 17 18 17   Temp: 98.4 F (36.9 C) 98.4 F (36.9 C) 98.2 F (36.8 C) 98.3 F (36.8 C)  TempSrc: Oral Oral Oral Oral  SpO2: 95% 95% 97% 93%     Intake/Output Summary (Last 24 hours) at 04/12/2019 [id number] Last data filed at 04/11/2019 1617 Gross per 24 hour  Intake 150 ml  Output 340 ml  Net -190 ml    PHYSICAL EXAM  General: Well developed, well nourished, in no acute distress HEENT:  Normocephalic and atraumatic Neck:  No JVD.  Lungs: Clear bilaterally to auscultation  Heart: HRRR . Normal S1 and S2 without gallops. Soft murmur  Abdomen: Bowel sounds are positive,  abdomen soft and non-tender  Msk:  Normal strength in BUE for age. Diminished strength in LLE. Normal strength for age in RLE. Extremities: mild, non-pitting edema in BLE. No clubbing. Right thumb is edematous with signs of cyanosis and bruising.    Neuro: Alert and oriented X 3. Psych:  Good affect, responds appropriately   LABS: Basic Metabolic Panel: Recent Labs    04/10/19 2245 04/12/19 0534  NA 137 137  K 3.8 4.4  CL 106 108  CO2 22 21*  GLUCOSE 177* 185*  BUN 13 13  CREATININE 0.66 0.62  CALCIUM 9.6 9.0   Liver Function Tests: No results for input(s): AST, ALT, ALKPHOS, BILITOT, PROT, ALBUMIN in the last 72 hours. No results for input(s): LIPASE, AMYLASE in the last 72 hours. CBC: Recent Labs    04/10/19 2245 04/12/19 0534  WBC 11.0* 9.6  NEUTROABS 8.6*  --   HGB 12.4 9.9*  HCT 37.1 29.6*  MCV 93.5 93.7  PLT 231 183   Cardiac Enzymes: No results for input(s): CKTOTAL, CKMB, CKMBINDEX, TROPONINI in the last 72 hours. BNP: Invalid input(s): POCBNP D-Dimer: No results for input(s): DDIMER in the last 72 hours. Hemoglobin A1C: Recent Labs    04/10/19 2245  HGBA1C 6.8*   Fasting Lipid Panel: No results for input(s): CHOL, HDL, LDLCALC, TRIG, CHOLHDL, LDLDIRECT in the last 72 hours. Thyroid Function Tests: No results for input(s): TSH, T4TOTAL, T3FREE, THYROIDAB in the last 72 hours.  Invalid input(s): FREET3 Anemia Panel: No results for  input(s): VITAMINB12, FOLATE, FERRITIN, TIBC, IRON, RETICCTPCT in the last 72 hours.  CT Head Wo Contrast  Result Date: 04/10/2019 CLINICAL DATA:  Recent fall with headaches and neck pain, initial encounter EXAM: CT HEAD WITHOUT CONTRAST CT CERVICAL SPINE WITHOUT CONTRAST TECHNIQUE: Multidetector CT imaging of the head and cervical spine was performed following the standard protocol without intravenous contrast. Multiplanar CT image reconstructions of the cervical spine were also generated. COMPARISON:  01/28/2016,  01/14/2018 FINDINGS: CT HEAD FINDINGS Brain: No evidence of acute infarction, hemorrhage, hydrocephalus, extra-axial collection or mass lesion/mass effect. Chronic atrophic and ischemic changes are again noted and stable. Vascular: No hyperdense vessel or unexpected calcification. Skull: Normal. Negative for fracture or focal lesion. Sinuses/Orbits: Mucosal retention cyst is noted in the left maxillary antrum stable from the prior exams. The paranasal sinuses are otherwise within normal limits. Mastoid air cells on the left demonstrate opacification. This is stable from the prior exam of 2020. Other: None. CT CERVICAL SPINE FINDINGS Alignment: Within normal limits. Skull base and vertebrae: 7 cervical segments are well visualized. Vertebral body height is well maintained. Multilevel facet hypertrophic changes and osteophytic changes are seen. No acute fracture or acute facet abnormality is noted. Neural foraminal narrowing is noted bilaterally. Soft tissues and spinal canal: Scattered calcifications and hypodense nodular changes are noted throughout the thyroid. None of these are significant by size criteria. The remaining soft tissues of the neck are within normal limits. Upper chest: Visualized lung apices are within normal limits. Other: None IMPRESSION: CT of the head: Chronic atrophic and ischemic changes without acute intracranial abnormality. Chronic left mastoid opacification CT of the cervical spine: Multilevel degenerative changes without acute abnormality. Multiple small hypodensities and calcifications noted throughout the thyroid. No follow-up recommended unless clinically warranted (ref: J Am Coll Radiol. 2015 Feb;12(2): 143-50). Electronically Signed   By: Inez Catalina M.D.   On: 04/10/2019 21:35   CT Cervical Spine Wo Contrast  Result Date: 04/10/2019 CLINICAL DATA:  Recent fall with headaches and neck pain, initial encounter EXAM: CT HEAD WITHOUT CONTRAST CT CERVICAL SPINE WITHOUT CONTRAST  TECHNIQUE: Multidetector CT imaging of the head and cervical spine was performed following the standard protocol without intravenous contrast. Multiplanar CT image reconstructions of the cervical spine were also generated. COMPARISON:  01/28/2016, 01/14/2018 FINDINGS: CT HEAD FINDINGS Brain: No evidence of acute infarction, hemorrhage, hydrocephalus, extra-axial collection or mass lesion/mass effect. Chronic atrophic and ischemic changes are again noted and stable. Vascular: No hyperdense vessel or unexpected calcification. Skull: Normal. Negative for fracture or focal lesion. Sinuses/Orbits: Mucosal retention cyst is noted in the left maxillary antrum stable from the prior exams. The paranasal sinuses are otherwise within normal limits. Mastoid air cells on the left demonstrate opacification. This is stable from the prior exam of 2020. Other: None. CT CERVICAL SPINE FINDINGS Alignment: Within normal limits. Skull base and vertebrae: 7 cervical segments are well visualized. Vertebral body height is well maintained. Multilevel facet hypertrophic changes and osteophytic changes are seen. No acute fracture or acute facet abnormality is noted. Neural foraminal narrowing is noted bilaterally. Soft tissues and spinal canal: Scattered calcifications and hypodense nodular changes are noted throughout the thyroid. None of these are significant by size criteria. The remaining soft tissues of the neck are within normal limits. Upper chest: Visualized lung apices are within normal limits. Other: None IMPRESSION: CT of the head: Chronic atrophic and ischemic changes without acute intracranial abnormality. Chronic left mastoid opacification CT of the cervical spine: Multilevel degenerative changes without acute  abnormality. Multiple small hypodensities and calcifications noted throughout the thyroid. No follow-up recommended unless clinically warranted (ref: J Am Coll Radiol. 2015 Feb;12(2): 143-50). Electronically Signed   By:  Inez Catalina M.D.   On: 04/10/2019 21:35   DG Chest Port 1 View  Result Date: 04/10/2019 CLINICAL DATA:  Preop EXAM: PORTABLE CHEST 1 VIEW COMPARISON:  02/28/2017 FINDINGS: Increased interstitial markings. This appearance favors chronic interstitial lung disease over interstitial edema or mild pneumonia. Mild right lower lung opacity favors atelectasis. No pleural effusion or pneumothorax. Cardiomegaly. Postsurgical changes related to prior CABG. Thoracic aortic atherosclerosis. Degenerative changes of the bilateral shoulders. IMPRESSION: Increased interstitial markings, favoring chronic interstitial lung disease, with mild right lower lobe atelectasis. Electronically Signed   By: Julian Hy M.D.   On: 04/10/2019 22:28   ECHOCARDIOGRAM COMPLETE  Result Date: 04/11/2019    ECHOCARDIOGRAM REPORT   Patient Name:   Megan Salinas Date of Exam: 04/11/2019 Medical Rec #:  [id number]       Height:       64.0 in Accession #:    [id number]      Weight:       154.0 lb Date of Birth:  25-Jul-1926       BSA:          1.751 m Patient Age:    29 years        BP:           154/55 mmHg Patient Gender: F               HR:           86 bpm. Exam Location:  [facility] Procedure: 2D Echo, Color Doppler and Cardiac Doppler Indications:     I25.10 CAD Native Vessel  History:         Patient has prior history of Echocardiogram examinations. CHF,                  CAD, Prior CABG, TIA; Risk Factors:Hypertension and HCL.  Sonographer:     Charmayne Sheer RDCS (AE) Referring Phys:  [id number] [name] Diagnosing Phys: Yolonda Kida MD  Sonographer Comments: Suboptimal parasternal window. TDS due to inability to position pt due to broken left hip. IMPRESSIONS  1. Left ventricular ejection fraction, by estimation, is 50 to 55%. Left ventricular ejection fraction by 2D MOD biplane is 54.6 %. The left ventricle has low normal function. The left ventricle demonstrates regional wall motion abnormalities (see scoring diagram/findings for  description). Left ventricular diastolic parameters are consistent with Grade I diastolic dysfunction (impaired relaxation).  2. Right ventricular systolic function is normal. The right ventricular size is normal.  3. The mitral valve is grossly normal. Trivial mitral valve regurgitation.  4. The aortic valve is grossly normal. Aortic valve regurgitation is mild to moderate. Mild aortic valve stenosis. FINDINGS  Left Ventricle: Left ventricular ejection fraction, by estimation, is 50 to 55%. Left ventricular ejection fraction by 2D MOD biplane is 54.6 %. The left ventricle has low normal function. The left ventricle demonstrates regional wall motion abnormalities. The left ventricular internal cavity size was normal in size. There is no left ventricular hypertrophy. Left ventricular diastolic parameters are consistent with Grade I diastolic dysfunction (impaired relaxation). Right Ventricle: The right ventricular size is normal. No increase in right ventricular wall thickness. Right ventricular systolic function is normal. Left Atrium: Left atrial size was normal in size. Right Atrium: Right atrial size was normal in size. Pericardium: There  is no evidence of pericardial effusion. Mitral Valve: The mitral valve is grossly normal. Trivial mitral valve regurgitation. MV peak gradient, 9.4 mmHg. The mean mitral valve gradient is 4.0 mmHg. Tricuspid Valve: The tricuspid valve is normal in structure. Tricuspid valve regurgitation is mild. Aortic Valve: The aortic valve is grossly normal. Aortic valve regurgitation is mild to moderate. Aortic regurgitation PHT measures 443 msec. Mild aortic stenosis is present. Aortic valve mean gradient measures 7.0 mmHg. Aortic valve peak gradient measures 11.7 mmHg. Aortic valve area, by VTI measures 1.99 cm. Pulmonic Valve: The pulmonic valve was normal in structure. Pulmonic valve regurgitation is not visualized. Aorta: The aortic root is normal in size and structure. IAS/Shunts: No  atrial level shunt detected by color flow Doppler.  LEFT VENTRICLE PLAX 2D                        Biplane EF (MOD) LVIDd:         2.95 cm         LV Biplane EF:   Left LVIDs:         2.60 cm                          ventricular LV PW:         0.86 cm                          ejection LV IVS:        0.82 cm                          fraction by LVOT diam:     2.10 cm                          2D MOD LV SV:         65                               biplane is LV SV Index:   37                               54.6 %. LVOT Area:     3.46 cm                                Diastology                                LV e' lateral:   11.50 cm/s LV Volumes (MOD)               LV E/e' lateral: 9.0 LV vol d, MOD    87.3 ml       LV e' medial:    9.46 cm/s A2C:                           LV E/e' medial:  10.9 LV vol d, MOD    109.0 ml A4C: LV vol s, MOD    52.0 ml A2C: LV vol s, MOD    39.2 ml A4C: LV SV  MOD A2C:   35.3 ml LV SV MOD A4C:   109.0 ml LV SV MOD BP:    54.6 ml RIGHT VENTRICLE RV Basal diam:  3.49 cm LEFT ATRIUM             Index       RIGHT ATRIUM           Index LA diam:        3.20 cm 1.83 cm/m  RA Area:     17.70 cm LA Vol (A2C):   61.1 ml 34.90 ml/m RA Volume:   44.90 ml  25.65 ml/m LA Vol (A4C):   38.7 ml 22.11 ml/m LA Biplane Vol: 49.6 ml 28.33 ml/m  AORTIC VALVE                    PULMONIC VALVE AV Area (Vmax):    2.17 cm     PV Vmax:       1.02 m/s AV Area (Vmean):   1.96 cm     PV Vmean:      70.600 cm/s AV Area (VTI):     1.99 cm     PV VTI:        0.178 m AV Vmax:           171.00 cm/s  PV Peak grad:  4.2 mmHg AV Vmean:          121.000 cm/s PV Mean grad:  2.0 mmHg AV VTI:            0.326 m AV Peak Grad:      11.7 mmHg AV Mean Grad:      7.0 mmHg LVOT Vmax:         107.00 cm/s LVOT Vmean:        68.400 cm/s LVOT VTI:          0.187 m LVOT/AV VTI ratio: 0.57 AI PHT:            443 msec  AORTA Ao Root diam: 3.40 cm MITRAL VALVE MV Area (PHT): 4.80 cm     SHUNTS MV Peak grad:  9.4 mmHg     Systemic  VTI:  0.19 m MV Mean grad:  4.0 mmHg     Systemic Diam: 2.10 cm MV Vmax:       1.53 m/s MV Vmean:      96.4 cm/s MV Decel Time: 158 msec MV E velocity: 103.00 cm/s MV A velocity: 149.00 cm/s MV E/A ratio:  0.69 Dwayne D Callwood MD Electronically signed by Yolonda Kida MD Signature Date/Time: 04/11/2019/12:11:46 PM    Final    DG HIP OPERATIVE UNILAT W OR W/O PELVIS LEFT  Result Date: 04/11/2019 CLINICAL DATA:  Left femur intramedullary rod.  Left hip ORIF. EXAM: OPERATIVE left HIP (WITH PELVIS IF PERFORMED) 5 VIEWS TECHNIQUE: Fluoroscopic spot image(s) were submitted for interpretation post-operatively. COMPARISON:  Left hip radiographs 04/10/2019 FINDINGS: Intraoperative images demonstrate a left femoral intramedullary rod. 2 screws cross the intratrochanteric fracture. Fracture has been reduced. Alignment is anatomic. IMPRESSION: Left hip ORIF without radiographic evidence for complication. Electronically Signed   By: San Morelle M.D.   On: 04/11/2019 15:19   DG HIP UNILAT WITH PELVIS 2-3 VIEWS LEFT  Result Date: 04/10/2019 CLINICAL DATA:  Pain EXAM: DG HIP (WITH OR WITHOUT PELVIS) 2-3V LEFT COMPARISON:  None. FINDINGS: There is an acute displaced intratrochanteric fracture of the proximal left femur. Multiple displaced fracture fragments are noted. There is diffuse osteopenia. There are  degenerative changes of both hips. There is no dislocation. Vascular calcifications are noted. IMPRESSION: Acute displaced intratrochanteric fracture of the proximal left femur. Electronically Signed   By: Constance Holster M.D.   On: 04/10/2019 21:53     Echo: Low normal LV function with a EF estimated to be 50-55%.  Normal right ventricular systolic function.  Trivial mitral valve regurgitation.  Mild to moderate aortic valve regurgitation and mild aortic valve stenosis.  TELEMETRY: SR. HR=77 bpm   Principal Problem:   Displaced intertrochanteric fracture of left femur (HCC) Active Problems:    Chronic systolic CHF (congestive heart failure), NYHA class 3 (HCC)   Coronary artery disease   Type 2 diabetes mellitus without complication (HCC)   Closed left hip fracture, initial encounter ([place])   Preop examination  Right thumb edema   ASSESSMENT AND PLAN:  1. Chronic systolic heart failure, most recent EF= 50-55%, reasonably stable             -Continue Metoprolol and spironolactone              2. CAD (3 vessel) s/p CABG (2012), reasonably stable             -Recommend continuing statin and ASA post-surgery             -Recommend continuous telemetry monitoring  3. HTN, fairly controlled             -Continue Lisinopril   4. DM Type II, failry controlled, pt is hyperglycemic today             -Agree with current management with sliding scale insulin  5. HLD, reasonably controlled             -Continue simvastatin  6. Displaced intertochanteric fracture of the left femur due to accidental fall             - Managed by Orthopedic Surgery  -Pain management per Orthopedic Surgery   7. Right thumb edema with signs of cyanosis, unstable  -Ordered a STAT CXR of right thumb   -Orthopedic Surgery was notified     Keauna Brasel, ACNPC-AG  04/12/2019 9:38 AM

## 2019-04-12 NOTE — Evaluation (Signed)
Occupational Therapy Evaluation Patient Details Name: Megan Salinas MRN: [id number] DOB: 1926/07/28 Today's Date: 04/12/2019    History of Present Illness Pt is a 86 y/o F s/p L hip IM nail (04/11/2019) following a left intertrochanteric hip fracture from an accidental fall at home on 04/10/2019. PMH includes: CAD s/p CAGB, CHF, DM, TIA, HLD, HTN, and GERD   Clinical Impression   Patient returning supine upon entry with PT present, ending session.  Daughter at bedside for duration of evaluation.  Patient agreeable to further therapy.  Requested pain medication per pt; provided education to pt and daughter that pain medication is as requested and therapy will work with schedule.  Provided extensive education to pt and daughter on use of compensatory techniques and adaptive aides to improve independence within the home (e.g. regarding hearing deficits).  Patient requires ~MOD A for functional transfers at this time secondary to lethargy from previous therapy.  Patient presents with decreased strength and activity tolerance affecting overall independence level.  Patient would benefit from continued skilled occupational therapy to address deficits in performance components outlined below.  Based on today's performance, recommending SNF as follow up.  Patient and daughter agreeable to this.    Follow Up Recommendations  SNF    Equipment Recommendations  3 in 1 bedside commode    Recommendations for Other Services       Precautions / Restrictions Precautions Precautions: Fall Restrictions Weight Bearing Restrictions: Yes LLE Weight Bearing: Weight bearing as tolerated      Mobility Bed Mobility    General bed mobility comments: MOD A per PT eval  Transfers Overall transfer level: Needs assistance Equipment used: Rolling walker (2 wheeled) Transfers: Sit to/from Stand Sit to Stand: Mod assist         General transfer comment: Requires cues for hand placement, body mechanics and  overall sequencing    Balance                            ADL either performed or assessed with clinical judgement   ADL Overall ADL's : Needs assistance/impaired     Grooming: Wash/dry hands;Wash/dry face;Bed level;Set up;Supervision/safety           Upper Body Dressing : Min guard;Sitting Upper Body Dressing Details (indicate cue type and reason): cues for safety regarding IV site Lower Body Dressing: Maximal assistance;Sit to/from stand Lower Body Dressing Details (indicate cue type and reason): secondary to pain Toilet Transfer: Moderate assistance;RW;Stand-pivot             General ADL Comments: Requires extra time secondary to pain, but patient is motivated to participate     Vision Patient Visual Report: No change from baseline       Perception     Praxis      Pertinent Vitals/Pain Pain Assessment: Faces Pain Score: 8 (8/10 in L hip, 10/10 with muscle spasms) Faces Pain Scale: Hurts even more Pain Location: L Hip.  Patient unable to rate pain, but noted pain behaviors with movement. Pain Descriptors / Indicators: Aching;Sore;Moaning;Spasm;Grimacing Pain Intervention(s): Limited activity within patient's tolerance;Monitored during session;RN gave pain meds during session;Repositioned     Hand Dominance Right   Extremity/Trunk Assessment Upper Extremity Assessment Upper Extremity Assessment: Generalized weakness   Lower Extremity Assessment Lower Extremity Assessment: Defer to PT evaluation       Communication Communication Communication: HOH(pt has hearing aids but no batteries)   Cognition Arousal/Alertness: Awake/alert Behavior During Therapy: [facility] for  tasks assessed/performed Overall Cognitive Status: Within Functional Limits for tasks assessed                                     General Comments       Exercises  Other Exercises Other Exercises: Provided education on goals and role of OT in acute care  setting Other Exercises: Provided general safety education on use of telephone, call light, bed controls, etc Other Exercises: Provided education on use of EC techniques and compensatory techniques to improve independence upon return home   Shoulder Instructions      Home Living Family/patient expects to be discharged to:: Private residence Living Arrangements: Alone Available Help at Discharge: Family;Available PRN/intermittently(Daughter is able to visit weekly) Type of Home: Independent living facility Home Access: Level entry     Home Layout: One level     Bathroom Shower/Tub: Occupational psychologist: Standard     Home Equipment: Grab bars - toilet;Walker - 4 wheels;Walker - standard;Grab bars - tub/shower;Tub bench          Prior Functioning/Environment Level of Independence: Independent with assistive device(s)        Comments: Patient was MOD I with (B)ADLs and performed functional mobility using rollator.        OT Problem List: Decreased strength;Decreased activity tolerance;Decreased safety awareness;Decreased knowledge of precautions;Decreased knowledge of use of DME or AE      OT Treatment/Interventions: Self-care/ADL training;Therapeutic exercise;Energy conservation;DME and/or AE instruction;Therapeutic activities;Patient/family education    OT Goals(Current goals can be found in the care plan section) Acute Rehab OT Goals Patient Stated Goal: "I want to be like I was before all this" OT Goal Formulation: With patient Time For Goal Achievement: 04/26/19 Potential to Achieve Goals: Good  OT Frequency: Min 1X/week   Barriers to D/C: Decreased caregiver support  Patient lives alone in [place].       Co-evaluation              AM-PAC OT "6 Clicks" Daily Activity     Outcome Measure Help from another person eating meals?: None Help from another person taking care of personal grooming?: None Help from another person toileting, which  includes using toliet, bedpan, or urinal?: A Lot Help from another person bathing (including washing, rinsing, drying)?: A Lot Help from another person to put on and taking off regular upper body clothing?: None Help from another person to put on and taking off regular lower body clothing?: A Lot 6 Click Score: 18   End of Session Nurse Communication: Patient requests pain meds;Other (comment)(Patient needed purewic replaced)  Activity Tolerance: Patient tolerated treatment well Patient left: in bed;with call bell/phone within reach;with bed alarm set;with family/visitor present  OT Visit Diagnosis: Unsteadiness on feet (R26.81);Muscle weakness (generalized) (M62.81);History of falling (Z91.81)                Time: [id number] OT Time Calculation (min): 31 min Charges:  OT General Charges $OT Visit: 1 Visit OT Evaluation $OT Eval Moderate Complexity: 1 Mod OT Treatments $Self Care/Home Management : 23-37 mins  Baldomero Lamy, MS, OTR/L 04/12/19, 4:53 PM

## 2019-04-12 NOTE — NC FL2 (Signed)
[place] LEVEL OF CARE SCREENING TOOL     IDENTIFICATION  Patient Name: Megan Salinas Birthdate: 08-May-1926 Sex: female Admission Date (Current Location): 04/10/2019  [place] and [place] Number:  Engineering geologist and Address:  [facility], [address], [place], [place] [address]      Provider Number: [id number]  Attending Physician Name and Address:  Louellen Molder, MD  Relative Name and Phone Number:       Current Level of Care: Hospital Recommended Level of Care: [place] Prior Approval Number:    Date Approved/Denied:   PASRR Number: [id number]  Discharge Plan: SNF    Current Diagnoses: Patient Active Problem List   Diagnosis Date Noted  . Displaced intertrochanteric fracture of left femur ([place]) 04/10/2019  . Type 2 diabetes mellitus without complication ([place]) [id number]  . Closed left hip fracture, initial encounter ([place]) 04/10/2019  . Preop examination 04/10/2019  . Lymphedema 09/05/2018  . Primary osteoarthritis of right wrist 02/22/2018  . Chest pain 02/28/2017  . Venous insufficiency of right lower extremity 09/02/2016  . Swelling of limb 08/15/2016  . Frequent PVCs 03/12/2016  . Transient global amnesia 02/26/2016  . TIA (transient ischemic attack) 01/28/2016  . Carpal tunnel syndrome, right 08/13/2015  . DDD (degenerative disc disease), lumbar 09/12/2014  . Lumbar radiculitis 09/12/2014  . Lumbar stenosis with neurogenic claudication 09/12/2014  . GERD (gastroesophageal reflux disease) 09/08/2014  . History of type 2 diabetes mellitus 09/08/2014  . Benign essential hypertension 06/16/2014  . Moderate tricuspid insufficiency 06/07/2014  . Abdominal pain, diffuse 03/09/2014  . Chronic systolic CHF (congestive heart failure), NYHA class 3 ([place]) 12/05/2013  . Moderate mitral insufficiency 12/05/2013  . Coronary artery disease 03/29/2013  . Mixed hyperlipidemia 03/29/2013    Orientation  RESPIRATION BLADDER Height & Weight     Self, Time, Situation, Place  Normal External catheter Weight:   Height:     BEHAVIORAL SYMPTOMS/MOOD NEUROLOGICAL BOWEL NUTRITION STATUS      Continent Diet(Regular)  AMBULATORY STATUS COMMUNICATION OF NEEDS Skin   Extensive Assist Verbally Surgical wounds                       Personal Care Assistance Level of Assistance  Bathing, Dressing, Feeding Bathing Assistance: Maximum assistance Feeding assistance: Maximum assistance Dressing Assistance: Maximum assistance     Functional Limitations Info             SPECIAL CARE FACTORS FREQUENCY  PT (By licensed PT), OT (By licensed OT)                    Contractures Contractures Info: Not present    Additional Factors Info  Code Status, Allergies Code Status Info: Full Allergies Info: Sulfa           Current Medications (04/12/2019):  This is the current hospital active medication list Current Facility-Administered Medications  Medication Dose Route Frequency Provider Last Rate Last Admin  . acetaminophen (TYLENOL) tablet 1,000 mg  1,000 mg Oral Q8H Leim Fabry, MD   1,000 mg at 04/12/19 [id number]  . acetaminophen (TYLENOL) tablet 325-650 mg  325-650 mg Oral Q6H PRN Leim Fabry, MD      . bisacodyl (DULCOLAX) suppository 10 mg  10 mg Rectal Daily PRN Leim Fabry, MD      . ceFAZolin (ANCEF) IVPB 1 g/50 mL premix  1 g Intravenous Q6H Leim Fabry, MD   Stopped at 04/12/19 [phone]  . docusate  sodium (COLACE) capsule 100 mg  100 mg Oral BID Leim Fabry, MD   100 mg at 04/12/19 0926  . enoxaparin (LOVENOX) injection 30 mg  30 mg Subcutaneous Q24H Leim Fabry, MD   30 mg at 04/12/19 [id number]  . famotidine (PEPCID) tablet 20 mg  20 mg Oral Daily Leim Fabry, MD   20 mg at 04/12/19 0926  . HYDROmorphone (DILAUDID) injection 0.25-0.5 mg  0.25-0.5 mg Intravenous Q2H PRN Leim Fabry, MD      . insulin aspart (novoLOG) injection 0-15 Units  0-15 Units Subcutaneous TID WC Leim Fabry,  MD   3 Units at 04/12/19 1251  . lisinopril (ZESTRIL) tablet 20 mg  20 mg Oral Daily Leim Fabry, MD   20 mg at 04/12/19 [id number]  . methocarbamol (ROBAXIN) 500 mg in dextrose 5 % 50 mL IVPB  500 mg Intravenous Q6H PRN Leim Fabry, MD 100 mL/hr at 04/12/19 1256 500 mg at 04/12/19 1256  . metoCLOPramide (REGLAN) tablet 5-10 mg  5-10 mg Oral Q8H PRN Leim Fabry, MD       Or  . metoCLOPramide (REGLAN) injection 5-10 mg  5-10 mg Intravenous Q8H PRN Leim Fabry, MD      . metoprolol tartrate (LOPRESSOR) tablet 12.5 mg  12.5 mg Oral BID Leim Fabry, MD   12.5 mg at 04/12/19 [id number]  . multivitamin with minerals tablet 1 tablet  1 tablet Oral Daily Dhungel, Nishant, MD      . mupirocin ointment (BACTROBAN) 2 % 1 application  1 application Nasal BID Leim Fabry, MD   1 application at [id number] [phone]  . ondansetron (ZOFRAN) tablet 4 mg  4 mg Oral Q6H PRN Leim Fabry, MD       Or  . ondansetron [facility]) injection 4 mg  4 mg Intravenous Q6H PRN Leim Fabry, MD      . oxyCODONE (Oxy IR/ROXICODONE) immediate release tablet 2.5-5 mg  2.5-5 mg Oral Q3H PRN Leim Fabry, MD      . oxyCODONE (Oxy IR/ROXICODONE) immediate release tablet 5-10 mg  5-10 mg Oral Q4H PRN Leim Fabry, MD      . pantoprazole (PROTONIX) EC tablet 40 mg  40 mg Oral BID Leim Fabry, MD   40 mg at 04/12/19 [id number]  . protein supplement (ENSURE MAX) liquid  11 oz Oral BID BM Dhungel, Nishant, MD   11 oz at 04/12/19 0929  . senna-docusate (Senokot-S) tablet 1 tablet  1 tablet Oral QHS PRN Leim Fabry, MD      . senna-docusate (Senokot-S) tablet 1 tablet  1 tablet Oral QHS PRN Leim Fabry, MD      . simvastatin (ZOCOR) tablet 40 mg  40 mg Oral q1800 Leim Fabry, MD   40 mg at 04/11/19 2238  . sodium phosphate (FLEET) 7-19 GM/118ML enema 1 enema  1 enema Rectal Once PRN Leim Fabry, MD      . spironolactone (ALDACTONE) tablet 25 mg  25 mg Oral Daily Leim Fabry, MD   25 mg at 04/12/19 [id number]  . traMADol (ULTRAM) tablet 50 mg  50 mg Oral Q6H  PRN Leim Fabry, MD   50 mg at 04/12/19 [id number]     Discharge Medications: Please see discharge summary for a list of discharge medications.  Relevant Imaging Results:  Relevant Lab Results:   Additional Information SS# [id number]  Shelbie Ammons, RN

## 2019-04-12 NOTE — TOC Initial Note (Signed)
Transition of Care [facility]) - Initial/Assessment Note    Patient Details  Name: Megan Salinas MRN: [id number] Date of Birth: 01/12/1927  [facility] CM/SW Contact:    Shelbie Ammons, RN Phone Number: 04/12/2019, 1:55 PM  Clinical Narrative:    RNCM assessed patient at bedside, daughter present and patient completing PT eval. Patient is from [facility] independent cottages and plan is for her to go to the rehab side at discharge. Discussed with daughter that patient is normally independent however they have been discussing that she may likely need to have some higher level services when she leaves the SNF. Discussed that the SW at the facility would be able to help make those arrangements. RNCM verified PASSR, completed FL2 and sent bed request to [facility]. RNCM will continue to follow for any further needs.                    Patient Goals and CMS Choice        Expected Discharge Plan and Services                                                Prior Living Arrangements/Services                       Activities of Daily Living Home Assistive Devices/Equipment: Gilford Rile (specify type) ADL Screening (condition at time of admission) Patient's cognitive ability adequate to safely complete daily activities?: Yes Is the patient deaf or have difficulty hearing?: Yes Does the patient have difficulty seeing, even when wearing glasses/contacts?: No Does the patient have difficulty concentrating, remembering, or making decisions?: No Patient able to express need for assistance with ADLs?: Yes Does the patient have difficulty dressing or bathing?: No Independently performs ADLs?: Yes (appropriate for developmental age) Does the patient have difficulty walking or climbing stairs?: Yes Weakness of Legs: Both Weakness of Arms/Hands: None  Permission Sought/Granted                  Emotional Assessment              Admission diagnosis:  Fall  [W19.XXXA] Closed fracture of left hip, initial encounter ([place]) [S72.002A] Fall, initial encounter [W19.XXXA] Closed left hip fracture, initial encounter [facility]) [S72.002A] Patient Active Problem List   Diagnosis Date Noted  . Displaced intertrochanteric fracture of left femur ([place]) 04/10/2019  . Type 2 diabetes mellitus without complication ([place]) [id number]  . Closed left hip fracture, initial encounter ([place]) 04/10/2019  . Preop examination 04/10/2019  . Lymphedema 09/05/2018  . Primary osteoarthritis of right wrist 02/22/2018  . Chest pain 02/28/2017  . Venous insufficiency of right lower extremity 09/02/2016  . Swelling of limb 08/15/2016  . Frequent PVCs 03/12/2016  . Transient global amnesia 02/26/2016  . TIA (transient ischemic attack) 01/28/2016  . Carpal tunnel syndrome, right 08/13/2015  . DDD (degenerative disc disease), lumbar 09/12/2014  . Lumbar radiculitis 09/12/2014  . Lumbar stenosis with neurogenic claudication 09/12/2014  . GERD (gastroesophageal reflux disease) 09/08/2014  . History of type 2 diabetes mellitus 09/08/2014  . Benign essential hypertension 06/16/2014  . Moderate tricuspid insufficiency 06/07/2014  . Abdominal pain, diffuse 03/09/2014  . Chronic systolic CHF (congestive heart failure), NYHA class 3 ([place]) 12/05/2013  . Moderate mitral insufficiency 12/05/2013  . Coronary artery disease 03/29/2013  . Mixed hyperlipidemia  03/29/2013   PCP:  [name], MD Pharmacy:   [facility] [id number], [place] [address] Phone: [phone] Fax: [phone]  [place], [place] [address] Phone: [phone] Fax: [phone]     Social Determinants of Health (SDOH) Interventions    Readmission Risk Interventions No flowsheet data found.

## 2019-04-13 DIAGNOSIS — S62521D Displaced fracture of distal phalanx of right thumb, subsequent encounter for fracture with routine healing: Secondary | ICD-10-CM

## 2019-04-13 LAB — CBC
HCT: 29.2 % — ABNORMAL LOW (ref 36.0–46.0)
Hemoglobin: 9.6 g/dL — ABNORMAL LOW (ref 12.0–15.0)
MCH: 31.4 pg (ref 26.0–34.0)
MCHC: 32.9 g/dL (ref 30.0–36.0)
MCV: 95.4 fL (ref 80.0–100.0)
Platelets: 165 10*3/uL (ref 150–400)
RBC: 3.06 MIL/uL — ABNORMAL LOW (ref 3.87–5.11)
RDW: 14.5 % (ref 11.5–15.5)
WBC: 10.1 10*3/uL (ref 4.0–10.5)
nRBC: 0 % (ref 0.0–0.2)

## 2019-04-13 LAB — GLUCOSE, CAPILLARY
Glucose-Capillary: 141 mg/dL — ABNORMAL HIGH (ref 70–99)
Glucose-Capillary: 177 mg/dL — ABNORMAL HIGH (ref 70–99)
Glucose-Capillary: 156 mg/dL — ABNORMAL HIGH (ref 70–99)
Glucose-Capillary: 161 mg/dL — ABNORMAL HIGH (ref 70–99)

## 2019-04-13 LAB — BASIC METABOLIC PANEL
Anion gap: 6 (ref 5–15)
BUN: 14 mg/dL (ref 8–23)
CO2: 23 mmol/L (ref 22–32)
Calcium: 8.5 mg/dL — ABNORMAL LOW (ref 8.9–10.3)
Chloride: 107 mmol/L (ref 98–111)
Creatinine, Ser: 0.65 mg/dL (ref 0.44–1.00)
GFR calc Af Amer: >60 mL/min (ref >60)
GFR calc non Af Amer: >60 mL/min (ref >60)
Glucose, Bld: 167 mg/dL — ABNORMAL HIGH (ref 70–99)
Potassium: 4.5 mmol/L (ref 3.5–5.1)
Sodium: 136 mmol/L (ref 135–145)

## 2019-04-13 LAB — RESPIRATORY PANEL BY RT PCR (FLU A&B, COVID)
Influenza A by PCR: NEGATIVE
Influenza B by PCR: NEGATIVE
SARS Coronavirus 2 by RT PCR: NEGATIVE

## 2019-04-13 MED ORDER — PANTOPRAZOLE SODIUM 40 MG PO TBEC: 40.0000 mg | DELAYED_RELEASE_TABLET | Freq: Every day | ORAL | 0 refills | Status: DC

## 2019-04-13 MED ORDER — SENNOSIDES-DOCUSATE SODIUM 8.6-50 MG PO TABS: 1.0000 | ORAL_TABLET | Freq: Every evening | ORAL | 0 refills | Status: DC | PRN

## 2019-04-13 MED ORDER — HYDROCODONE-ACETAMINOPHEN 5-325 MG PO TABS
1.0000 | ORAL_TABLET | ORAL | Status: DC | PRN
  Administered 2019-04-14: 1 via ORAL
  Filled 2019-04-13: qty 1

## 2019-04-13 MED ORDER — ENSURE MAX PROTEIN PO LIQD: 11.0000 [oz_av] | Freq: Two times a day (BID) | ORAL | 0 refills | Status: DC

## 2019-04-13 MED ORDER — DOCUSATE SODIUM 100 MG PO CAPS: 100.0000 mg | ORAL_CAPSULE | Freq: Two times a day (BID) | ORAL | 0 refills | Status: DC

## 2019-04-13 MED ORDER — METHOCARBAMOL 500 MG PO TABS: 500.0000 mg | ORAL_TABLET | Freq: Three times a day (TID) | ORAL | 0 refills | Status: DC | PRN

## 2019-04-13 MED ORDER — HYDROCODONE-ACETAMINOPHEN 5-325 MG PO TABS: 1.0000 | ORAL_TABLET | ORAL | 0 refills | Status: DC | PRN

## 2019-04-13 MED ORDER — MAGNESIUM HYDROXIDE 400 MG/5ML PO SUSP: 30.0000 mL | Freq: Every day | ORAL | 0 refills | Status: DC | PRN

## 2019-04-13 MED ORDER — MAGNESIUM HYDROXIDE 400 MG/5ML PO SUSP
30.0000 mL | Freq: Two times a day (BID) | ORAL | Status: AC
  Administered 2019-04-13 (×2): 30 mL via ORAL
  Filled 2019-04-13 (×2): qty 30

## 2019-04-13 NOTE — Progress Notes (Signed)
Physical Therapy Treatment Patient Details Name: Megan Salinas MRN: [id number] DOB: 1926-05-22 Today's Date: 04/13/2019    History of Present Illness Pt is a 86 y/o F s/p L hip IM nail (04/11/2019) following a left intertrochanteric hip fracture from an accidental fall at home on 04/10/2019. PMH includes: CAD s/p CAGB, CHF, DM, TIA, HLD, HTN, and GERD    PT Comments    Pt pleasant and motivated to participate during the session. Pt found on RA with a SpO2 in the mid-high 80's and after cueing for taking deeper breaths, SpO2 increased to the low 90's, nsg aware. When sitting EOB, pt experienced some lightheadedness, nausea, difficulty maintaining attention, and some blurred vision which pt attributed to her pain medication. Pt stated that she felt unsafe attempting to stand up secondary to symptoms; however, pt was able to stay sitting EOB for over 10 minutes with no increase (or decrease) in reported symptoms. While sitting EOB, pt worked on ind sitting balance but needed UE support for her trunk within 15 seconds on all of her attempts. Pt will benefit from PT services in a SNF setting upon discharge to safely address deficits listed in patient problem list for decreased caregiver assistance and eventual return to PLOF.     Follow Up Recommendations  SNF     Equipment Recommendations  Rolling walker with 5" wheels;3in1 (PT)    Recommendations for Other Services       Precautions / Restrictions Precautions Precautions: Fall Restrictions Weight Bearing Restrictions: No LLE Weight Bearing: Weight bearing as tolerated    Mobility  Bed Mobility Overal bed mobility: Needs Assistance Bed Mobility: Supine to Sit;Sit to Supine     Supine to sit: Mod assist Sit to supine: Max assist(Pt required extra assistance secondary to pt not feeling well when sitting EOB)      Transfers                 General transfer comment: Not attmepted secondary to pt feeling lightheaded, nausea,  and difficulty maintaining attention when sitting EOB. Pt stated that it would be "unsafe" for her to attempt standing up  Ambulation/Gait                 Stairs             Wheelchair Mobility    Modified Rankin (Stroke Patients Only)       Balance Overall balance assessment: Needs assistance   Sitting balance-Leahy Scale: Fair Sitting balance - Comments: Pt was able to sit EOB with UE support for short durations (<15 seconds) before needing single or bil UE support                                    Cognition Arousal/Alertness: Awake/alert Behavior During Therapy: WFL for tasks assessed/performed Overall Cognitive Status: Within Functional Limits for tasks assessed                                        Exercises Total Joint Exercises Ankle Circles/Pumps: AROM;Strengthening;Both;10 reps Quad Sets: Strengthening;Both;10 reps Gluteal Sets: Strengthening;Both;10 reps Towel Squeeze: Strengthening;Both;10 reps Hip ABduction/ADduction: AAROM;Both;10 reps Straight Leg Raises: AAROM;Both;10 reps Long Arc Quad: AROM;Strengthening;Both;10 reps Other Exercises Other Exercises: EOB sitting balance training Other Exercises: education on benefits of PT and WB after surgery    General Comments  Pertinent Vitals/Pain Pain Assessment: 0-10 Pain Score: 0-No pain Pain Location: L hip Pain Descriptors / Indicators: Aching;Sore;Moaning;Spasm;Grimacing Pain Intervention(s): Monitored during session;RN gave pain meds during session    Home Living                      Prior Function            PT Goals (current goals can now be found in the care plan section) Progress towards PT goals: Progressing toward goals    Frequency    BID      PT Plan Current plan remains appropriate    Co-evaluation              AM-PAC PT "6 Clicks" Mobility   Outcome Measure  Help needed turning from your back to your side  while in a flat bed without using bedrails?: A Little Help needed moving from lying on your back to sitting on the side of a flat bed without using bedrails?: A Lot Help needed moving to and from a bed to a chair (including a wheelchair)?: A Lot Help needed standing up from a chair using your arms (e.g., wheelchair or bedside chair)?: A Lot Help needed to walk in hospital room?: Total Help needed climbing 3-5 steps with a railing? : Total 6 Click Score: 11    End of Session   Activity Tolerance: Patient tolerated treatment well Patient left: in bed;with call bell/phone within reach;with family/visitor present;with SCD's reapplied Nurse Communication: Mobility status;Precautions;Weight bearing status(Pt oxygen was in mid-high 80's at beginning of session before cueing for deep breaths) PT Visit Diagnosis: Unsteadiness on feet (R26.81);Other abnormalities of gait and mobility (R26.89);Muscle weakness (generalized) (M62.81);History of falling (Z91.81);Pain Pain - Right/Left: Left Pain - part of body: Hip     Time: [id number] PT Time Calculation (min) (ACUTE ONLY): 41 min  Charges:                        Annabelle Harman, SPT 04/13/19 11:35 AM

## 2019-04-13 NOTE — Progress Notes (Signed)
Physical Therapy Treatment Patient Details Name: Megan Salinas MRN: [id number] DOB: 1926/10/21 Today's Date: 04/13/2019    History of Present Illness Pt is a 86 y/o F s/p L hip IM nail (04/11/2019) following a left intertrochanteric hip fracture from an accidental fall at home on 04/10/2019. PMH includes: CAD s/p CAGB, CHF, DM, TIA, HLD, HTN, and GERD    PT Comments    Pt pleasant and motivated to participate during the session. Pt tolerated EOB sitting better this afternoon and felt safe to attempt standing. In standing, pt experienced L knee buckling with WB that limited her ability to have adequate R foot clearance during gait. Pt reported a hx of L knee buckling but states that it has been worse since having this hip surgery. Pt continued to require physical assistance with bed mobility for both her trunk and her LE's. Pt will benefit from PT services in a SNF setting upon discharge to safely address deficits listed in patient problem list for decreased caregiver assistance and eventual return to PLOF.    Follow Up Recommendations  SNF     Equipment Recommendations  Rolling walker with 5" wheels;3in1 (PT)    Recommendations for Other Services       Precautions / Restrictions Precautions Precautions: Fall;Other (comment) Precaution Comments: Patient with static thumb splint on R hand Restrictions Weight Bearing Restrictions: No LLE Weight Bearing: Weight bearing as tolerated    Mobility  Bed Mobility Overal bed mobility: Needs Assistance Bed Mobility: Supine to Sit;Sit to Supine     Supine to sit: Mod assist Sit to supine: Max assist   General bed mobility comments: Trunk and LE assistance needed to sit EOB and to return to supine  Transfers Overall transfer level: Needs assistance Equipment used: Rolling walker (2 wheeled) Transfers: Sit to/from Stand Sit to Stand: Min assist         General transfer comment: good carryover with hand positioning, good effort,  min assist and max cueing to achieve upright standing posture and to WB through LLE  Ambulation/Gait             General Gait Details: Unsafe to attempt secondary to patient being unable to complete standing marching without physical assistance   Stairs             Wheelchair Mobility    Modified Rankin (Stroke Patients Only)       Balance Overall balance assessment: Needs assistance Sitting-balance support: Single extremity supported;Feet unsupported Sitting balance-Leahy Scale: Fair Sitting balance - Comments: Pt was able to sit EOB with UE support for short durations (<15 seconds) before needing single or bil UE support Postural control: Right lateral lean Standing balance support: Bilateral upper extremity supported;During functional activity Standing balance-Leahy Scale: Fair Standing balance comment: Heavy BUE assist through RW, especially when in LLE stance, occasional LOB from L knee buckling                            Cognition Arousal/Alertness: Awake/alert Behavior During Therapy: WFL for tasks assessed/performed Overall Cognitive Status: Within Functional Limits for tasks assessed                                        Exercises Total Joint Exercises Ankle Circles/Pumps: AROM;Strengthening;Both;10 reps Quad Sets: Strengthening;Both;10 reps Gluteal Sets: Strengthening;Both;10 reps Towel Squeeze: Strengthening;Both;10 reps Long Arc Quad: AROM;Strengthening;Both;10 reps  Marching in Standing: AROM;Strengthening;Both;5 reps Other Exercises Other Exercises: Sitting balance training    General Comments        Pertinent Vitals/Pain Pain Assessment: No/denies pain(denies pain at rest, pain with movement) Pain Location: L hip Pain Descriptors / Indicators: Aching;Sore;Moaning;Spasm;Grimacing Pain Intervention(s): Monitored during session    Home Living                      Prior Function            PT Goals  (current goals can now be found in the care plan section) Progress towards PT goals: Progressing toward goals    Frequency    BID      PT Plan Current plan remains appropriate    Co-evaluation              AM-PAC PT "6 Clicks" Mobility   Outcome Measure  Help needed turning from your back to your side while in a flat bed without using bedrails?: A Little Help needed moving from lying on your back to sitting on the side of a flat bed without using bedrails?: A Lot Help needed moving to and from a bed to a chair (including a wheelchair)?: A Lot Help needed standing up from a chair using your arms (e.g., wheelchair or bedside chair)?: A Lot Help needed to walk in hospital room?: Total Help needed climbing 3-5 steps with a railing? : Total 6 Click Score: 11    End of Session Equipment Utilized During Treatment: Gait belt Activity Tolerance: Patient tolerated treatment well Patient left: in bed;with call bell/phone within reach;with family/visitor present;with SCD's reapplied Nurse Communication: Mobility status;Precautions;Weight bearing status PT Visit Diagnosis: Unsteadiness on feet (R26.81);Other abnormalities of gait and mobility (R26.89);Muscle weakness (generalized) (M62.81);History of falling (Z91.81);Pain Pain - Right/Left: Left Pain - part of body: Hip     Time: [id number] PT Time Calculation (min) (ACUTE ONLY): 26 min  Charges:                        Annabelle Harman, SPT 04/13/19 4:40 PM

## 2019-04-13 NOTE — Care Management Important Message (Signed)
Important Message  Patient Details  Name: Megan Salinas MRN: [id number] Date of Birth: 11-Feb-1926   Medicare Important Message Given:  Yes     Loann Quill 04/13/2019, 11:12 AM

## 2019-04-13 NOTE — Discharge Instructions (Signed)
INSTRUCTIONS AFTER Surgery  o Remove items at home which could result in a fall. This includes throw rugs or furniture in walking pathways o ICE to the affected joint every three hours while awake for 30 minutes at a time, for at least the first 3-5 days, and then as needed for pain and swelling.  Continue to use ice for pain and swelling. You may notice swelling that will progress down to the foot and ankle.  This is normal after surgery.  Elevate your leg when you are not up walking on it.   o Continue to use the breathing machine you got in the hospital (incentive spirometer) which will help keep your temperature down.  It is common for your temperature to cycle up and down following surgery, especially at night when you are not up moving around and exerting yourself.  The breathing machine keeps your lungs expanded and your temperature down.   DIET:  As you were doing prior to hospitalization, we recommend a well-balanced diet.  DRESSING / WOUND CARE / SHOWERING  Dressing change as needed.  Staples will be removed at 2 weeks at the [place] [facility].  No showering.  ACTIVITY  o Increase activity slowly as tolerated, but follow the weight bearing instructions below.   o No driving for 6 weeks or until further direction given by your physician.  You cannot drive while taking narcotics.  o No lifting or carrying greater than 10 lbs. until further directed by your surgeon. o Avoid periods of inactivity such as sitting longer than an hour when not asleep. This helps prevent blood clots.  o You may return to work once you are authorized by your doctor.     WEIGHT BEARING  Weightbearing as tolerated on the left   EXERCISES Gait training and ambulation training with physical therapy.  Strength training.  CONSTIPATION  Constipation is defined medically as fewer than three stools per week and severe constipation as less than one stool per week.  Even if you have a regular bowel pattern at  home, your normal regimen is likely to be disrupted due to multiple reasons following surgery.  Combination of anesthesia, postoperative narcotics, change in appetite and fluid intake all can affect your bowels.   YOU MUST use at least one of the following options; they are listed in order of increasing strength to get the job done.  They are all available over the counter, and you may need to use some, POSSIBLY even all of these options:    Drink plenty of fluids (prune juice may be helpful) and high fiber foods Colace 100 mg by mouth twice a day  Senokot for constipation as directed and as needed Dulcolax (bisacodyl), take with full glass of water  Miralax (polyethylene glycol) once or twice a day as needed.  If you have tried all these things and are unable to have a bowel movement in the first 3-4 days after surgery call either your surgeon or your primary doctor.    If you experience loose stools or diarrhea, hold the medications until you stool forms back up.  If your symptoms do not get better within 1 week or if they get worse, check with your doctor.  If you experience "the worst abdominal pain ever" or develop nausea or vomiting, please contact the office immediately for further recommendations for treatment.   ITCHING:  If you experience itching with your medications, try taking only a single pain pill, or even half a pain pill  at a time.  You can also use Benadryl over the counter for itching or also to help with sleep.   TED HOSE STOCKINGS:  Use stockings on both legs until for at least 2 weeks or as directed by physician office. They may be removed at night for sleeping.  MEDICATIONS:  See your medication summary on the "After Visit Summary" that nursing will review with you.  You may have some home medications which will be placed on hold until you complete the course of blood thinner medication.  It is important for you to complete the blood thinner medication as  prescribed.  PRECAUTIONS:  If you experience chest pain or shortness of breath - call 911 immediately for transfer to the hospital emergency department.   If you develop a fever greater that 101 F, purulent drainage from wound, increased redness or drainage from wound, foul odor from the wound/dressing, or calf pain - CONTACT YOUR SURGEON.                                                   FOLLOW-UP APPOINTMENTS:  If you do not already have a post-op appointment, please call the office for an appointment to be seen by your surgeon.  Guidelines for how soon to be seen are listed in your "After Visit Summary", but are typically between 1-4 weeks after surgery.  OTHER INSTRUCTIONS:  Right thumb splint can remain in place for protection.  Able to remove for bathing.  Gentle range of motion of the thumb and digit is okay outside the splint.  Normal activities the splint should stay in place.   MAKE SURE YOU:  . Understand these instructions.  . Get help right away if you are not doing well or get worse.    Thank you for letting us be a part of your medical care team.  It is a privilege we respect greatly.  We hope these instructions will help you stay on track for a fast and full recovery!

## 2019-04-13 NOTE — Plan of Care (Signed)
  Problem: Education: Goal: Knowledge of General Education information will improve Description: Including pain rating scale, medication(s)/side effects and non-pharmacologic comfort measures 04/13/2019 0517 by Rollen Sox, RN Outcome: Progressing 04/13/2019 0516 by Rollen Sox, RN Outcome: Progressing   Problem: Clinical Measurements: Goal: Ability to maintain clinical measurements within normal limits will improve 04/13/2019 0517 by Rollen Sox, RN Outcome: Progressing 04/13/2019 0516 by Rollen Sox, RN Outcome: Progressing Goal: Will remain free from infection 04/13/2019 0517 by Rollen Sox, RN Outcome: Progressing 04/13/2019 0516 by Rollen Sox, RN Outcome: Progressing Goal: Diagnostic test results will improve 04/13/2019 0517 by Rollen Sox, RN Outcome: Progressing 04/13/2019 0516 by Rollen Sox, RN Outcome: Progressing Goal: Respiratory complications will improve 04/13/2019 0517 by Rollen Sox, RN Outcome: Progressing 04/13/2019 0516 by Rollen Sox, RN Outcome: Progressing Goal: Cardiovascular complication will be avoided 04/13/2019 0517 by Rollen Sox, RN Outcome: Progressing 04/13/2019 0516 by Rollen Sox, RN Outcome: Progressing   Problem: Activity: Goal: Risk for activity intolerance will decrease 04/13/2019 0517 by Rollen Sox, RN Outcome: Progressing 04/13/2019 0516 by Rollen Sox, RN Outcome: Progressing   Problem: Coping: Goal: Level of anxiety will decrease 04/13/2019 0517 by Rollen Sox, RN Outcome: Progressing 04/13/2019 0516 by Rollen Sox, RN Outcome: Progressing   Problem: Pain Managment: Goal: General experience of comfort will improve Outcome: Progressing   Problem: Safety: Goal: Ability to remain free from injury will improve 04/13/2019 0517 by Rollen Sox, RN Outcome: Progressing 04/13/2019 0516 by Rollen Sox,  RN Outcome: Progressing   Problem: Skin Integrity: Goal: Risk for impaired skin integrity will decrease Outcome: Progressing

## 2019-04-13 NOTE — Discharge Summary (Addendum)
Physician Discharge Summary  Megan Salinas [id number] DOB: 1926/02/28 DOA: 04/10/2019  PCP: [name], MD  Admit date: 04/10/2019 Discharge date: 04/14/2019  Admitted From: Assisted living Disposition: Skilled nursing facility  Recommendations for Outpatient Follow-up:  Follow-up with MD at SNF in 1 week Follow-up with orthopedics in 2 weeks.  Patient being discharged on Lovenox for DVT prophylaxis.  Equipment/Devices: As per therapy at SNF  Discharge Condition: Fair CODE STATUS: Full code Diet recommendation: Heart Healthy/carb modified   Discharge Diagnoses:  Principal Problem:   Displaced intertrochanteric fracture of left femur (HCC)  Active Problems:   Chronic systolic CHF (congestive heart failure), NYHA class 3 (HCC)   Coronary artery disease   GERD (gastroesophageal reflux disease)   Lumbar stenosis with neurogenic claudication   Type 2 diabetes mellitus without complication ([place]) Displaced fracture of right distal thumb.  Brief narrative/HPI 86 year old female with chronic systolic CHF ,CAD s/p CABG in 2012, hypertension and type 2 diabetes mellitus presented with mechanical fall at home.  Uses a walker to ambulate at baseline.  No loss of consciousness.  She landed on her left hip and in the ED was found to have displaced left intertrochanteric fracture.  No other injuries sustained. Admitted for further management.  Hospital course Principal Problem:   Displaced intertrochanteric fracture of left femur ([place]) Secondary to mechanical fall.s/p IM nailing on 3/29. Tolerated well. Pain control with vicodin ( patient not tolerating oxycodone or tramadol with sedation) and robaxin for muscle spasms.  Added bowel regimen.  Follow-up with orthopedics in 2 weeks.  Weightbearing as tolerated.  DVT prophylaxis with subcu Lovenox. Seen by PT and recommends SNF.  Stable for discharge.  Active Problems: Displaced fracture of right thumb Secondary to fall. Had  bruising over right thumb but good ROM.  Discussed with orthopedics and recommends thumb splint and will follow her as outpatient.    Chronic systolic CHF (congestive heart failure), NYHA class 3 (HCC)  Coronary artery disease with CABG in 2012 euvolemic.    LVEF improved from prior echo with EF 50-55% with grade 1 DD.  Continue metoprolol, Aldactone and statin. Cardiology consult appreciated for preoperative clearance.  moderate risk for surgery.     Type 2 diabetes mellitus without complication (HCC) Stable.  Recent A1c of 6.8.    Not on home meds.  Stable on sliding scale coverage.       Family Communication  : Daughter at bedside  Disposition Plan  : SNF   Consults  : Orthopedics (Dr. Posey Pronto), cardiology  Procedures  :  IM nail left hip   Discharge Instructions   Allergies as of 04/13/2019      Reactions   Sulfa Antibiotics Itching, Rash   Red, itchy palms      Medication List    STOP taking these medications   clindamycin 300 MG capsule Commonly known as: CLEOCIN      ibuprofen 200 MG tablet Commonly known as: ADVIL   ranitidine 150 MG tablet Commonly known as: ZANTAC     TAKE these medications   acetaminophen 500 MG tablet Commonly known as: TYLENOL Take 500 mg by mouth every 6 (six) hours as needed for mild pain.   amitriptyline 25 MG tablet Commonly known as: ELAVIL Take 25 mg by mouth at bedtime.   aspirin EC 81 MG tablet Take 81 mg by mouth daily.   betamethasone dipropionate 0.05 % ointment Commonly known as: DIPROLENE APPLY TO AFFECTED AREAS ON VULVA TWO TIMES WEEKLY   cetirizine  10 MG tablet Commonly known as: ZYRTEC Take 10 mg by mouth daily.   docusate sodium 100 MG capsule Commonly known as: COLACE Take 1 capsule (100 mg total) by mouth 2 (two) times daily.   enoxaparin 40 MG/0.4ML injection Commonly known as: LOVENOX Inject 0.4 mLs (40 mg total) into the skin daily for 14 days.   Ensure Max Protein Liqd Take 330  mLs (11 oz total) by mouth 2 (two) times daily between meals.   famotidine 20 MG tablet Commonly known as: PEPCID Take 20 mg by mouth 2 (two) times daily.  HYDROcodone-acetaminophen 5-325 MG tablet Commonly known as: NORCO/VICODIN Take 1 tablet every 4 hours as needed for pain.   lisinopril 20 MG tablet Commonly known as: ZESTRIL Take 20 mg by mouth daily.   metoprolol tartrate 25 MG tablet Commonly known as: LOPRESSOR TAKE 1/2 TABLET BY MOUTH TWICE DAILY   Multi-Vitamins Tabs Take 1 tablet by mouth daily.   nystatin cream Commonly known as: MYCOSTATIN Apply one application to affected area as needed.     pantoprazole 40 MG tablet Commonly known as: PROTONIX Take 1 tablet (40 mg total) by mouth daily. What changed: when to take this   polyethylene glycol 17 g packet Commonly known as: MIRALAX / GLYCOLAX Take 17 g by mouth daily.   senna-docusate 8.6-50 MG tablet Commonly known as: Senokot-S Take 1 tablet by mouth at bedtime as needed for mild constipation.   simvastatin 40 MG tablet Commonly known as: ZOCOR TAKE 1 TABLET BY MOUTH EVERY DAY   spironolactone 25 MG tablet Commonly known as: ALDACTONE TAKE 1/2 TABLET BY MOUTH EVERY DAY    Milk of magnesia 400 mg / 5 mL suspension Take 30 mL by mouth as needed for constipation.     Follow-up Information    [facility] [place], [place]. Schedule an appointment as soon as possible for a visit in 2 week(s).   Specialty: Orthopedic Surgery Why: For staple removal and x-rays of the left hip Contact information: [address] [place] [address] [phone]        MD at SNF in 1 week Follow up.          Allergies  Allergen Reactions  . Sulfa Antibiotics Itching and Rash    Red, itchy palms        Procedures/Studies: CT Head Wo Contrast  Result Date: 04/10/2019 CLINICAL DATA:  Recent fall with headaches and neck pain, initial encounter EXAM: CT HEAD WITHOUT CONTRAST CT CERVICAL  SPINE WITHOUT CONTRAST TECHNIQUE: Multidetector CT imaging of the head and cervical spine was performed following the standard protocol without intravenous contrast. Multiplanar CT image reconstructions of the cervical spine were also generated. COMPARISON:  01/28/2016, 01/14/2018 FINDINGS: CT HEAD FINDINGS Brain: No evidence of acute infarction, hemorrhage, hydrocephalus, extra-axial collection or mass lesion/mass effect. Chronic atrophic and ischemic changes are again noted and stable. Vascular: No hyperdense vessel or unexpected calcification. Skull: Normal. Negative for fracture or focal lesion. Sinuses/Orbits: Mucosal retention cyst is noted in the left maxillary antrum stable from the prior exams. The paranasal sinuses are otherwise within normal limits. Mastoid air cells on the left demonstrate opacification. This is stable from the prior exam of 2020. Other: None. CT CERVICAL SPINE FINDINGS Alignment: Within normal limits. Skull base and vertebrae: 7 cervical segments are well visualized. Vertebral body height is well maintained. Multilevel facet hypertrophic changes and osteophytic changes are seen. No acute fracture or acute facet abnormality is noted. Neural foraminal narrowing is  noted bilaterally. Soft tissues and spinal canal: Scattered calcifications and hypodense nodular changes are noted throughout the thyroid. None of these are significant by size criteria. The remaining soft tissues of the neck are within normal limits. Upper chest: Visualized lung apices are within normal limits. Other: None IMPRESSION: CT of the head: Chronic atrophic and ischemic changes without acute intracranial abnormality. Chronic left mastoid opacification CT of the cervical spine: Multilevel degenerative changes without acute abnormality. Multiple small hypodensities and calcifications noted throughout the thyroid. No follow-up recommended unless clinically warranted (ref: J Am Coll Radiol. 2015 Feb;12(2): 143-50).  Electronically Signed   By: Inez Catalina M.D.   On: 04/10/2019 21:35   CT Cervical Spine Wo Contrast  Result Date: 04/10/2019 CLINICAL DATA:  Recent fall with headaches and neck pain, initial encounter EXAM: CT HEAD WITHOUT CONTRAST CT CERVICAL SPINE WITHOUT CONTRAST TECHNIQUE: Multidetector CT imaging of the head and cervical spine was performed following the standard protocol without intravenous contrast. Multiplanar CT image reconstructions of the cervical spine were also generated. COMPARISON:  01/28/2016, 01/14/2018 FINDINGS: CT HEAD FINDINGS Brain: No evidence of acute infarction, hemorrhage, hydrocephalus, extra-axial collection or mass lesion/mass effect. Chronic atrophic and ischemic changes are again noted and stable. Vascular: No hyperdense vessel or unexpected calcification. Skull: Normal. Negative for fracture or focal lesion. Sinuses/Orbits: Mucosal retention cyst is noted in the left maxillary antrum stable from the prior exams. The paranasal sinuses are otherwise within normal limits. Mastoid air cells on the left demonstrate opacification. This is stable from the prior exam of 2020. Other: None. CT CERVICAL SPINE FINDINGS Alignment: Within normal limits. Skull base and vertebrae: 7 cervical segments are well visualized. Vertebral body height is well maintained. Multilevel facet hypertrophic changes and osteophytic changes are seen. No acute fracture or acute facet abnormality is noted. Neural foraminal narrowing is noted bilaterally. Soft tissues and spinal canal: Scattered calcifications and hypodense nodular changes are noted throughout the thyroid. None of these are significant by size criteria. The remaining soft tissues of the neck are within normal limits. Upper chest: Visualized lung apices are within normal limits. Other: None IMPRESSION: CT of the head: Chronic atrophic and ischemic changes without acute intracranial abnormality. Chronic left mastoid opacification CT of the cervical  spine: Multilevel degenerative changes without acute abnormality. Multiple small hypodensities and calcifications noted throughout the thyroid. No follow-up recommended unless clinically warranted (ref: J Am Coll Radiol. 2015 Feb;12(2): 143-50). Electronically Signed   By: Inez Catalina M.D.   On: 04/10/2019 21:35   DG Chest Port 1 View  Result Date: 04/10/2019 CLINICAL DATA:  Preop EXAM: PORTABLE CHEST 1 VIEW COMPARISON:  02/28/2017 FINDINGS: Increased interstitial markings. This appearance favors chronic interstitial lung disease over interstitial edema or mild pneumonia. Mild right lower lung opacity favors atelectasis. No pleural effusion or pneumothorax. Cardiomegaly. Postsurgical changes related to prior CABG. Thoracic aortic atherosclerosis. Degenerative changes of the bilateral shoulders. IMPRESSION: Increased interstitial markings, favoring chronic interstitial lung disease, with mild right lower lobe atelectasis. Electronically Signed   By: Julian Hy M.D.   On: 04/10/2019 22:28   DG Finger Thumb Right  Result Date: 04/12/2019 CLINICAL DATA:  Cyanosis of finger tip. No known injury. EXAM: RIGHT THUMB 2+V COMPARISON:  None. FINDINGS: The bones are demineralized. There is a mildly displaced extra-articular fracture through the distal phalanx of the right thumb. There is no evidence of bone destruction or dislocation. Mild degenerative changes are present at the interphalangeal and metacarpal phalangeal joints. There is soft tissue swelling throughout  the thumb without evidence of foreign body or soft tissue emphysema. IMPRESSION: Mildly displaced extra-articular fracture of the distal phalanx of the right thumb. Electronically Signed   By: Richardean Sale M.D.   On: 04/12/2019 11:30   ECHOCARDIOGRAM COMPLETE  Result Date: 04/11/2019    ECHOCARDIOGRAM REPORT   Patient Name:   ZAREYA PARRIS Date of Exam: 04/11/2019 Medical Rec #:  [id number]       Height:       64.0 in Accession #:     [id number]      Weight:       154.0 lb Date of Birth:  03/13/1926       BSA:          1.751 m Patient Age:    86 years        BP:           154/55 mmHg Patient Gender: F               HR:           86 bpm. Exam Location:  [facility] Procedure: 2D Echo, Color Doppler and Cardiac Doppler Indications:     I25.10 CAD Native Vessel  History:         Patient has prior history of Echocardiogram examinations. CHF,                  CAD, Prior CABG, TIA; Risk Factors:Hypertension and HCL.  Sonographer:     Charmayne Sheer RDCS (AE) Referring Phys:  [id number] [name] Diagnosing Phys: Yolonda Kida MD  Sonographer Comments: Suboptimal parasternal window. TDS due to inability to position pt due to broken left hip. IMPRESSIONS  1. Left ventricular ejection fraction, by estimation, is 50 to 55%. Left ventricular ejection fraction by 2D MOD biplane is 54.6 %. The left ventricle has low normal function. The left ventricle demonstrates regional wall motion abnormalities (see scoring diagram/findings for description). Left ventricular diastolic parameters are consistent with Grade I diastolic dysfunction (impaired relaxation).  2. Right ventricular systolic function is normal. The right ventricular size is normal.  3. The mitral valve is grossly normal. Trivial mitral valve regurgitation.  4. The aortic valve is grossly normal. Aortic valve regurgitation is mild to moderate. Mild aortic valve stenosis. FINDINGS  Left Ventricle: Left ventricular ejection fraction, by estimation, is 50 to 55%. Left ventricular ejection fraction by 2D MOD biplane is 54.6 %. The left ventricle has low normal function. The left ventricle demonstrates regional wall motion abnormalities. The left ventricular internal cavity size was normal in size. There is no left ventricular hypertrophy. Left ventricular diastolic parameters are consistent with Grade I diastolic dysfunction (impaired relaxation). Right Ventricle: The right ventricular size is normal. No  increase in right ventricular wall thickness. Right ventricular systolic function is normal. Left Atrium: Left atrial size was normal in size. Right Atrium: Right atrial size was normal in size. Pericardium: There is no evidence of pericardial effusion. Mitral Valve: The mitral valve is grossly normal. Trivial mitral valve regurgitation. MV peak gradient, 9.4 mmHg. The mean mitral valve gradient is 4.0 mmHg. Tricuspid Valve: The tricuspid valve is normal in structure. Tricuspid valve regurgitation is mild. Aortic Valve: The aortic valve is grossly normal. Aortic valve regurgitation is mild to moderate. Aortic regurgitation PHT measures 443 msec. Mild aortic stenosis is present. Aortic valve mean gradient measures 7.0 mmHg. Aortic valve peak gradient measures 11.7 mmHg. Aortic valve area, by VTI measures 1.99 cm. Pulmonic Valve: The pulmonic  valve was normal in structure. Pulmonic valve regurgitation is not visualized. Aorta: The aortic root is normal in size and structure. IAS/Shunts: No atrial level shunt detected by color flow Doppler.  LEFT VENTRICLE PLAX 2D                        Biplane EF (MOD) LVIDd:         2.95 cm         LV Biplane EF:   Left LVIDs:         2.60 cm                          ventricular LV PW:         0.86 cm                          ejection LV IVS:        0.82 cm                          fraction by LVOT diam:     2.10 cm                          2D MOD LV SV:         65                               biplane is LV SV Index:   37                               54.6 %. LVOT Area:     3.46 cm                                Diastology                                LV e' lateral:   11.50 cm/s LV Volumes (MOD)               LV E/e' lateral: 9.0 LV vol d, MOD    87.3 ml       LV e' medial:    9.46 cm/s A2C:                           LV E/e' medial:  10.9 LV vol d, MOD    109.0 ml A4C: LV vol s, MOD    52.0 ml A2C: LV vol s, MOD    39.2 ml A4C: LV SV MOD A2C:   35.3 ml LV SV MOD A4C:   109.0 ml  LV SV MOD BP:    54.6 ml RIGHT VENTRICLE RV Basal diam:  3.49 cm LEFT ATRIUM             Index       RIGHT ATRIUM           Index LA diam:        3.20 cm 1.83 cm/m  RA Area:     17.70 cm LA Vol (A2C):   61.1 ml 34.90 ml/m RA  Volume:   44.90 ml  25.65 ml/m LA Vol (A4C):   38.7 ml 22.11 ml/m LA Biplane Vol: 49.6 ml 28.33 ml/m  AORTIC VALVE                    PULMONIC VALVE AV Area (Vmax):    2.17 cm     PV Vmax:       1.02 m/s AV Area (Vmean):   1.96 cm     PV Vmean:      70.600 cm/s AV Area (VTI):     1.99 cm     PV VTI:        0.178 m AV Vmax:           171.00 cm/s  PV Peak grad:  4.2 mmHg AV Vmean:          121.000 cm/s PV Mean grad:  2.0 mmHg AV VTI:            0.326 m AV Peak Grad:      11.7 mmHg AV Mean Grad:      7.0 mmHg LVOT Vmax:         107.00 cm/s LVOT Vmean:        68.400 cm/s LVOT VTI:          0.187 m LVOT/AV VTI ratio: 0.57 AI PHT:            443 msec  AORTA Ao Root diam: 3.40 cm MITRAL VALVE MV Area (PHT): 4.80 cm     SHUNTS MV Peak grad:  9.4 mmHg     Systemic VTI:  0.19 m MV Mean grad:  4.0 mmHg     Systemic Diam: 2.10 cm MV Vmax:       1.53 m/s MV Vmean:      96.4 cm/s MV Decel Time: 158 msec MV E velocity: 103.00 cm/s MV A velocity: 149.00 cm/s MV E/A ratio:  0.69 Dwayne D Callwood MD Electronically signed by Yolonda Kida MD Signature Date/Time: 04/11/2019/12:11:46 PM    Final    DG HIP OPERATIVE UNILAT W OR W/O PELVIS LEFT  Result Date: 04/11/2019 CLINICAL DATA:  Left femur intramedullary rod.  Left hip ORIF. EXAM: OPERATIVE left HIP (WITH PELVIS IF PERFORMED) 5 VIEWS TECHNIQUE: Fluoroscopic spot image(s) were submitted for interpretation post-operatively. COMPARISON:  Left hip radiographs 04/10/2019 FINDINGS: Intraoperative images demonstrate a left femoral intramedullary rod. 2 screws cross the intratrochanteric fracture. Fracture has been reduced. Alignment is anatomic. IMPRESSION: Left hip ORIF without radiographic evidence for complication. Electronically Signed   By:  San Morelle M.D.   On: 04/11/2019 15:19   DG HIP UNILAT WITH PELVIS 2-3 VIEWS LEFT  Result Date: 04/10/2019 CLINICAL DATA:  Pain EXAM: DG HIP (WITH OR WITHOUT PELVIS) 2-3V LEFT COMPARISON:  None. FINDINGS: There is an acute displaced intratrochanteric fracture of the proximal left femur. Multiple displaced fracture fragments are noted. There is diffuse osteopenia. There are degenerative changes of both hips. There is no dislocation. Vascular calcifications are noted. IMPRESSION: Acute displaced intratrochanteric fracture of the proximal left femur. Electronically Signed   By: Constance Holster M.D.   On: 04/10/2019 21:53       Subjective: Pain better controlled.  No overnight events.  Discharge Exam: Vitals:   04/12/19 2317 04/13/19 0736  BP: (!) 112/56 (!) 127/58  Pulse: 81 95  Resp: 14 16  Temp: 98.3 F (36.8 C) 99.3 F (37.4 C)  SpO2: 92% 93%   Vitals:   04/12/19 1509 04/12/19  2114 04/12/19 2317 04/13/19 0736  BP: (!) 122/54 (!) 119/57 (!) 112/56 (!) 127/58  Pulse: 92 86 81 95  Resp: 16  14 16   Temp: 98.2 F (36.8 C)  98.3 F (36.8 C) 99.3 F (37.4 C)  TempSrc: Oral  Oral Oral  SpO2: 92%  92% 93%   Vitals for 4/1 Temp 43F, HR 72, bp: 145/66 mmhg, rr:16  General: Elderly female not in distress HEENT: Moist with a, supple neck Chest: Clear bilaterally CVs: Normal S1-S2 GI: Soft, nondistended, nontender Musculoskeletal: Warm, no edema, clean dressing over left hip, right thumb splint    The results of significant diagnostics from this hospitalization (including imaging, microbiology, ancillary and laboratory) are listed below for reference.     Microbiology: Recent Results (from the past 240 hour(s))  Respiratory Panel by RT PCR (Flu A&B, Covid) - Nasopharyngeal Swab     Status: None   Collection Time: 04/10/19 10:45 PM   Specimen: Nasopharyngeal Swab  Result Value Ref Range Status   SARS Coronavirus 2 by RT PCR NEGATIVE NEGATIVE Final    Comment:  (NOTE) SARS-CoV-2 target nucleic acids are NOT DETECTED. The SARS-CoV-2 RNA is generally detectable in upper respiratoy specimens during the acute phase of infection. The lowest concentration of SARS-CoV-2 viral copies this assay can detect is 131 copies/mL. A negative result does not preclude SARS-Cov-2 infection and should not be used as the sole basis for treatment or other patient management decisions. A negative result may occur with  improper specimen collection/handling, submission of specimen other than nasopharyngeal swab, presence of viral mutation(s) within the areas targeted by this assay, and inadequate number of viral copies (<131 copies/mL). A negative result must be combined with clinical observations, patient history, and epidemiological information. The expected result is Negative. Fact Sheet for Patients:  PinkCheek.be Fact Sheet for Healthcare Providers:  GravelBags.it This test is not yet ap proved or cleared by the [place] FDA and  has been authorized for detection and/or diagnosis of SARS-CoV-2 by FDA under an Emergency Use Authorization (EUA). This EUA will remain  in effect (meaning this test can be used) for the duration of the COVID-19 declaration under Section 564(b)(1) of the Act, 21 U.S.C. section 360bbb-3(b)(1), unless the authorization is terminated or revoked sooner.    Influenza A by PCR NEGATIVE NEGATIVE Final   Influenza B by PCR NEGATIVE NEGATIVE Final    Comment: (NOTE) The Xpert Xpress SARS-CoV-2/FLU/RSV assay is intended as an aid in  the diagnosis of influenza from Nasopharyngeal swab specimens and  should not be used as a sole basis for treatment. Nasal washings and  aspirates are unacceptable for Xpert Xpress SARS-CoV-2/FLU/RSV  testing. Fact Sheet for Patients: PinkCheek.be Fact Sheet for Healthcare  Providers: GravelBags.it This test is not yet approved or cleared by the [place] FDA and  has been authorized for detection and/or diagnosis of SARS-CoV-2 by  FDA under an Emergency Use Authorization (EUA). This EUA will remain  in effect (meaning this test can be used) for the duration of the  Covid-19 declaration under Section 564(b)(1) of the Act, 21  U.S.C. section 360bbb-3(b)(1), unless the authorization is  terminated or revoked. Performed at [facility], [address] [place],  [address]   Surgical pcr screen     Status: Abnormal   Collection Time: 04/11/19  3:01 AM   Specimen: Nasal Mucosa; Nasal Swab  Result Value Ref Range Status   MRSA, PCR POSITIVE (A) NEGATIVE Final    Comment: RESULT  CALLED TO, READ BACK BY AND VERIFIED WITH: TRICIA KING 04/11/19 AT 0443 HS    Staphylococcus aureus POSITIVE (A) NEGATIVE Final    Comment: (NOTE) The Xpert SA Assay (FDA approved for NASAL specimens in patients 58 years of age and older), is one component of a comprehensive surveillance program. It is not intended to diagnose infection nor to guide or monitor treatment. Performed at [facility], [address] [place], [place] [address]      Labs: BNP (last 3 results) No results for input(s): BNP in the last 8760 hours. Basic Metabolic Panel: Recent Labs  Lab 04/10/19 2245 04/12/19 0534 04/13/19 0531  NA 137 137 136  K 3.8 4.4 4.5  CL 106 108 107  CO2 22 21* 23  GLUCOSE 177* 185* 167*  BUN 13 13 14   CREATININE 0.66 0.62 0.65  CALCIUM 9.6 9.0 8.5*   Liver Function Tests: No results for input(s): AST, ALT, ALKPHOS, BILITOT, PROT, ALBUMIN in the last 168 hours. No results for input(s): LIPASE, AMYLASE in the last 168 hours. No results for input(s): AMMONIA in the last 168 hours. CBC: Recent Labs  Lab 04/10/19 2245 04/12/19 0534 04/13/19 0531  WBC 11.0* 9.6 10.1  NEUTROABS 8.6*  --   --   HGB 12.4 9.9*  9.6*  HCT 37.1 29.6* 29.2*  MCV 93.5 93.7 95.4  PLT 231 183 165   Cardiac Enzymes: No results for input(s): CKTOTAL, CKMB, CKMBINDEX, TROPONINI in the last 168 hours. BNP: Invalid input(s): POCBNP CBG: Recent Labs  Lab 04/12/19 1231 04/12/19 1810 04/12/19 2112 04/13/19 0737 04/13/19 1139  GLUCAP 171* 152* 135* 141* 177*   D-Dimer No results for input(s): DDIMER in the last 72 hours. Hgb A1c Recent Labs    04/10/19 2245  HGBA1C 6.8*   Lipid Profile No results for input(s): CHOL, HDL, LDLCALC, TRIG, CHOLHDL, LDLDIRECT in the last 72 hours. Thyroid function studies No results for input(s): TSH, T4TOTAL, T3FREE, THYROIDAB in the last 72 hours.  Invalid input(s): FREET3 Anemia work up No results for input(s): VITAMINB12, FOLATE, FERRITIN, TIBC, IRON, RETICCTPCT in the last 72 hours. Urinalysis    Component Value Date/Time   COLORURINE Yellow 09/19/2013 1329   APPEARANCEUR Clear 09/19/2013 1329   LABSPEC 1.014 09/19/2013 1329   PHURINE 5.0 09/19/2013 1329   GLUCOSEU Negative 09/19/2013 1329   HGBUR Negative 09/19/2013 1329   BILIRUBINUR Negative 09/19/2013 1329   KETONESUR Negative 09/19/2013 1329   PROTEINUR Negative 09/19/2013 1329   NITRITE Negative 09/19/2013 1329   LEUKOCYTESUR Negative 09/19/2013 1329   Sepsis Labs Invalid input(s): PROCALCITONIN,  WBC,  LACTICIDVEN Microbiology Recent Results (from the past 240 hour(s))  Respiratory Panel by RT PCR (Flu A&B, Covid) - Nasopharyngeal Swab     Status: None   Collection Time: 04/10/19 10:45 PM   Specimen: Nasopharyngeal Swab  Result Value Ref Range Status   SARS Coronavirus 2 by RT PCR NEGATIVE NEGATIVE Final    Comment: (NOTE) SARS-CoV-2 target nucleic acids are NOT DETECTED. The SARS-CoV-2 RNA is generally detectable in upper respiratoy specimens during the acute phase of infection. The lowest concentration of SARS-CoV-2 viral copies this assay can detect is 131 copies/mL. A negative result does not  preclude SARS-Cov-2 infection and should not be used as the sole basis for treatment or other patient management decisions. A negative result may occur with  improper specimen collection/handling, submission of specimen other than nasopharyngeal swab, presence of viral mutation(s) within the areas targeted by this assay, and inadequate number  of viral copies (<131 copies/mL). A negative result must be combined with clinical observations, patient history, and epidemiological information. The expected result is Negative. Fact Sheet for Patients:  PinkCheek.be Fact Sheet for Healthcare Providers:  GravelBags.it This test is not yet ap proved or cleared by the [place] FDA and  has been authorized for detection and/or diagnosis of SARS-CoV-2 by FDA under an Emergency Use Authorization (EUA). This EUA will remain  in effect (meaning this test can be used) for the duration of the COVID-19 declaration under Section 564(b)(1) of the Act, 21 U.S.C. section 360bbb-3(b)(1), unless the authorization is terminated or revoked sooner.    Influenza A by PCR NEGATIVE NEGATIVE Final   Influenza B by PCR NEGATIVE NEGATIVE Final    Comment: (NOTE) The Xpert Xpress SARS-CoV-2/FLU/RSV assay is intended as an aid in  the diagnosis of influenza from Nasopharyngeal swab specimens and  should not be used as a sole basis for treatment. Nasal washings and  aspirates are unacceptable for Xpert Xpress SARS-CoV-2/FLU/RSV  testing. Fact Sheet for Patients: PinkCheek.be Fact Sheet for Healthcare Providers: GravelBags.it This test is not yet approved or cleared by the [place] FDA and  has been authorized for detection and/or diagnosis of SARS-CoV-2 by  FDA under an Emergency Use Authorization (EUA). This EUA will remain  in effect (meaning this test can be used) for the duration of the   Covid-19 declaration under Section 564(b)(1) of the Act, 21  U.S.C. section 360bbb-3(b)(1), unless the authorization is  terminated or revoked. Performed at [facility], [address] [place], [place] [address]   Surgical pcr screen     Status: Abnormal   Collection Time: 04/11/19  3:01 AM   Specimen: Nasal Mucosa; Nasal Swab  Result Value Ref Range Status   MRSA, PCR POSITIVE (A) NEGATIVE Final    Comment: RESULT CALLED TO, READ BACK BY AND VERIFIED WITH: TRICIA KING 04/11/19 AT 0443 HS    Staphylococcus aureus POSITIVE (A) NEGATIVE Final    Comment: (NOTE) The Xpert SA Assay (FDA approved for NASAL specimens in patients 59 years of age and older), is one component of a comprehensive surveillance program. It is not intended to diagnose infection nor to guide or monitor treatment. Performed at [facility], [address], [place], [place] [address]      Time coordinating discharge: 35 minutes  SIGNED:   Louellen Molder, MD  [facility] 04/13/2019, 12:40 PM Pager   If 7PM-7AM, please contact night-coverage [email or web address] Password [email or web address]

## 2019-04-13 NOTE — Progress Notes (Signed)
  Subjective: 2 Days Post-Op Procedure(s) (LRB): INTRAMEDULLARY (IM) NAIL INTERTROCHANTRIC (Left) Patient reports pain as moderate.  She is having occasional spasms in the left thigh. Patient is well, and has had no acute complaints or problems Plan is to go Rehab after hospital stay. Negative for chest pain and shortness of breath Fever: no Gastrointestinal: Negative for nausea and vomiting  Objective: Vital signs in last 24 hours: Temp:  [98.2 F (36.8 C)-98.4 F (36.9 C)] 98.3 F (36.8 C) (03/30 2317) Pulse Rate:  [81-92] 81 (03/30 2317) Resp:  [14-17] 14 (03/30 2317) BP: (112-154)/(54-67) 112/56 (03/30 2317) SpO2:  [92 %-94 %] 92 % (03/30 2317)  Intake/Output from previous day:  Intake/Output Summary (Last 24 hours) at 04/13/2019 0702 Last data filed at 04/13/2019 0129 Gross per 24 hour  Intake 776.14 ml  Output 200 ml  Net 576.14 ml    Intake/Output this shift: No intake/output data recorded.  Labs: Recent Labs    04/10/19 2245 04/12/19 0534 04/13/19 0531  HGB 12.4 9.9* 9.6*   Recent Labs    04/12/19 0534 04/13/19 0531  WBC 9.6 10.1  RBC 3.16* 3.06*  HCT 29.6* 29.2*  PLT 183 165   Recent Labs    04/12/19 0534 04/13/19 0531  NA 137 136  K 4.4 4.5  CL 108 107  CO2 21* 23  BUN 13 14  CREATININE 0.62 0.65  GLUCOSE 185* 167*  CALCIUM 9.0 8.5*   Recent Labs    04/10/19 2245  INR 1.1     EXAM General - Patient is Alert and Oriented Extremity - Neurovascular intact Sensation intact distally Dorsiflexion/Plantar flexion intact Compartment soft  Right thumb splint in place.  Able to move her other fingers and hand without thumb discomfort now.  Decreasing edema. Dressing/Incision - clean, dry, no drainage Motor Function - intact, moving foot and toes well on exam.   Past Medical History:  Diagnosis Date  . Back pain   . CAD (coronary artery disease)   . CHF (congestive heart failure) ([place])   . GERD (gastroesophageal reflux disease)   .  High cholesterol   . Hypertension   . TIA (transient ischemic attack) 01/2016   "possible", no deficits  . Wears hearing aid    bilateral    Assessment/Plan: 2 Days Post-Op Procedure(s) (LRB): INTRAMEDULLARY (IM) NAIL INTERTROCHANTRIC (Left) Principal Problem:   Displaced intertrochanteric fracture of left femur (HCC) Active Problems:   Chronic systolic CHF (congestive heart failure), NYHA class 3 (HCC)   Coronary artery disease   Type 2 diabetes mellitus without complication (HCC)   Closed left hip fracture, initial encounter ([place])   Preop examination  Estimated body mass index is 26.43 kg/m as calculated from the following:   Height as of 08/27/18: 5\' 4"  (1.626 m).   Weight as of 08/27/18: 69.9 kg. Advance diet Up with therapy D/C IV fluids Discharge to SNF per medicine. Follow-up at clinical clinic in 2 weeks for staple removal. Continue splint on right thumb for protection.  Nonsurgical after x-rays.  DVT Prophylaxis - Lovenox, Foot Pumps and TED hose Weight-Bearing as tolerated to left leg  Reche Dixon, PA-C Orthopaedic Surgery 04/13/2019, 7:02 AM

## 2019-04-13 NOTE — Progress Notes (Signed)
Occupational Therapy Treatment Patient Details Name: Megan Salinas MRN: [id number] DOB: [birth year] Today's Date: 04/13/2019    History of present illness Pt is a 86 y/o F s/p L hip IM nail (04/11/2019) following a left intertrochanteric hip fracture from an accidental fall at home on 04/10/2019. PMH includes: CAD s/p CAGB, CHF, DM, TIA, HLD, HTN, and GERD   OT comments  Patient supine in bed, asleep, with daughter at bedside upon entry.  Daughter is greatly concerned with pt's health as she has not had a BM and pain medication is giving her several side effects, making it difficult to participate in therapy.  Provided therapeutic use of self/support and education on patient's current medical status from occupational therapy standpoint.  Discussed use of incentive spirometer and upright positioning techniques to improve overall breathing abilities, 02 saturation and level of alertness.  Verbalized understanding.  Discussed use of technology upon d/c to improve overall independence regarding hearing deficits.  Patient also now has R thumb static splint; provided education on precautions to daughter.  Verbalized understanding.  Patient would benefit from continued occupational therapy services to address deficits in performance components outlined in evaluation.  Based on today's performance, follow up recommendation remains appropriate.      Follow Up Recommendations  SNF    Equipment Recommendations  3 in 1 bedside commode;Other (comment)(defer to next level of care)    Recommendations for Other Services      Precautions / Restrictions Precautions Precautions: Fall;Other (comment) Precaution Comments: Patient with static thumb splint on R hand Restrictions Weight Bearing Restrictions: No LLE Weight Bearing: Weight bearing as tolerated       Mobility Bed Mobility       Transfers                 General transfer comment: Patient unable to participate secondary to heavy  lethargy, suspected due to medication.    Balance Overall balance assessment: Needs assistance                                      ADL either performed or assessed with clinical judgement   ADL Overall ADL's : Needs assistance/impaired                                       General ADL Comments: Patient with poor participation this date, suspected d/t medication     Vision Patient Visual Report: No change from baseline     Perception     Praxis      Cognition Arousal/Alertness: Lethargic;Suspect due to medications Behavior During Therapy: [facility] for tasks assessed/performed Overall Cognitive Status: Within Functional Limits for tasks assessed                                 General Comments: Very lethargic; falling asleep between questions and cues        Exercises  Other Exercises Other Exercises: Completed extensive education with daughter and pt regarding current medical status and need to participate in OOB activities Other Exercises: Provided education on use of technology/alexa echo devices to improve independence upon return home Other Exercises: Provided education on positioning techniques to improve alertness and ability to cough up mucus   Shoulder Instructions  General Comments      Pertinent Vitals/ Pain       Pain Assessment: No/denies pain Pain Score: 0-No pain   Home Living                                          Prior Functioning/Environment              Frequency  Min 1X/week        Progress Toward Goals  OT Goals(current goals can now be found in the care plan section)  Progress towards OT goals: OT to reassess next treatment     Plan Discharge plan remains appropriate;Frequency remains appropriate    Co-evaluation                 AM-PAC OT "6 Clicks" Daily Activity     Outcome Measure                    End of Session    OT Visit  Diagnosis: Unsteadiness on feet (R26.81);Muscle weakness (generalized) (M62.81);History of falling (Z91.81)   Activity Tolerance Patient limited by lethargy   Patient Left in bed;with call bell/phone within reach;with bed alarm set;with family/visitor present   Nurse Communication Other (comment)(Spoke with nurse regarding pt's daughter's concerns about medical status and pain medication side effects)        Time: [id number] OT Time Calculation (min): 31 min  Charges: OT General Charges $OT Visit: 1 Visit OT Treatments $Self Care/Home Management : 8-22 mins $Therapeutic Activity: 8-22 mins  Baldomero Lamy, MS, OTR/L 04/13/19, 12:17 PM

## 2019-04-13 NOTE — Progress Notes (Signed)
[facility] Cardiology  Patient Description: Megan Salinas is a 86 year old Caucasian female with PMH significant for CAD s/p CABG (0000000), chronic systolic heart failure with an EF of 45%-50%, hypertension, hyperlipidemia, DM Type II, TIA, GERD and back pain who suffered a fall in her garage on 04/10/2019 and due to the patient's extensive cardiac history, cardiology was consulted for pre-operative clearance. The patient    underwent Intramedullary nailing of the left femur on 04/12/19.  SUBJECTIVE:  (04/12/19)The patient reports to be feeling fairly well but complains of fatigue and intermittent muscle spasms in the left leg.  The patient denies any shortness of breath, chest pain, palpitations, unilateral weakness, dizziness or syncope at this time.  (04/13/19)The patient reports to be feeling slightly better than yesterday and that she was able to sleep well on last night. She states that she would like to receive her pain medication closer to the time of her therapy due to the surgical pain. She also expresses concerns over her left knee as she has had issues with it prior to her fall and feel as if the left knee may prevent her from fully recovering and being active again.  The patient denies any chest pain, dyspnea, dizziness, fatigue, syncope or peripheral edema.   OBJECTIVE:  (04/12/19)The patient appears to be doing fairly well and is resting comfortably in bed.  Echocardiogram was obtained on yesterday and it revealed some improvement with an EF of 50-55%, otherwise findings are fairly consistent with previous 2019 study. The patient is noted to have +2 radial pulses bilaterally and +2 pedal pulses bilaterally.  The patient brought to my attention her right thumb which appears to be severely edematous with signs of cyanosis.   (04/13/19) The patient appears euvolemic, without any signs of distress  and seems to be doing much better on today. She is resting comfortably in a low-bed and has an purwick in place  for urinary collection. The patient now has a right thumb splint in place.   Vitals:   04/12/19 1509 04/12/19 2114 04/12/19 2317 04/13/19 0736  BP: (!) 122/54 (!) 119/57 (!) 112/56 (!) 127/58  Pulse: 92 86 81 95  Resp: 16  14 16   Temp: 98.2 F (36.8 C)  98.3 F (36.8 C) 99.3 F (37.4 C)  TempSrc: Oral  Oral Oral  SpO2: 92%  92% 93%     Intake/Output Summary (Last 24 hours) at 04/13/2019 0809 Last data filed at 04/13/2019 0129 Gross per 24 hour  Intake 776.14 ml  Output 200 ml  Net 576.14 ml    PHYSICAL EXAM  General: Well developed, well nourished, in no acute distress HEENT:  Normocephalic and atraumatic Neck:  No JVD.  Lungs: Clear bilaterally to auscultation  Heart: HRRR . Normal S1 and S2 without gallops. Soft murmur  Abdomen: Bowel sounds are positive, abdomen soft and non-tender  Msk:  Normal strength in BUE for age. Diminished strength in LLE. Normal strength for age in RLE. Extremities: mild, non-pitting edema in BLE. No clubbing. Right thumb remains in splint.    Neuro: Alert and oriented X 3. Psych:  Good affect, responds appropriately   LABS: Basic Metabolic Panel: Recent Labs    04/12/19 0534 04/13/19 0531  NA 137 136  K 4.4 4.5  CL 108 107  CO2 21* 23  GLUCOSE 185* 167*  BUN 13 14  CREATININE 0.62 0.65  CALCIUM 9.0 8.5*   Liver Function Tests: No results for input(s): AST, ALT, ALKPHOS, BILITOT, PROT, ALBUMIN in the last  72 hours. No results for input(s): LIPASE, AMYLASE in the last 72 hours. CBC: Recent Labs    04/10/19 2245 04/10/19 2245 04/12/19 0534 04/13/19 0531  WBC 11.0*   < > 9.6 10.1  NEUTROABS 8.6*  --   --   --   HGB 12.4   < > 9.9* 9.6*  HCT 37.1   < > 29.6* 29.2*  MCV 93.5   < > 93.7 95.4  PLT 231   < > 183 165   < > = values in this interval not displayed.   Cardiac Enzymes: No results for input(s): CKTOTAL, CKMB, CKMBINDEX, TROPONINI in the last 72 hours. BNP: Invalid input(s): POCBNP D-Dimer: No results for  input(s): DDIMER in the last 72 hours. Hemoglobin A1C: Recent Labs    04/10/19 2245  HGBA1C 6.8*   Fasting Lipid Panel: No results for input(s): CHOL, HDL, LDLCALC, TRIG, CHOLHDL, LDLDIRECT in the last 72 hours. Thyroid Function Tests: No results for input(s): TSH, T4TOTAL, T3FREE, THYROIDAB in the last 72 hours.  Invalid input(s): FREET3 Anemia Panel: No results for input(s): VITAMINB12, FOLATE, FERRITIN, TIBC, IRON, RETICCTPCT in the last 72 hours.  DG Finger Thumb Right  Result Date: 04/12/2019 CLINICAL DATA:  Cyanosis of finger tip. No known injury. EXAM: RIGHT THUMB 2+V COMPARISON:  None. FINDINGS: The bones are demineralized. There is a mildly displaced extra-articular fracture through the distal phalanx of the right thumb. There is no evidence of bone destruction or dislocation. Mild degenerative changes are present at the interphalangeal and metacarpal phalangeal joints. There is soft tissue swelling throughout the thumb without evidence of foreign body or soft tissue emphysema. IMPRESSION: Mildly displaced extra-articular fracture of the distal phalanx of the right thumb. Electronically Signed   By: Richardean Sale M.D.   On: 04/12/2019 11:30   ECHOCARDIOGRAM COMPLETE  Result Date: 04/11/2019    ECHOCARDIOGRAM REPORT   Patient Name:   Megan Salinas Date of Exam: 04/11/2019 Medical Rec #:  [id number]       Height:       64.0 in Accession #:    [id number]      Weight:       154.0 lb Date of Birth:  [birth year]       BSA:          1.751 m Patient Age:    57 years        BP:           154/55 mmHg Patient Gender: F               HR:           86 bpm. Exam Location:  [facility] Procedure: 2D Echo, Color Doppler and Cardiac Doppler Indications:     I25.10 CAD Native Vessel  History:         Patient has prior history of Echocardiogram examinations. CHF,                  CAD, Prior CABG, TIA; Risk Factors:Hypertension and HCL.  Sonographer:     Charmayne Sheer RDCS (AE) Referring Phys:  [id number] [name] Diagnosing Phys: Yolonda Kida MD  Sonographer Comments: Suboptimal parasternal window. TDS due to inability to position pt due to broken left hip. IMPRESSIONS  1. Left ventricular ejection fraction, by estimation, is 50 to 55%. Left ventricular ejection fraction by 2D MOD biplane is 54.6 %. The left ventricle has low normal function. The left ventricle demonstrates regional wall motion abnormalities (see  scoring diagram/findings for description). Left ventricular diastolic parameters are consistent with Grade I diastolic dysfunction (impaired relaxation).  2. Right ventricular systolic function is normal. The right ventricular size is normal.  3. The mitral valve is grossly normal. Trivial mitral valve regurgitation.  4. The aortic valve is grossly normal. Aortic valve regurgitation is mild to moderate. Mild aortic valve stenosis. FINDINGS  Left Ventricle: Left ventricular ejection fraction, by estimation, is 50 to 55%. Left ventricular ejection fraction by 2D MOD biplane is 54.6 %. The left ventricle has low normal function. The left ventricle demonstrates regional wall motion abnormalities. The left ventricular internal cavity size was normal in size. There is no left ventricular hypertrophy. Left ventricular diastolic parameters are consistent with Grade I diastolic dysfunction (impaired relaxation). Right Ventricle: The right ventricular size is normal. No increase in right ventricular wall thickness. Right ventricular systolic function is normal. Left Atrium: Left atrial size was normal in size. Right Atrium: Right atrial size was normal in size. Pericardium: There is no evidence of pericardial effusion. Mitral Valve: The mitral valve is grossly normal. Trivial mitral valve regurgitation. MV peak gradient, 9.4 mmHg. The mean mitral valve gradient is 4.0 mmHg. Tricuspid Valve: The tricuspid valve is normal in structure. Tricuspid valve regurgitation is mild. Aortic Valve: The aortic valve is grossly  normal. Aortic valve regurgitation is mild to moderate. Aortic regurgitation PHT measures 443 msec. Mild aortic stenosis is present. Aortic valve mean gradient measures 7.0 mmHg. Aortic valve peak gradient measures 11.7 mmHg. Aortic valve area, by VTI measures 1.99 cm. Pulmonic Valve: The pulmonic valve was normal in structure. Pulmonic valve regurgitation is not visualized. Aorta: The aortic root is normal in size and structure. IAS/Shunts: No atrial level shunt detected by color flow Doppler.  LEFT VENTRICLE PLAX 2D                        Biplane EF (MOD) LVIDd:         2.95 cm         LV Biplane EF:   Left LVIDs:         2.60 cm                          ventricular LV PW:         0.86 cm                          ejection LV IVS:        0.82 cm                          fraction by LVOT diam:     2.10 cm                          2D MOD LV SV:         65                               biplane is LV SV Index:   37                               54.6 %. LVOT Area:     3.46 cm  Diastology                                LV e' lateral:   11.50 cm/s LV Volumes (MOD)               LV E/e' lateral: 9.0 LV vol d, MOD    87.3 ml       LV e' medial:    9.46 cm/s A2C:                           LV E/e' medial:  10.9 LV vol d, MOD    109.0 ml A4C: LV vol s, MOD    52.0 ml A2C: LV vol s, MOD    39.2 ml A4C: LV SV MOD A2C:   35.3 ml LV SV MOD A4C:   109.0 ml LV SV MOD BP:    54.6 ml RIGHT VENTRICLE RV Basal diam:  3.49 cm LEFT ATRIUM             Index       RIGHT ATRIUM           Index LA diam:        3.20 cm 1.83 cm/m  RA Area:     17.70 cm LA Vol (A2C):   61.1 ml 34.90 ml/m RA Volume:   44.90 ml  25.65 ml/m LA Vol (A4C):   38.7 ml 22.11 ml/m LA Biplane Vol: 49.6 ml 28.33 ml/m  AORTIC VALVE                    PULMONIC VALVE AV Area (Vmax):    2.17 cm     PV Vmax:       1.02 m/s AV Area (Vmean):   1.96 cm     PV Vmean:      70.600 cm/s AV Area (VTI):     1.99 cm     PV VTI:        0.178 m AV  Vmax:           171.00 cm/s  PV Peak grad:  4.2 mmHg AV Vmean:          121.000 cm/s PV Mean grad:  2.0 mmHg AV VTI:            0.326 m AV Peak Grad:      11.7 mmHg AV Mean Grad:      7.0 mmHg LVOT Vmax:         107.00 cm/s LVOT Vmean:        68.400 cm/s LVOT VTI:          0.187 m LVOT/AV VTI ratio: 0.57 AI PHT:            443 msec  AORTA Ao Root diam: 3.40 cm MITRAL VALVE MV Area (PHT): 4.80 cm     SHUNTS MV Peak grad:  9.4 mmHg     Systemic VTI:  0.19 m MV Mean grad:  4.0 mmHg     Systemic Diam: 2.10 cm MV Vmax:       1.53 m/s MV Vmean:      96.4 cm/s MV Decel Time: 158 msec MV E velocity: 103.00 cm/s MV A velocity: 149.00 cm/s MV E/A ratio:  0.69 Dwayne D Callwood MD Electronically signed by Yolonda Kida MD Signature Date/Time: 04/11/2019/12:11:46 PM    Final    DG HIP  OPERATIVE UNILAT W OR W/O PELVIS LEFT  Result Date: 04/11/2019 CLINICAL DATA:  Left femur intramedullary rod.  Left hip ORIF. EXAM: OPERATIVE left HIP (WITH PELVIS IF PERFORMED) 5 VIEWS TECHNIQUE: Fluoroscopic spot image(s) were submitted for interpretation post-operatively. COMPARISON:  Left hip radiographs 04/10/2019 FINDINGS: Intraoperative images demonstrate a left femoral intramedullary rod. 2 screws cross the intratrochanteric fracture. Fracture has been reduced. Alignment is anatomic. IMPRESSION: Left hip ORIF without radiographic evidence for complication. Electronically Signed   By: San Morelle M.D.   On: 04/11/2019 15:19     Echo: Low normal LV function with a EF estimated to be 50-55%.  Normal right ventricular systolic function.  Trivial mitral valve regurgitation.  Mild to moderate aortic valve regurgitation and mild aortic valve stenosis.    Principal Problem:   Displaced intertrochanteric fracture of left femur (HCC) Active Problems:   Chronic systolic CHF (congestive heart failure), NYHA class 3 (HCC)   Coronary artery disease   Type 2 diabetes mellitus without complication (HCC)   Closed left hip  fracture, initial encounter ([place])   Preop examination Right thumb fracture   ASSESSMENT AND PLAN:  1. Chronic systolic heart failure, most recent EF= 50-55%, reasonably stable             -Continue Metoprolol and spironolactone   -Strict intake and Output  -Daily weights  -Low-sodium diet   -Encourage frequent use of the incentive spirometer              2. CAD (3 vessel) s/p CABG (2012), reasonably stable             -Continue statin              -Recommend continuous telemetry monitoring  - Continue Lovenox for anticoagulation as ordered by Orthopedics  3. HTN, fairly controlled             -Continue Lisinopril   4. DM Type II, failry controlled, pt is hyperglycemic today             -Agree with current management with sliding scale insulin  5. HLD, reasonably controlled             -Continue simvastatin  6. Displaced intertochanteric fracture of the left femur due to accidental fall             - Managed by Orthopedic Surgery  -Pain management per Orthopedic Surgery   7. Right thumb fracture, stable   -Agree with right thumb splint    Jaquitta Dupriest, ACNPC-AG  04/13/2019 8:09 AM

## 2019-04-14 LAB — BASIC METABOLIC PANEL
Anion gap: 6 (ref 5–15)
BUN: 15 mg/dL (ref 8–23)
CO2: 23 mmol/L (ref 22–32)
Calcium: 8.6 mg/dL — ABNORMAL LOW (ref 8.9–10.3)
Chloride: 105 mmol/L (ref 98–111)
Creatinine, Ser: 0.64 mg/dL (ref 0.44–1.00)
GFR calc Af Amer: >60 mL/min (ref >60)
GFR calc non Af Amer: >60 mL/min (ref >60)
Glucose, Bld: 170 mg/dL — ABNORMAL HIGH (ref 70–99)
Potassium: 4.2 mmol/L (ref 3.5–5.1)
Sodium: 134 mmol/L — ABNORMAL LOW (ref 135–145)

## 2019-04-14 LAB — GLUCOSE, CAPILLARY
Glucose-Capillary: 166 mg/dL — ABNORMAL HIGH (ref 70–99)
Glucose-Capillary: 175 mg/dL — ABNORMAL HIGH (ref 70–99)
Glucose-Capillary: 141 mg/dL — ABNORMAL HIGH (ref 70–99)

## 2019-04-14 LAB — CBC
HCT: 28.2 % — ABNORMAL LOW (ref 36.0–46.0)
Hemoglobin: 9.5 g/dL — ABNORMAL LOW (ref 12.0–15.0)
MCH: 31.5 pg (ref 26.0–34.0)
MCHC: 33.7 g/dL (ref 30.0–36.0)
MCV: 93.4 fL (ref 80.0–100.0)
Platelets: 166 10*3/uL (ref 150–400)
RBC: 3.02 MIL/uL — ABNORMAL LOW (ref 3.87–5.11)
RDW: 14.1 % (ref 11.5–15.5)
WBC: 9.7 10*3/uL (ref 4.0–10.5)
nRBC: 0 % (ref 0.0–0.2)

## 2019-04-14 NOTE — Anesthesia Postprocedure Evaluation (Signed)
Anesthesia Post Note  Patient: Megan Salinas  Procedure(s) Performed: INTRAMEDULLARY (IM) NAIL INTERTROCHANTRIC (Left Hip)  Patient location during evaluation: PACU Anesthesia Type: Combined General/Spinal Level of consciousness: awake and alert Pain management: pain level controlled Vital Signs Assessment: post-procedure vital signs reviewed and stable Respiratory status: spontaneous breathing, nonlabored ventilation, respiratory function stable and patient connected to nasal cannula oxygen Cardiovascular status: blood pressure returned to baseline and stable Postop Assessment: no apparent nausea or vomiting Anesthetic complications: no     Last Vitals:  Vitals:   04/13/19 2329 04/14/19 0839  BP: (!) 132/51 (!) 118/59  Pulse: 82 80  Resp:  18  Temp: 37.6 C 36.7 C  SpO2: 95% 94%    Last Pain:  Vitals:   04/14/19 0839  TempSrc: Oral  PainSc:                  [name]

## 2019-04-14 NOTE — Progress Notes (Signed)
Pt discharging via ems. Daughter at bedside.

## 2019-04-14 NOTE — Progress Notes (Signed)
PROGRESS NOTE                                                                                                                                                                                                             Patient Demographics:    Megan Salinas, is a 86 y.o. female, DOB - 1926-12-11, [id number]  Admit date - 04/10/2019   Admitting Physician [name], MD  Outpatient Primary MD for the patient is [name], MD  LOS - 4    Chief Complaint  Patient presents with  . Fall       Brief Narrative 86 year old female admitted with mechanical fall and left hip fracture.  S/p repair   Subjective:   Left hip pain better controlled today.  Still has not had a bowel movement.  Assessment  & Plan :    Principal Problem:   Displaced intertrochanteric fracture of left femur (HCC) IM nailing on 3/29.  Pain control with Vicodin and Robaxin for muscle spasms.  Bowel regimen.  DVT prophylaxis with subcu Lovenox.  Follow-up with Ortho.  Active Problems: Chronic systolic CHF/CAD Euvolemic.  Continue metoprolol, Aldactone and statin.  Type 2 diabetes mellitus without complication Stable on sliding scale coverage.    Patient stable for discharge to SNF.  Discussed with daughter at length.  Lab Results  Component Value Date   PLT 166 04/14/2019    Antibiotics  :    Anti-infectives (From admission, onward)   Start     Dose/Rate Route Frequency Ordered Stop   04/11/19 2000  ceFAZolin (ANCEF) IVPB 1 g/50 mL premix     1 g 100 mL/hr over 30 Minutes Intravenous Every 6 hours 04/11/19 1657 04/12/19 1359   04/10/19 2230  ceFAZolin (ANCEF) IVPB 1 g/50 mL premix     1 g 100 mL/hr over 30 Minutes Intravenous  Once 04/10/19 2220 04/11/19 1359        Objective:   Vitals:   04/13/19 2216 04/13/19 2329 04/14/19 0839 04/14/19 1200  BP: (!) 132/55 (!) 132/51 (!) 118/59   Pulse: 89 82 80   Resp:   18     Temp:  99.7 F (37.6 C) 98 F (36.7 C)   TempSrc:  Oral Oral   SpO2:  95% 94%   Weight:    77.6 kg  Wt Readings from Last 3 Encounters:  04/14/19 77.6 kg  08/27/18 69.9 kg  08/24/18 70.8 kg     Intake/Output Summary (Last 24 hours) at 04/14/2019 1217 Last data filed at 04/14/2019 1048 Gross per 24 hour  Intake 240 ml  Output 700 ml  Net -460 ml     Physical Exam  Gen: not in distress HEENT:  moist mucosa, supple neck Chest: clear b/l, no added sounds CVS: N S1&S2, no murmurs, GI: soft, NT, ND, Musculoskeletal: warm, clean dressing over left hip, right thumb splint     Data Review:    CBC Recent Labs  Lab 04/10/19 2245 04/12/19 0534 04/13/19 0531 04/14/19 0448  WBC 11.0* 9.6 10.1 9.7  HGB 12.4 9.9* 9.6* 9.5*  HCT 37.1 29.6* 29.2* 28.2*  PLT 231 183 165 166  MCV 93.5 93.7 95.4 93.4  MCH 31.2 31.3 31.4 31.5  MCHC 33.4 33.4 32.9 33.7  RDW 14.0 14.4 14.5 14.1  LYMPHSABS 1.5  --   --   --   MONOABS 0.5  --   --   --   EOSABS 0.2  --   --   --   BASOSABS 0.0  --   --   --     Chemistries  Recent Labs  Lab 04/10/19 2245 04/12/19 0534 04/13/19 0531 04/14/19 0448  NA 137 137 136 134*  K 3.8 4.4 4.5 4.2  CL 106 108 107 105  CO2 22 21* 23 23  GLUCOSE 177* 185* 167* 170*  BUN 13 13 14 15   CREATININE 0.66 0.62 0.65 0.64  CALCIUM 9.6 9.0 8.5* 8.6*   ------------------------------------------------------------------------------------------------------------------ No results for input(s): CHOL, HDL, LDLCALC, TRIG, CHOLHDL, LDLDIRECT in the last 72 hours.  Lab Results  Component Value Date   HGBA1C 6.8 (H) 04/10/2019   ------------------------------------------------------------------------------------------------------------------ No results for input(s): TSH, T4TOTAL, T3FREE, THYROIDAB in the last 72 hours.  Invalid input(s):  FREET3 ------------------------------------------------------------------------------------------------------------------ No results for input(s): VITAMINB12, FOLATE, FERRITIN, TIBC, IRON, RETICCTPCT in the last 72 hours.  Coagulation profile Recent Labs  Lab 04/10/19 2245  INR 1.1    No results for input(s): DDIMER in the last 72 hours.  Cardiac Enzymes No results for input(s): CKMB, TROPONINI, MYOGLOBIN in the last 168 hours.  Invalid input(s): CK ------------------------------------------------------------------------------------------------------------------ No results found for: BNP  Inpatient Medications  Scheduled Meds: . docusate sodium  100 mg Oral BID  . enoxaparin (LOVENOX) injection  30 mg Subcutaneous Q24H  . famotidine  20 mg Oral Daily  . insulin aspart  0-15 Units Subcutaneous TID WC  . lisinopril  20 mg Oral Daily  . metoprolol tartrate  12.5 mg Oral BID  . multivitamin with minerals  1 tablet Oral Daily  . mupirocin ointment  1 application Nasal BID  . pantoprazole  40 mg Oral BID  . Ensure Max Protein  11 oz Oral BID BM  . simvastatin  40 mg Oral q1800  . spironolactone  25 mg Oral Daily   Continuous Infusions: . methocarbamol (ROBAXIN) IV Stopped (04/13/19 0700)   PRN Meds:.acetaminophen, bisacodyl, HYDROcodone-acetaminophen, HYDROmorphone (DILAUDID) injection, methocarbamol (ROBAXIN) IV, metoCLOPramide **OR** metoCLOPramide (REGLAN) injection, ondansetron **OR** ondansetron (ZOFRAN) IV, senna-docusate, senna-docusate, sodium phosphate  Micro Results Recent Results (from the past 240 hour(s))  Respiratory Panel by RT PCR (Flu A&B, Covid) - Nasopharyngeal Swab     Status: None   Collection Time: 04/10/19 10:45 PM   Specimen: Nasopharyngeal Swab  Result Value Ref Range Status   SARS Coronavirus 2 by RT PCR NEGATIVE  NEGATIVE Final    Comment: (NOTE) SARS-CoV-2 target nucleic acids are NOT DETECTED. The SARS-CoV-2 RNA is generally detectable in upper  respiratoy specimens during the acute phase of infection. The lowest concentration of SARS-CoV-2 viral copies this assay can detect is 131 copies/mL. A negative result does not preclude SARS-Cov-2 infection and should not be used as the sole basis for treatment or other patient management decisions. A negative result may occur with  improper specimen collection/handling, submission of specimen other than nasopharyngeal swab, presence of viral mutation(s) within the areas targeted by this assay, and inadequate number of viral copies (<131 copies/mL). A negative result must be combined with clinical observations, patient history, and epidemiological information. The expected result is Negative. Fact Sheet for Patients:  PinkCheek.be Fact Sheet for Healthcare Providers:  GravelBags.it This test is not yet ap proved or cleared by the [place] FDA and  has been authorized for detection and/or diagnosis of SARS-CoV-2 by FDA under an Emergency Use Authorization (EUA). This EUA will remain  in effect (meaning this test can be used) for the duration of the COVID-19 declaration under Section 564(b)(1) of the Act, 21 U.S.C. section 360bbb-3(b)(1), unless the authorization is terminated or revoked sooner.    Influenza A by PCR NEGATIVE NEGATIVE Final   Influenza B by PCR NEGATIVE NEGATIVE Final    Comment: (NOTE) The Xpert Xpress SARS-CoV-2/FLU/RSV assay is intended as an aid in  the diagnosis of influenza from Nasopharyngeal swab specimens and  should not be used as a sole basis for treatment. Nasal washings and  aspirates are unacceptable for Xpert Xpress SARS-CoV-2/FLU/RSV  testing. Fact Sheet for Patients: PinkCheek.be Fact Sheet for Healthcare Providers: GravelBags.it This test is not yet approved or cleared by the [place] FDA and  has been authorized for  detection and/or diagnosis of SARS-CoV-2 by  FDA under an Emergency Use Authorization (EUA). This EUA will remain  in effect (meaning this test can be used) for the duration of the  Covid-19 declaration under Section 564(b)(1) of the Act, 21  U.S.C. section 360bbb-3(b)(1), unless the authorization is  terminated or revoked. Performed at [facility], [address] [place], [place] [address]   Surgical pcr screen     Status: Abnormal   Collection Time: 04/11/19  3:01 AM   Specimen: Nasal Mucosa; Nasal Swab  Result Value Ref Range Status   MRSA, PCR POSITIVE (A) NEGATIVE Final    Comment: RESULT CALLED TO, READ BACK BY AND VERIFIED WITH: TRICIA KING 04/11/19 AT 0443 HS    Staphylococcus aureus POSITIVE (A) NEGATIVE Final    Comment: (NOTE) The Xpert SA Assay (FDA approved for NASAL specimens in patients 75 years of age and older), is one component of a comprehensive surveillance program. It is not intended to diagnose infection nor to guide or monitor treatment. Performed at [facility], [address] [place], [place] [address]   Respiratory Panel by RT PCR (Flu A&B, Covid) - Nasopharyngeal Swab     Status: None   Collection Time: 04/13/19  3:04 PM   Specimen: Nasopharyngeal Swab  Result Value Ref Range Status   SARS Coronavirus 2 by RT PCR NEGATIVE NEGATIVE Final    Comment: (NOTE) SARS-CoV-2 target nucleic acids are NOT DETECTED. The SARS-CoV-2 RNA is generally detectable in upper respiratoy specimens during the acute phase of infection. The lowest concentration of SARS-CoV-2 viral copies this assay can detect is 131 copies/mL. A negative result does not preclude SARS-Cov-2 infection and should not be  used as the sole basis for treatment or other patient management decisions. A negative result may occur with  improper specimen collection/handling, submission of specimen other than nasopharyngeal swab, presence of viral mutation(s) within the areas  targeted by this assay, and inadequate number of viral copies (<131 copies/mL). A negative result must be combined with clinical observations, patient history, and epidemiological information. The expected result is Negative. Fact Sheet for Patients:  PinkCheek.be Fact Sheet for Healthcare Providers:  GravelBags.it This test is not yet ap proved or cleared by the [place] FDA and  has been authorized for detection and/or diagnosis of SARS-CoV-2 by FDA under an Emergency Use Authorization (EUA). This EUA will remain  in effect (meaning this test can be used) for the duration of the COVID-19 declaration under Section 564(b)(1) of the Act, 21 U.S.C. section 360bbb-3(b)(1), unless the authorization is terminated or revoked sooner.    Influenza A by PCR NEGATIVE NEGATIVE Final   Influenza B by PCR NEGATIVE NEGATIVE Final    Comment: (NOTE) The Xpert Xpress SARS-CoV-2/FLU/RSV assay is intended as an aid in  the diagnosis of influenza from Nasopharyngeal swab specimens and  should not be used as a sole basis for treatment. Nasal washings and  aspirates are unacceptable for Xpert Xpress SARS-CoV-2/FLU/RSV  testing. Fact Sheet for Patients: PinkCheek.be Fact Sheet for Healthcare Providers: GravelBags.it This test is not yet approved or cleared by the [place] FDA and  has been authorized for detection and/or diagnosis of SARS-CoV-2 by  FDA under an Emergency Use Authorization (EUA). This EUA will remain  in effect (meaning this test can be used) for the duration of the  Covid-19 declaration under Section 564(b)(1) of the Act, 21  U.S.C. section 360bbb-3(b)(1), unless the authorization is  terminated or revoked. Performed at [facility], [address], [place], [place] [address]     Radiology Reports CT Head Wo Contrast  Result Date:  04/10/2019 CLINICAL DATA:  Recent fall with headaches and neck pain, initial encounter EXAM: CT HEAD WITHOUT CONTRAST CT CERVICAL SPINE WITHOUT CONTRAST TECHNIQUE: Multidetector CT imaging of the head and cervical spine was performed following the standard protocol without intravenous contrast. Multiplanar CT image reconstructions of the cervical spine were also generated. COMPARISON:  01/28/2016, 01/14/2018 FINDINGS: CT HEAD FINDINGS Brain: No evidence of acute infarction, hemorrhage, hydrocephalus, extra-axial collection or mass lesion/mass effect. Chronic atrophic and ischemic changes are again noted and stable. Vascular: No hyperdense vessel or unexpected calcification. Skull: Normal. Negative for fracture or focal lesion. Sinuses/Orbits: Mucosal retention cyst is noted in the left maxillary antrum stable from the prior exams. The paranasal sinuses are otherwise within normal limits. Mastoid air cells on the left demonstrate opacification. This is stable from the prior exam of 2020. Other: None. CT CERVICAL SPINE FINDINGS Alignment: Within normal limits. Skull base and vertebrae: 7 cervical segments are well visualized. Vertebral body height is well maintained. Multilevel facet hypertrophic changes and osteophytic changes are seen. No acute fracture or acute facet abnormality is noted. Neural foraminal narrowing is noted bilaterally. Soft tissues and spinal canal: Scattered calcifications and hypodense nodular changes are noted throughout the thyroid. None of these are significant by size criteria. The remaining soft tissues of the neck are within normal limits. Upper chest: Visualized lung apices are within normal limits. Other: None IMPRESSION: CT of the head: Chronic atrophic and ischemic changes without acute intracranial abnormality. Chronic left mastoid opacification CT of the cervical spine: Multilevel degenerative changes without acute abnormality. Multiple small hypodensities  and calcifications noted  throughout the thyroid. No follow-up recommended unless clinically warranted (ref: J Am Coll Radiol. 2015 Feb;12(2): 143-50). Electronically Signed   By: Inez Catalina M.D.   On: 04/10/2019 21:35   CT Cervical Spine Wo Contrast  Result Date: 04/10/2019 CLINICAL DATA:  Recent fall with headaches and neck pain, initial encounter EXAM: CT HEAD WITHOUT CONTRAST CT CERVICAL SPINE WITHOUT CONTRAST TECHNIQUE: Multidetector CT imaging of the head and cervical spine was performed following the standard protocol without intravenous contrast. Multiplanar CT image reconstructions of the cervical spine were also generated. COMPARISON:  01/28/2016, 01/14/2018 FINDINGS: CT HEAD FINDINGS Brain: No evidence of acute infarction, hemorrhage, hydrocephalus, extra-axial collection or mass lesion/mass effect. Chronic atrophic and ischemic changes are again noted and stable. Vascular: No hyperdense vessel or unexpected calcification. Skull: Normal. Negative for fracture or focal lesion. Sinuses/Orbits: Mucosal retention cyst is noted in the left maxillary antrum stable from the prior exams. The paranasal sinuses are otherwise within normal limits. Mastoid air cells on the left demonstrate opacification. This is stable from the prior exam of 2020. Other: None. CT CERVICAL SPINE FINDINGS Alignment: Within normal limits. Skull base and vertebrae: 7 cervical segments are well visualized. Vertebral body height is well maintained. Multilevel facet hypertrophic changes and osteophytic changes are seen. No acute fracture or acute facet abnormality is noted. Neural foraminal narrowing is noted bilaterally. Soft tissues and spinal canal: Scattered calcifications and hypodense nodular changes are noted throughout the thyroid. None of these are significant by size criteria. The remaining soft tissues of the neck are within normal limits. Upper chest: Visualized lung apices are within normal limits. Other: None IMPRESSION: CT of the head: Chronic  atrophic and ischemic changes without acute intracranial abnormality. Chronic left mastoid opacification CT of the cervical spine: Multilevel degenerative changes without acute abnormality. Multiple small hypodensities and calcifications noted throughout the thyroid. No follow-up recommended unless clinically warranted (ref: J Am Coll Radiol. 2015 Feb;12(2): 143-50). Electronically Signed   By: Inez Catalina M.D.   On: 04/10/2019 21:35   DG Chest Port 1 View  Result Date: 04/10/2019 CLINICAL DATA:  Preop EXAM: PORTABLE CHEST 1 VIEW COMPARISON:  02/28/2017 FINDINGS: Increased interstitial markings. This appearance favors chronic interstitial lung disease over interstitial edema or mild pneumonia. Mild right lower lung opacity favors atelectasis. No pleural effusion or pneumothorax. Cardiomegaly. Postsurgical changes related to prior CABG. Thoracic aortic atherosclerosis. Degenerative changes of the bilateral shoulders. IMPRESSION: Increased interstitial markings, favoring chronic interstitial lung disease, with mild right lower lobe atelectasis. Electronically Signed   By: Julian Hy M.D.   On: 04/10/2019 22:28   DG Finger Thumb Right  Result Date: 04/12/2019 CLINICAL DATA:  Cyanosis of finger tip. No known injury. EXAM: RIGHT THUMB 2+V COMPARISON:  None. FINDINGS: The bones are demineralized. There is a mildly displaced extra-articular fracture through the distal phalanx of the right thumb. There is no evidence of bone destruction or dislocation. Mild degenerative changes are present at the interphalangeal and metacarpal phalangeal joints. There is soft tissue swelling throughout the thumb without evidence of foreign body or soft tissue emphysema. IMPRESSION: Mildly displaced extra-articular fracture of the distal phalanx of the right thumb. Electronically Signed   By: Richardean Sale M.D.   On: 04/12/2019 11:30   ECHOCARDIOGRAM COMPLETE  Result Date: 04/11/2019    ECHOCARDIOGRAM REPORT   Patient  Name:   KEALIA MCCUEN Date of Exam: 04/11/2019 Medical Rec #:  [id number]       Height:  64.0 in Accession #:    [id number]      Weight:       154.0 lb Date of Birth:  1926-11-02       BSA:          1.751 m Patient Age:    55 years        BP:           154/55 mmHg Patient Gender: F               HR:           86 bpm. Exam Location:  [facility] Procedure: 2D Echo, Color Doppler and Cardiac Doppler Indications:     I25.10 CAD Native Vessel  History:         Patient has prior history of Echocardiogram examinations. CHF,                  CAD, Prior CABG, TIA; Risk Factors:Hypertension and HCL.  Sonographer:     Charmayne Sheer RDCS (AE) Referring Phys:  [id number] [name] Diagnosing Phys: Yolonda Kida MD  Sonographer Comments: Suboptimal parasternal window. TDS due to inability to position pt due to broken left hip. IMPRESSIONS  1. Left ventricular ejection fraction, by estimation, is 50 to 55%. Left ventricular ejection fraction by 2D MOD biplane is 54.6 %. The left ventricle has low normal function. The left ventricle demonstrates regional wall motion abnormalities (see scoring diagram/findings for description). Left ventricular diastolic parameters are consistent with Grade I diastolic dysfunction (impaired relaxation).  2. Right ventricular systolic function is normal. The right ventricular size is normal.  3. The mitral valve is grossly normal. Trivial mitral valve regurgitation.  4. The aortic valve is grossly normal. Aortic valve regurgitation is mild to moderate. Mild aortic valve stenosis. FINDINGS  Left Ventricle: Left ventricular ejection fraction, by estimation, is 50 to 55%. Left ventricular ejection fraction by 2D MOD biplane is 54.6 %. The left ventricle has low normal function. The left ventricle demonstrates regional wall motion abnormalities. The left ventricular internal cavity size was normal in size. There is no left ventricular hypertrophy. Left ventricular diastolic parameters are consistent  with Grade I diastolic dysfunction (impaired relaxation). Right Ventricle: The right ventricular size is normal. No increase in right ventricular wall thickness. Right ventricular systolic function is normal. Left Atrium: Left atrial size was normal in size. Right Atrium: Right atrial size was normal in size. Pericardium: There is no evidence of pericardial effusion. Mitral Valve: The mitral valve is grossly normal. Trivial mitral valve regurgitation. MV peak gradient, 9.4 mmHg. The mean mitral valve gradient is 4.0 mmHg. Tricuspid Valve: The tricuspid valve is normal in structure. Tricuspid valve regurgitation is mild. Aortic Valve: The aortic valve is grossly normal. Aortic valve regurgitation is mild to moderate. Aortic regurgitation PHT measures 443 msec. Mild aortic stenosis is present. Aortic valve mean gradient measures 7.0 mmHg. Aortic valve peak gradient measures 11.7 mmHg. Aortic valve area, by VTI measures 1.99 cm. Pulmonic Valve: The pulmonic valve was normal in structure. Pulmonic valve regurgitation is not visualized. Aorta: The aortic root is normal in size and structure. IAS/Shunts: No atrial level shunt detected by color flow Doppler.  LEFT VENTRICLE PLAX 2D                        Biplane EF (MOD) LVIDd:         2.95 cm         LV Biplane  EF:   Left LVIDs:         2.60 cm                          ventricular LV PW:         0.86 cm                          ejection LV IVS:        0.82 cm                          fraction by LVOT diam:     2.10 cm                          2D MOD LV SV:         65                               biplane is LV SV Index:   37                               54.6 %. LVOT Area:     3.46 cm                                Diastology                                LV e' lateral:   11.50 cm/s LV Volumes (MOD)               LV E/e' lateral: 9.0 LV vol d, MOD    87.3 ml       LV e' medial:    9.46 cm/s A2C:                           LV E/e' medial:  10.9 LV vol d, MOD    109.0 ml  A4C: LV vol s, MOD    52.0 ml A2C: LV vol s, MOD    39.2 ml A4C: LV SV MOD A2C:   35.3 ml LV SV MOD A4C:   109.0 ml LV SV MOD BP:    54.6 ml RIGHT VENTRICLE RV Basal diam:  3.49 cm LEFT ATRIUM             Index       RIGHT ATRIUM           Index LA diam:        3.20 cm 1.83 cm/m  RA Area:     17.70 cm LA Vol (A2C):   61.1 ml 34.90 ml/m RA Volume:   44.90 ml  25.65 ml/m LA Vol (A4C):   38.7 ml 22.11 ml/m LA Biplane Vol: 49.6 ml 28.33 ml/m  AORTIC VALVE                    PULMONIC VALVE AV Area (Vmax):    2.17 cm     PV Vmax:       1.02 m/s AV Area (Vmean):   1.96 cm     PV Vmean:  70.600 cm/s AV Area (VTI):     1.99 cm     PV VTI:        0.178 m AV Vmax:           171.00 cm/s  PV Peak grad:  4.2 mmHg AV Vmean:          121.000 cm/s PV Mean grad:  2.0 mmHg AV VTI:            0.326 m AV Peak Grad:      11.7 mmHg AV Mean Grad:      7.0 mmHg LVOT Vmax:         107.00 cm/s LVOT Vmean:        68.400 cm/s LVOT VTI:          0.187 m LVOT/AV VTI ratio: 0.57 AI PHT:            443 msec  AORTA Ao Root diam: 3.40 cm MITRAL VALVE MV Area (PHT): 4.80 cm     SHUNTS MV Peak grad:  9.4 mmHg     Systemic VTI:  0.19 m MV Mean grad:  4.0 mmHg     Systemic Diam: 2.10 cm MV Vmax:       1.53 m/s MV Vmean:      96.4 cm/s MV Decel Time: 158 msec MV E velocity: 103.00 cm/s MV A velocity: 149.00 cm/s MV E/A ratio:  0.69 Dwayne D Callwood MD Electronically signed by Yolonda Kida MD Signature Date/Time: 04/11/2019/12:11:46 PM    Final    DG HIP OPERATIVE UNILAT W OR W/O PELVIS LEFT  Result Date: 04/11/2019 CLINICAL DATA:  Left femur intramedullary rod.  Left hip ORIF. EXAM: OPERATIVE left HIP (WITH PELVIS IF PERFORMED) 5 VIEWS TECHNIQUE: Fluoroscopic spot image(s) were submitted for interpretation post-operatively. COMPARISON:  Left hip radiographs 04/10/2019 FINDINGS: Intraoperative images demonstrate a left femoral intramedullary rod. 2 screws cross the intratrochanteric fracture. Fracture has been reduced. Alignment is  anatomic. IMPRESSION: Left hip ORIF without radiographic evidence for complication. Electronically Signed   By: San Morelle M.D.   On: 04/11/2019 15:19   DG HIP UNILAT WITH PELVIS 2-3 VIEWS LEFT  Result Date: 04/10/2019 CLINICAL DATA:  Pain EXAM: DG HIP (WITH OR WITHOUT PELVIS) 2-3V LEFT COMPARISON:  None. FINDINGS: There is an acute displaced intratrochanteric fracture of the proximal left femur. Multiple displaced fracture fragments are noted. There is diffuse osteopenia. There are degenerative changes of both hips. There is no dislocation. Vascular calcifications are noted. IMPRESSION: Acute displaced intratrochanteric fracture of the proximal left femur. Electronically Signed   By: Constance Holster M.D.   On: 04/10/2019 21:53    Time Spent in minutes  25   Aaliyana Fredericks M.D on 04/14/2019 at 12:17 PM  Between 7am to 7pm - Pager - [phone]  After 7pm go to [email or web address] - password [email or web address]  [facility] -  Office  [phone]

## 2019-04-14 NOTE — Progress Notes (Signed)
[facility]  Patient Description: Megan Salinas is a 86 year old Caucasian female with PMH significant for CAD s/p CABG (0000000), chronic systolic heart failure with an EF of 45%-50%, hypertension, hyperlipidemia, DM Type II, TIA, GERD and back pain who suffered a fall in her garage on 04/10/2019 and due to the patient's extensive cardiac history, cardiology was consulted for pre-operative clearance. The patient    underwent Intramedullary nailing of the left femur on 04/12/19.  SUBJECTIVE:  (04/12/19)The patient reports to be feeling fairly well but complains of fatigue and intermittent muscle spasms in the left leg.  The patient denies any shortness of breath, chest pain, palpitations, unilateral weakness, dizziness or syncope at this time.  (04/13/19)The patient reports to be feeling slightly better than yesterday and that she was able to sleep well on last night. She states that she would like to receive her pain medication closer to the time of her therapy due to the surgical pain. She also expresses concerns over her left knee as she has had issues with it prior to her fall and feel as if the left knee may prevent her from fully recovering and being active again.  The patient denies any chest pain, dyspnea, dizziness, fatigue, syncope or peripheral edema.   (04/14/19) The patient reports to be feeling much better, but states that she did not sleep well on last night. The patient reports that she exeperienced episodes of dizziness when working with PT on yesterday, but she attributes this to the oxycodone pain medication in which she was given. She states that she did not like the way it made her feel; therefore, her pain medication was switched to Norco instead. The patient denies any chest pain, dyspnea, palpitations or fatigue.   OBJECTIVE:  (04/12/19)The patient appears to be doing fairly well and is resting comfortably in bed.  Echocardiogram was obtained on yesterday and it revealed some improvement  with an EF of 50-55%, otherwise findings are fairly consistent with previous 2019 study. The patient is noted to have +2 radial pulses bilaterally and +2 pedal pulses bilaterally.  The patient brought to my attention her right thumb which appears to be severely edematous with signs of cyanosis.   (04/13/19) The patient appears euvolemic, without any signs of distress  and seems to be doing much better on today. She is resting comfortably in a low-bed and has an purwick in place for urinary collection. The patient now has a right thumb splint in place.    (04/14/19) The patient appears to be doing much better on today. She continues to be euvolemic and without any signs of distress. The patient's daughter is at the bedside and the plan is for the patient to be discharged to a SNF for continued rehabilitation.    Vitals:   04/13/19 1618 04/13/19 2216 04/13/19 2329 04/14/19 0839  BP: (!) 134/51 (!) 132/55 (!) 132/51 (!) 118/59  Pulse: 94 89 82 80  Resp: 16   18  Temp: 98.1 F (36.7 C)  99.7 F (37.6 C) 98 F (36.7 C)  TempSrc: Oral  Oral Oral  SpO2: 97%  95% 94%     Intake/Output Summary (Last 24 hours) at 04/14/2019 0844 Last data filed at 04/14/2019 0436 Gross per 24 hour  Intake --  Output 700 ml  Net -700 ml    PHYSICAL EXAM  General: Well developed, well nourished, in no acute distress HEENT:  Normocephalic and atraumatic Neck:  No JVD.  Lungs: Clear bilaterally to auscultation  Heart: HRRR .  Normal S1 and S2 without gallops. Soft murmur  Abdomen: Bowel sounds are positive, abdomen soft and non-tender  Msk:  Normal strength in BUE for age. Diminished strength in LLE. Normal strength for age in RLE. Extremities: mild, non-pitting edema in BLE. No clubbing. Right thumb remains in splint.    Neuro: Alert and oriented X 3. Psych:  Good affect, responds appropriately   LABS: Basic Metabolic Panel: Recent Labs    04/13/19 0531 04/14/19 0448  NA 136 134*  K 4.5 4.2  CL 107 105   CO2 23 23  GLUCOSE 167* 170*  BUN 14 15  CREATININE 0.65 0.64  CALCIUM 8.5* 8.6*   Liver Function Tests: No results for input(s): AST, ALT, ALKPHOS, BILITOT, PROT, ALBUMIN in the last 72 hours. No results for input(s): LIPASE, AMYLASE in the last 72 hours. CBC: Recent Labs    04/13/19 0531 04/14/19 0448  WBC 10.1 9.7  HGB 9.6* 9.5*  HCT 29.2* 28.2*  MCV 95.4 93.4  PLT 165 166   Cardiac Enzymes: No results for input(s): CKTOTAL, CKMB, CKMBINDEX, TROPONINI in the last 72 hours. BNP: Invalid input(s): POCBNP D-Dimer: No results for input(s): DDIMER in the last 72 hours. Hemoglobin A1C: No results for input(s): HGBA1C in the last 72 hours. Fasting Lipid Panel: No results for input(s): CHOL, HDL, LDLCALC, TRIG, CHOLHDL, LDLDIRECT in the last 72 hours. Thyroid Function Tests: No results for input(s): TSH, T4TOTAL, T3FREE, THYROIDAB in the last 72 hours.  Invalid input(s): FREET3 Anemia Panel: No results for input(s): VITAMINB12, FOLATE, FERRITIN, TIBC, IRON, RETICCTPCT in the last 72 hours.  DG Finger Thumb Right  Result Date: 04/12/2019 CLINICAL DATA:  Cyanosis of finger tip. No known injury. EXAM: RIGHT THUMB 2+V COMPARISON:  None. FINDINGS: The bones are demineralized. There is a mildly displaced extra-articular fracture through the distal phalanx of the right thumb. There is no evidence of bone destruction or dislocation. Mild degenerative changes are present at the interphalangeal and metacarpal phalangeal joints. There is soft tissue swelling throughout the thumb without evidence of foreign body or soft tissue emphysema. IMPRESSION: Mildly displaced extra-articular fracture of the distal phalanx of the right thumb. Electronically Signed   By: Richardean Sale M.D.   On: 04/12/2019 11:30     Echo: Low normal LV function with a EF estimated to be 50-55%.  Normal right ventricular systolic function.  Trivial mitral valve regurgitation.  Mild to moderate aortic valve  regurgitation and mild aortic valve stenosis.    Principal Problem:   Displaced intertrochanteric fracture of left femur (HCC) Active Problems:   Chronic systolic CHF (congestive heart failure), NYHA class 3 (HCC)   Coronary artery disease   GERD (gastroesophageal reflux disease)   Lumbar stenosis with neurogenic claudication   Type 2 diabetes mellitus without complication (HCC) Right thumb fracture   ASSESSMENT AND PLAN:  1. Chronic systolic heart failure, most recent EF= 50-55%, reasonably stable             -Continue Metoprolol and spironolactone   -Strict intake and Output  -Daily weights  -Low-sodium diet   -Encourage frequent use of the incentive spirometer              2. CAD (3 vessel) s/p CABG (2012), reasonably stable             -Continue statin              -Recommend continuous telemetry monitoring  - Continue Lovenox for anticoagulation as ordered by Orthopedics  3. HTN, fairly controlled             -Continue Lisinopril   4. DM Type II, failry controlled, pt is hyperglycemic today             -Agree with current management with sliding scale insulin  5. HLD, reasonably controlled             -Continue simvastatin  6. Displaced intertochanteric fracture of the left femur due to accidental fall             - Managed by Orthopedic Surgery  -Pain management per Orthopedic Surgery  - PT recommends discharge to SNF    7. Right thumb fracture, stable   -Agree with right thumb splint    Zhana Jeangilles, ACNPC-AG  04/14/2019 8:44 AM

## 2019-04-14 NOTE — Plan of Care (Signed)
  Problem: Education: Goal: Knowledge of General Education information will improve Description: Including pain rating scale, medication(s)/side effects and non-pharmacologic comfort measures Outcome: Progressing   Problem: Clinical Measurements: Goal: Ability to maintain clinical measurements within normal limits will improve Outcome: Progressing Goal: Will remain free from infection Outcome: Progressing Goal: Diagnostic test results will improve Outcome: Progressing Goal: Respiratory complications will improve Outcome: Progressing Goal: Cardiovascular complication will be avoided Outcome: Progressing   Problem: Activity: Goal: Risk for activity intolerance will decrease Outcome: Progressing   Problem: Coping: Goal: Level of anxiety will decrease Outcome: Progressing   Problem: Pain Managment: Goal: General experience of comfort will improve Outcome: Progressing   Problem: Safety: Goal: Ability to remain free from injury will improve Outcome: Progressing   Problem: Activity: Goal: Ability to ambulate and perform ADLs will improve Outcome: Progressing

## 2019-04-14 NOTE — Progress Notes (Signed)
  Subjective: 3 Days Post-Op Procedure(s) (LRB): INTRAMEDULLARY (IM) NAIL INTERTROCHANTRIC (Left) Patient reports pain as moderate.  She is having occasional spasms in the left thigh. Patient is well, and has had no acute complaints or problems Plan is to go Rehab after hospital stay. Negative for chest pain and shortness of breath Fever: no Gastrointestinal: Negative for nausea and vomiting  Objective: Vital signs in last 24 hours: Temp:  [98.1 F (36.7 C)-99.7 F (37.6 C)] 99.7 F (37.6 C) (03/31 2329) Pulse Rate:  [82-95] 82 (03/31 2329) Resp:  [16] 16 (03/31 1618) BP: (127-134)/(51-58) 132/51 (03/31 2329) SpO2:  [93 %-97 %] 95 % (03/31 2329)  Intake/Output from previous day:  Intake/Output Summary (Last 24 hours) at 04/14/2019 0701 Last data filed at 04/14/2019 0436 Gross per 24 hour  Intake --  Output 700 ml  Net -700 ml    Intake/Output this shift: No intake/output data recorded.  Labs: Recent Labs    04/12/19 0534 04/13/19 0531 04/14/19 0448  HGB 9.9* 9.6* 9.5*   Recent Labs    04/13/19 0531 04/14/19 0448  WBC 10.1 9.7  RBC 3.06* 3.02*  HCT 29.2* 28.2*  PLT 165 166   Recent Labs    04/13/19 0531 04/14/19 0448  NA 136 134*  K 4.5 4.2  CL 107 105  CO2 23 23  BUN 14 15  CREATININE 0.65 0.64  GLUCOSE 167* 170*  CALCIUM 8.5* 8.6*   No results for input(s): LABPT, INR in the last 72 hours.   EXAM General - Patient is Alert and Oriented Extremity - Neurovascular intact Sensation intact distally Dorsiflexion/Plantar flexion intact Compartment soft  Right thumb splint in place.  Able to move her other fingers and hand without thumb discomfort now.  Decreasing edema. Dressing/Incision - clean, dry, no drainage Motor Function - intact, moving foot and toes well on exam.   Past Medical History:  Diagnosis Date  . Back pain   . CAD (coronary artery disease)   . CHF (congestive heart failure) ([place])   . GERD (gastroesophageal reflux disease)   .  High cholesterol   . Hypertension   . TIA (transient ischemic attack) 01/2016   "possible", no deficits  . Wears hearing aid    bilateral    Assessment/Plan: 3 Days Post-Op Procedure(s) (LRB): INTRAMEDULLARY (IM) NAIL INTERTROCHANTRIC (Left) Principal Problem:   Displaced intertrochanteric fracture of left femur (HCC) Active Problems:   Chronic systolic CHF (congestive heart failure), NYHA class 3 (HCC)   Coronary artery disease   GERD (gastroesophageal reflux disease)   Lumbar stenosis with neurogenic claudication   Type 2 diabetes mellitus without complication (HCC)  Estimated body mass index is 26.43 kg/m as calculated from the following:   Height as of 08/27/18: 5\' 4"  (1.626 m).   Weight as of 08/27/18: 69.9 kg. Advance diet Up with therapy D/C IV fluids Discharge to SNF per medicine. Follow-up at clinical clinic in 2 weeks for staple removal. Continue splint on right thumb for protection.  Nonsurgical after x-rays.  DVT Prophylaxis - Lovenox, Foot Pumps and TED hose Weight-Bearing as tolerated to left leg  Reche Dixon, PA-C Orthopaedic Surgery 04/14/2019, 7:01 AM

## 2019-04-14 NOTE — Progress Notes (Signed)
Called report to [place] at [facility]). EMS called for transport.

## 2019-04-14 NOTE — Progress Notes (Signed)
Physical Therapy Treatment Patient Details Name: Megan Salinas MRN: [id number] DOB: 15-Apr-1926 Today's Date: 04/14/2019    History of Present Illness Pt is a 86 y/o F s/p L hip IM nail (04/11/2019) following a left intertrochanteric hip fracture from an accidental fall at home on 04/10/2019. PMH includes: CAD s/p CAGB, CHF, DM, TIA, HLD, HTN, and GERD    PT Comments    Pt pleasant and motivated to participate during the session. Pt found on RA and SpO2 WNL t/o session. Pt was able to sit up to EOB with min A compared to mod-max assist that was required in previous sessions. Additionally, pt tolerated EOB sitting and standing transfer, and static standing better than in previous sessions. In standing, pt was unable to do any marching or take any steps secondary to L knee buckling during weight shifting but was able to maintain a standing position with CGA for around 3 minutes.  While pt demonstrated signs of improvement, pt was still unable to ambulate and would be unsafe to return to prior living situation. Pt will benefit from PT services in a SNF setting upon discharge to safely address deficits listed in patient problem list for decreased caregiver assistance and eventual return to PLOF.    Follow Up Recommendations  SNF     Equipment Recommendations  Rolling walker with 5" wheels;3in1 (PT)    Recommendations for Other Services       Precautions / Restrictions Precautions Precautions: Fall Precaution Comments: Patient with static thumb splint on R hand Restrictions Weight Bearing Restrictions: Yes LLE Weight Bearing: Weight bearing as tolerated    Mobility  Bed Mobility Overal bed mobility: Needs Assistance Bed Mobility: Supine to Sit;Sit to Supine     Supine to sit: Min assist Sit to supine: Max assist   General bed mobility comments: Very minimal assist for LLE and min assist for trunk. When transferring back to supine, pt appeared much more fatigued and required max  A  Transfers Overall transfer level: Needs assistance Equipment used: Rolling walker (2 wheeled) Transfers: Sit to/from Stand Sit to Stand: Min assist         General transfer comment: good carryover with hand positioning, good effort, min assist and cueing to achieve upright standing posture and to WB through LLE  Ambulation/Gait             General Gait Details: Unsafe to attempt secondary to patient being unable to complete standing marching without physical assistance   Stairs             Wheelchair Mobility    Modified Rankin (Stroke Patients Only)       Balance Overall balance assessment: Needs assistance Sitting-balance support: Single extremity supported;Feet unsupported Sitting balance-Leahy Scale: Fair Sitting balance - Comments: Pt was able to sit EOB with UE support for short durations (<15 seconds) before needing single or bil UE support Postural control: Right lateral lean Standing balance support: Bilateral upper extremity supported;During functional activity Standing balance-Leahy Scale: Fair Standing balance comment: Heavy BUE assist through RW, especially when in LLE stance, occasional LOB from L knee buckling                            Cognition Arousal/Alertness: Awake/alert Behavior During Therapy: WFL for tasks assessed/performed Overall Cognitive Status: Within Functional Limits for tasks assessed  Exercises Total Joint Exercises Ankle Circles/Pumps: AROM;Strengthening;Both;10 reps Quad Sets: Strengthening;Both;10 reps Gluteal Sets: Strengthening;Both;10 reps Heel Slides: AROM;Strengthening;Both;10 reps Hip ABduction/ADduction: AAROM;Both;10 reps Straight Leg Raises: AAROM;Both;10 reps Long Arc Quad: AROM;Strengthening;Both;10 reps Other Exercises Other Exercises: Sitting balance training Other Exercises: Standing endurance training    General Comments         Pertinent Vitals/Pain Pain Assessment: No/denies pain(denies pain at rest, pain with movement) Pain Location: L hip Pain Descriptors / Indicators: Aching;Sore;Moaning;Spasm;Grimacing Pain Intervention(s): Limited activity within patient's tolerance;Monitored during session    Home Living                      Prior Function            PT Goals (current goals can now be found in the care plan section) Progress towards PT goals: Progressing toward goals    Frequency    BID      PT Plan Current plan remains appropriate    Co-evaluation              AM-PAC PT "6 Clicks" Mobility   Outcome Measure  Help needed turning from your back to your side while in a flat bed without using bedrails?: A Little Help needed moving from lying on your back to sitting on the side of a flat bed without using bedrails?: A Little Help needed moving to and from a bed to a chair (including a wheelchair)?: A Lot Help needed standing up from a chair using your arms (e.g., wheelchair or bedside chair)?: A Little Help needed to walk in hospital room?: Total Help needed climbing 3-5 steps with a railing? : Total 6 Click Score: 13    End of Session Equipment Utilized During Treatment: Gait belt Activity Tolerance: Patient tolerated treatment well Patient left: in bed;with call bell/phone within reach;with family/visitor present;with SCD's reapplied Nurse Communication: Mobility status;Precautions;Weight bearing status PT Visit Diagnosis: Unsteadiness on feet (R26.81);Other abnormalities of gait and mobility (R26.89);Muscle weakness (generalized) (M62.81);History of falling (Z91.81);Pain Pain - Right/Left: Left Pain - part of body: Hip     Time: [id number] PT Time Calculation (min) (ACUTE ONLY): 40 min  Charges:                        Annabelle Harman, SPT 04/14/19 12:40 PM

## 2019-04-18 DIAGNOSIS — M8000XD Age-related osteoporosis with current pathological fracture, unspecified site, subsequent encounter for fracture with routine healing: Secondary | ICD-10-CM | POA: Insufficient documentation

## 2019-05-06 ENCOUNTER — Other Ambulatory Visit
Admission: RE | Admit: 2019-05-06 | Discharge: 2019-05-06 | Disposition: A | Discharge status: Home / Self Care | Payer: Medicare Other | Source: Ambulatory Visit | Attending: Internal Medicine | Admitting: Internal Medicine

## 2019-05-06 DIAGNOSIS — R82998 Other abnormal findings in urine: Secondary | ICD-10-CM | POA: Insufficient documentation

## 2019-05-06 DIAGNOSIS — R350 Frequency of micturition: Secondary | ICD-10-CM | POA: Diagnosis present

## 2019-05-06 DIAGNOSIS — R32 Unspecified urinary incontinence: Secondary | ICD-10-CM | POA: Diagnosis not present

## 2019-05-06 LAB — URINALYSIS, ROUTINE W REFLEX MICROSCOPIC
Bilirubin Urine: NEGATIVE
Glucose, UA: NEGATIVE mg/dL
Hgb urine dipstick: NEGATIVE
Ketones, ur: NEGATIVE mg/dL
Leukocytes,Ua: NEGATIVE
Nitrite: NEGATIVE
Protein, ur: NEGATIVE mg/dL
Specific Gravity, Urine: 1.013 (ref 1.005–1.030)
pH: 6 (ref 5.0–8.0)

## 2019-05-08 LAB — URINE CULTURE: Culture: <10000 — AB

## 2019-05-18 ENCOUNTER — Encounter
Admission: RE | Admit: 2019-05-18 | Discharge: 2019-05-18 | Disposition: A | Discharge status: Home / Self Care | Payer: Medicare Other | Source: Ambulatory Visit | Attending: Internal Medicine | Admitting: Internal Medicine

## 2019-08-24 DIAGNOSIS — I351 Nonrheumatic aortic (valve) insufficiency: Secondary | ICD-10-CM | POA: Insufficient documentation

## 2019-08-24 DIAGNOSIS — I350 Nonrheumatic aortic (valve) stenosis: Secondary | ICD-10-CM | POA: Insufficient documentation

## 2019-10-04 DIAGNOSIS — H748X2 Other specified disorders of left middle ear and mastoid: Secondary | ICD-10-CM | POA: Insufficient documentation

## 2019-10-04 DIAGNOSIS — H903 Sensorineural hearing loss, bilateral: Secondary | ICD-10-CM | POA: Insufficient documentation

## 2020-03-19 DIAGNOSIS — Z974 Presence of external hearing-aid: Secondary | ICD-10-CM | POA: Insufficient documentation

## 2020-04-05 IMAGING — DX LEFT ANKLE COMPLETE - 3+ VIEW
3 series · 3 of 3 positions shown · non-contrast
Comparison: None.

CLINICAL DATA: Then and swelling

EXAM:
LEFT ANKLE COMPLETE - 3+ VIEW

[ankle ap]
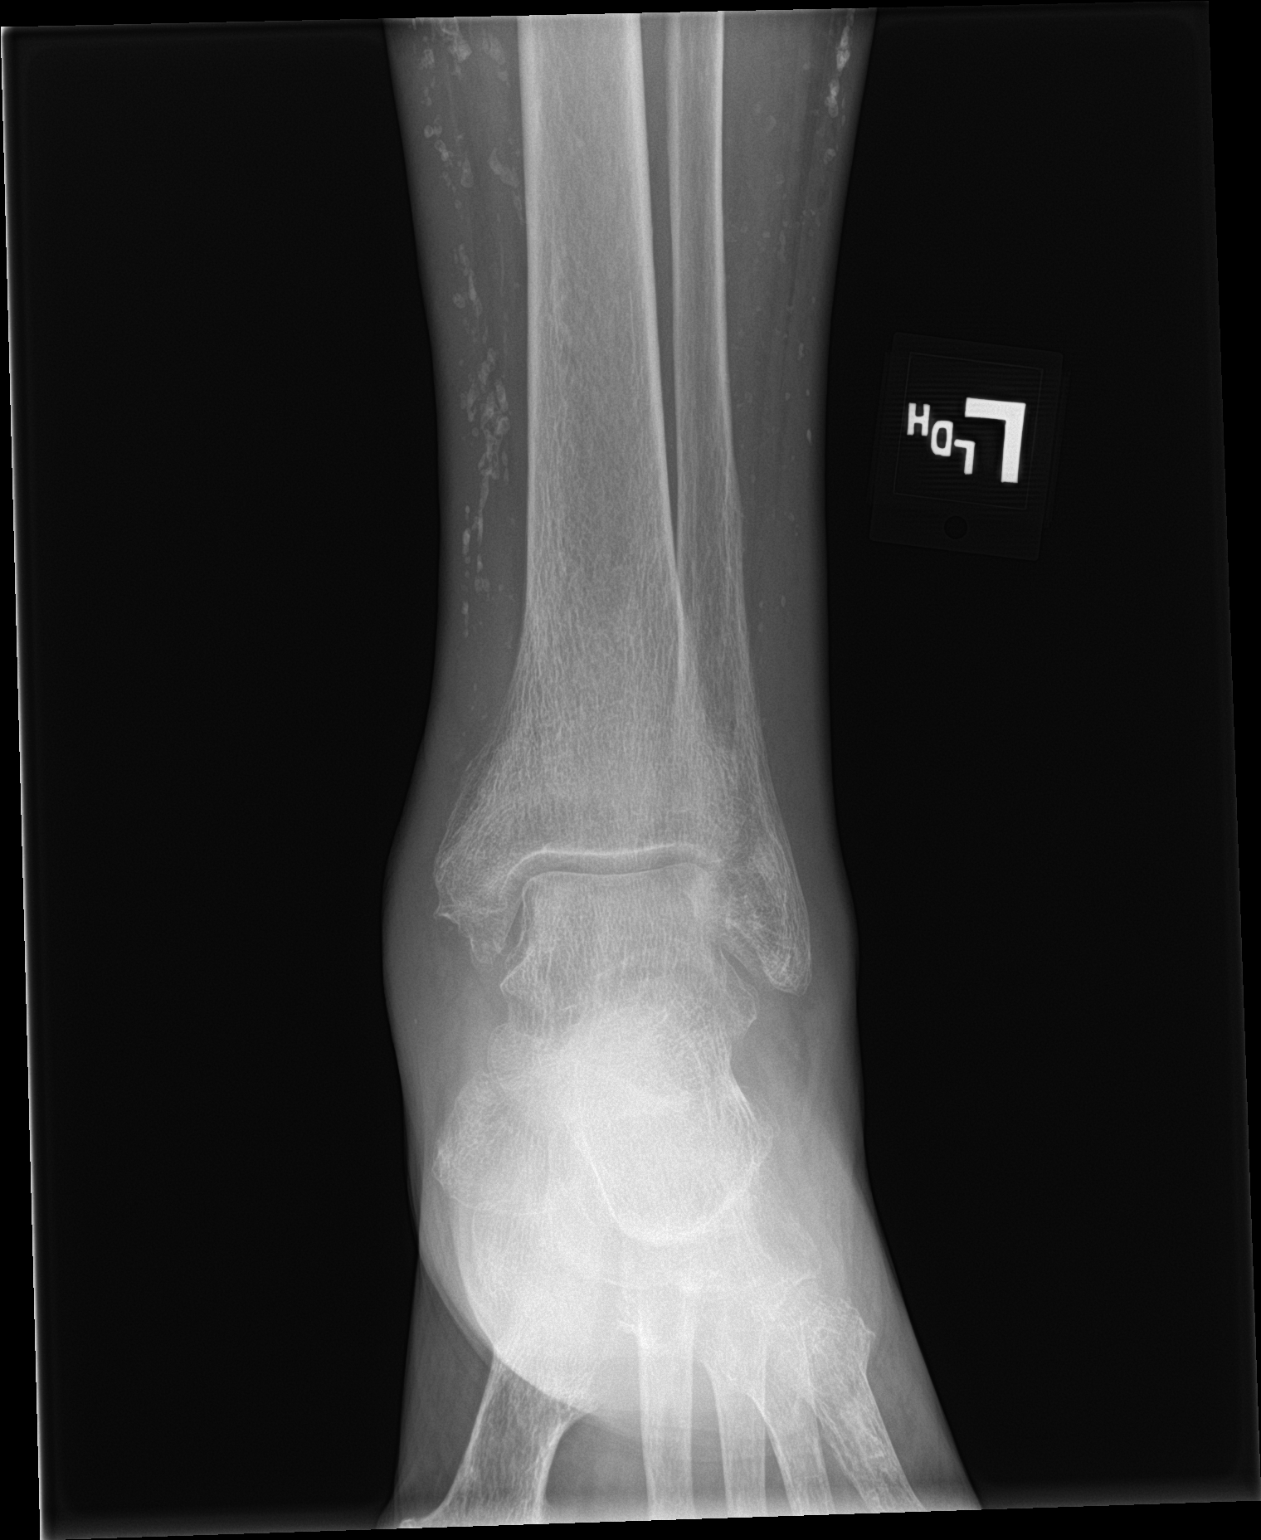

[ankle obl]
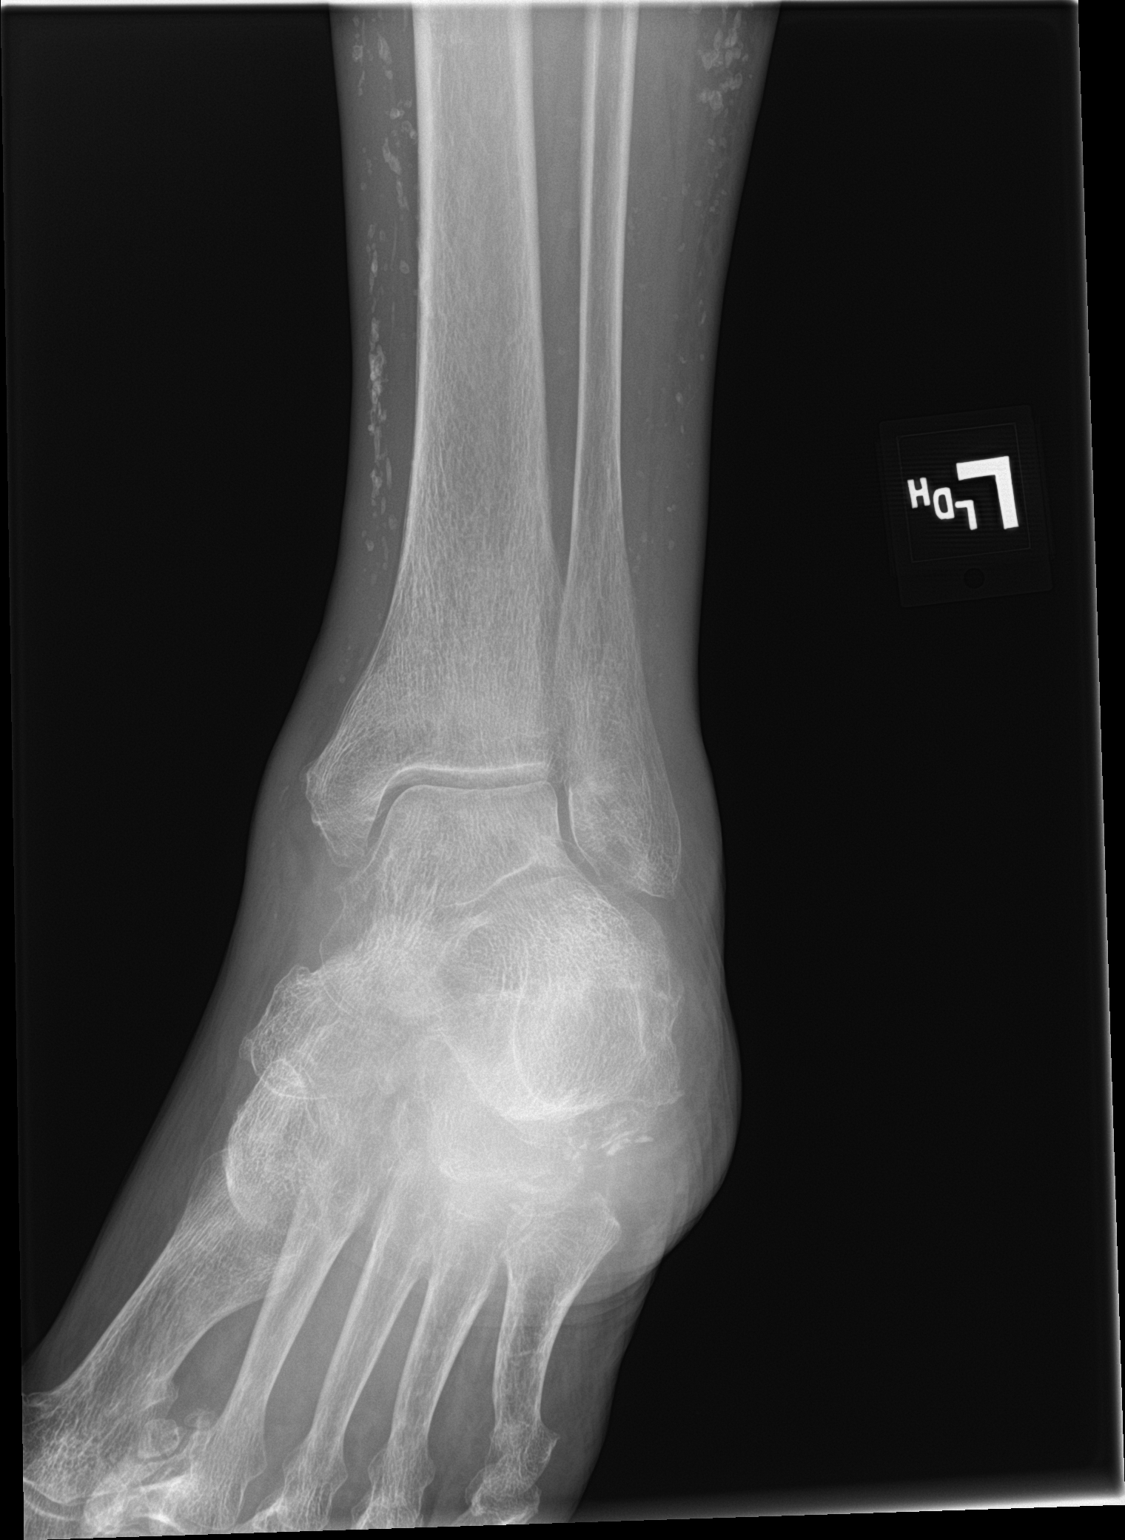

[ankle lat]
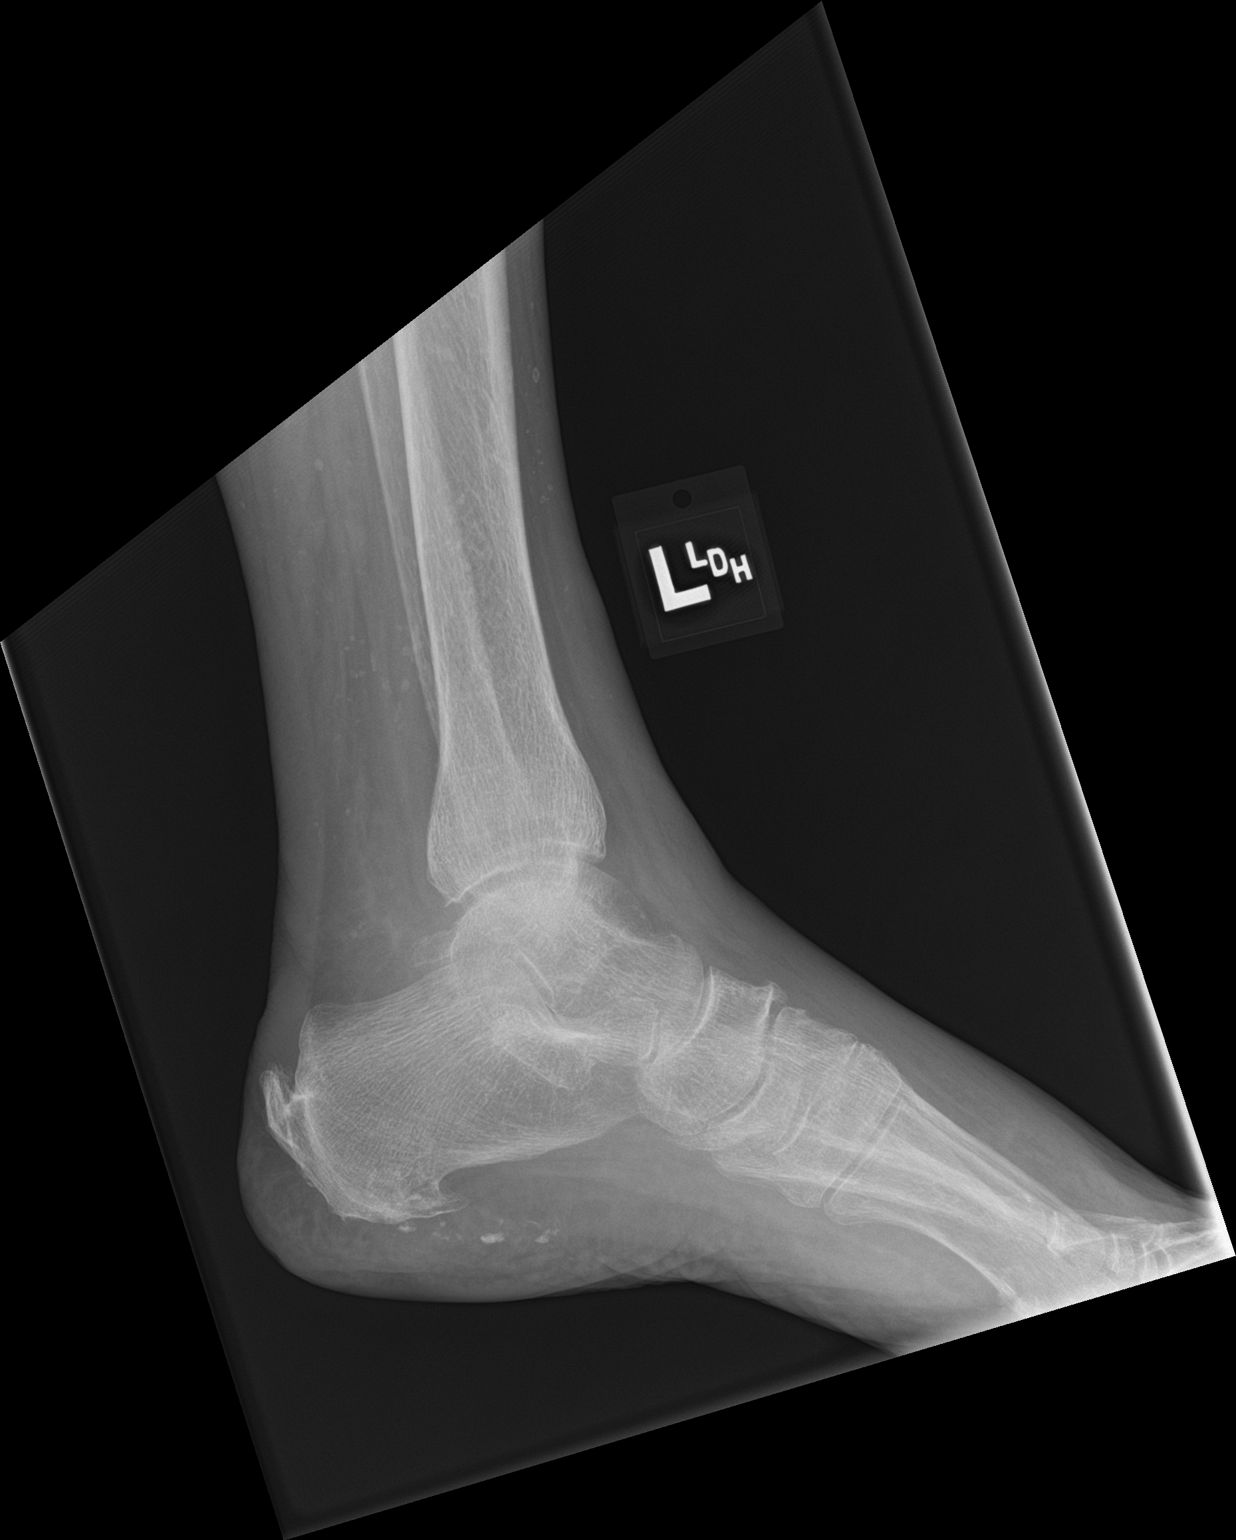

[3 of 3 positions shown; findings below may reference images not displayed]

FINDINGS: Frontal, oblique, and lateral views were obtained. Bones are
diffusely osteoporotic. There is mild generalized soft tissue
swelling. There is no evident fracture or joint effusion. There is
no appreciable joint space narrowing or erosion. There are spurs
arising from the posterior and inferior calcaneus. The ankle mortise
appears intact. There is plantar fascial calcification.
IMPRESSION: Bones osteoporotic. Soft tissue swelling. No evident fracture. No
appreciable joint space narrowing. Prominent calcaneal spurs noted.
Ankle mortise appears intact.

## 2020-11-13 ENCOUNTER — Encounter (INDEPENDENT_AMBULATORY_CARE_PROVIDER_SITE_OTHER): Payer: Self-pay | Admitting: Vascular Surgery

## 2020-11-13 ENCOUNTER — Other Ambulatory Visit: Payer: Self-pay

## 2020-11-13 ENCOUNTER — Ambulatory Visit (INDEPENDENT_AMBULATORY_CARE_PROVIDER_SITE_OTHER): Payer: Medicare Other | Admitting: Vascular Surgery

## 2020-11-13 VITALS — BP 149/66 | HR 76 | Ht 64.0 in | Wt 144.0 lb

## 2020-11-13 DIAGNOSIS — E119 Type 2 diabetes mellitus without complications: Secondary | ICD-10-CM | POA: Diagnosis not present

## 2020-11-13 DIAGNOSIS — I10 Essential (primary) hypertension: Secondary | ICD-10-CM

## 2020-11-13 DIAGNOSIS — L97311 Non-pressure chronic ulcer of right ankle limited to breakdown of skin: Secondary | ICD-10-CM | POA: Diagnosis not present

## 2020-11-13 DIAGNOSIS — M79609 Pain in unspecified limb: Secondary | ICD-10-CM | POA: Insufficient documentation

## 2020-11-13 DIAGNOSIS — M7989 Other specified soft tissue disorders: Secondary | ICD-10-CM

## 2020-11-13 DIAGNOSIS — M79661 Pain in right lower leg: Secondary | ICD-10-CM

## 2020-11-13 NOTE — Progress Notes (Signed)
Patient ID: Megan Salinas, female   DOB: 1926-09-03, 87 y.o.   MRN: [id number]  Chief Complaint  Patient presents with   Follow-up    Pt comi g in for FU pain from previous visits . New pain in ankle and Rt foot going on for 3 weeks     HPI Megan Salinas is a 87 y.o. female.  Patient presents for evaluation of pain and a small lateral ulceration of the right ankle laterally.  This started about 3 to 4 weeks ago.  No trauma or injury.  She does have prominent varicosities on that side but no known history of DVT or superficial thrombophlebitis.  It is very painful.  It is tender to the touch.  The wound has been stable but not improving over the last couple of weeks.  No fevers or chills or signs of systemic infection.  No current left leg symptoms.     Past Medical History:  Diagnosis Date   Back pain    CAD (coronary artery disease)    CHF (congestive heart failure) (HCC)    GERD (gastroesophageal reflux disease)    High cholesterol    Hypertension    TIA (transient ischemic attack) 01/2016   "possible", no deficits   Wears hearing aid    bilateral    Past Surgical History:  Procedure Laterality Date   ABDOMINAL HYSTERECTOMY  1998   APPENDECTOMY     baldder tac  1995   BREAST CYST ASPIRATION Right    CARPAL TUNNEL RELEASE Right 03/26/2016   Procedure: CARPAL TUNNEL RELEASE ENDOSCOPIC  right;  Surgeon: Corky Mull, MD;  Location: [place];  Service: Orthopedics;  Laterality: Right;  bier block   CHOLECYSTECTOMY     CORONARY ARTERY BYPASS GRAFT  02/15/2010   [facility], 3 vessel   INTRAMEDULLARY (IM) NAIL INTERTROCHANTERIC Left 04/11/2019   Procedure: INTRAMEDULLARY (IM) NAIL INTERTROCHANTRIC;  Surgeon: Leim Fabry, MD;  Location: [facility];  Service: Orthopedics;  Laterality: Left;   TONSILLECTOMY       Family History  Problem Relation Age of Onset   Breast cancer Paternal Aunt        5's   Hypertension Mother   No bleeding or clotting  disorders   Social History   Tobacco Use   Smoking status: Former   Smokeless tobacco: Never   Tobacco comments:    quit over 50 yrs ago  Substance Use Topics   Alcohol use: Yes    Alcohol/week: 5.0 standard drinks    Types: 5 Glasses of wine per week    Comment:     Drug use: No     Allergies  Allergen Reactions   Dust Mite Extract Other (See Comments)    Runny nose   Sulfa Antibiotics Itching and Rash    Red, itchy palms    Current Outpatient Medications  Medication Sig Dispense Refill   acetaminophen (TYLENOL) 500 MG tablet Take 500 mg by mouth every 6 (six) hours as needed for mild pain.      amitriptyline (ELAVIL) 25 MG tablet Take 25 mg by mouth at bedtime.      aspirin EC 81 MG tablet Take 81 mg by mouth daily.      betamethasone dipropionate (DIPROLENE) 0.05 % ointment APPLY TO AFFECTED AREAS ON VULVA TWO TIMES WEEKLY     cetirizine (ZYRTEC) 10 MG tablet Take 10 mg by mouth daily.     docusate sodium (COLACE) 100 MG capsule Take 1  capsule (100 mg total) by mouth 2 (two) times daily. 10 capsule 0   Ensure Max Protein (ENSURE MAX PROTEIN) LIQD Take 330 mLs (11 oz total) by mouth 2 (two) times daily between meals. 330 mL 0   famotidine (PEPCID) 20 MG tablet Take 20 mg by mouth 2 (two) times daily.      HYDROcodone-acetaminophen (NORCO/VICODIN) 5-325 MG tablet Take 1 tablet by mouth every 4 (four) hours as needed for moderate pain. 20 tablet 0   ibuprofen (ADVIL,MOTRIN) 200 MG tablet Take 200 mg by mouth every 6 (six) hours as needed for moderate pain.      lisinopril (PRINIVIL,ZESTRIL) 20 MG tablet Take 20 mg by mouth daily.   11   magnesium hydroxide (MILK OF MAGNESIA) 400 MG/5ML suspension Take 30 mLs by mouth daily as needed for mild constipation. 355 mL 0   methocarbamol (ROBAXIN) 500 MG tablet Take 1 tablet (500 mg total) by mouth every 8 (eight) hours as needed for muscle spasms. 20 tablet 0   metoprolol tartrate (LOPRESSOR) 25 MG tablet TAKE 1/2 TABLET BY MOUTH  TWICE DAILY     Multiple Vitamin (MULTI-VITAMINS) TABS Take 1 tablet by mouth daily.      nystatin cream (MYCOSTATIN) Apply one application to affected area as needed.     oxyCODONE (OXY IR/ROXICODONE) 5 MG immediate release tablet Take 0.5-1 tablets (2.5-5 mg total) by mouth every 4 (four) hours as needed for moderate pain or severe pain (pain score 4-6). 30 tablet 0   pantoprazole (PROTONIX) 40 MG tablet Take 1 tablet (40 mg total) by mouth daily. 60 tablet 0   polyethylene glycol (MIRALAX / GLYCOLAX) packet Take 17 g by mouth daily.      senna-docusate (SENOKOT-S) 8.6-50 MG tablet Take 1 tablet by mouth at bedtime as needed for mild constipation. 15 tablet 0   simvastatin (ZOCOR) 40 MG tablet TAKE 1 TABLET BY MOUTH EVERY DAY     spironolactone (ALDACTONE) 25 MG tablet TAKE 1/2 TABLET BY MOUTH EVERY DAY     enoxaparin (LOVENOX) 40 MG/0.4ML injection Inject 0.4 mLs (40 mg total) into the skin daily for 14 days. 5.6 mL 0   No current facility-administered medications for this visit.      REVIEW OF SYSTEMS (Negative unless checked)  Constitutional: [] Weight loss  [] Fever  [] Chills Cardiac: [] Chest pain   [] Chest pressure   [] Palpitations   [] Shortness of breath when laying flat   [] Shortness of breath at rest   [] Shortness of breath with exertion. Vascular:  [] Pain in legs with walking   [] Pain in legs at rest   [] Pain in legs when laying flat   [] Claudication   [] Pain in feet when walking  [] Pain in feet at rest  [] Pain in feet when laying flat   [] History of DVT   [] Phlebitis   [] Swelling in legs   [x] Varicose veins   [] Non-healing ulcers Pulmonary:   [] Uses home oxygen   [] Productive cough   [] Hemoptysis   [] Wheeze  [] COPD   [] Asthma Neurologic:  [] Dizziness  [] Blackouts   [] Seizures   [] History of stroke   [] History of TIA  [] Aphasia   [] Temporary blindness   [] Dysphagia   [] Weakness or numbness in arms   [] Weakness or numbness in legs Musculoskeletal:  [x] Arthritis   [] Joint swelling    [x] Joint pain   [x] Low back pain Hematologic:  [] Easy bruising  [] Easy bleeding   [] Hypercoagulable state   [] Anemic  [] Hepatitis Gastrointestinal:  [] Blood in stool   [] Vomiting blood  [  x]Gastroesophageal reflux/heartburn   [] Abdominal pain Genitourinary:  [] Chronic kidney disease   [] Difficult urination  [] Frequent urination  [] Burning with urination   [] Hematuria Skin:  [] Rashes   [] Ulcers   [] Wounds Psychological:  [] History of anxiety   []  History of major depression.    Physical Exam BP (!) 149/66   Pulse 76   Ht 5\' 4"  (1.626 m)   Wt 144 lb (65.3 kg)   BMI 24.72 kg/m  Gen:  WD/WN, NAD.  Appears younger than stated age Head: [place]/AT, No temporalis wasting.  Ear/Nose/Throat: Hearing diminished, nares w/o erythema or drainage, oropharynx w/o Erythema/Exudate Eyes: Conjunctiva clear, sclera non-icteric  Neck: trachea midline.  No JVD.  Pulmonary:  Good air movement, respirations not labored, no use of accessory muscles  Cardiac: Irregular Vascular:  Vessel Right Left  Radial Palpable Palpable                          DP 1+ 1+  PT 1+ 1+   Gastrointestinal:. No masses, surgical incisions, or scars. Musculoskeletal: M/S 5/5 throughout.  Extremities without ischemic changes.  No deformity or atrophy.  Extensive varicosities are present in the right lower extremity some measuring 3 to 4 mm in diameter.  Mild to moderate stasis dermatitis.  1-2+ right lower extremity edema with trace to 1+ left lower extremity edema. Neurologic: Sensation grossly intact in extremities.  Symmetrical.  Speech is fluent. Motor exam as listed above. Psychiatric: Judgment intact, Mood & affect appropriate for pt's clinical situation. Dermatologic: No rashes or ulcers noted.  No cellulitis or open wounds.    Radiology No results found.  Labs No results found for this or any previous visit (from the past 2160 hour(s)).  Assessment/Plan: Benign essential hypertension blood pressure control  important in reducing the progression of atherosclerotic disease. On appropriate oral medications.     Swelling of limb Improved with conservative management initially, but now worse.  Likely from venous insufficiency but may also have a component of lymphedema from chronic scarring of the lymph channels.   History of type 2 diabetes mellitus blood glucose control important in reducing the progression of atherosclerotic disease. Also, involved in wound healing. On appropriate medications.  Lower limb ulcer, ankle, right, limited to breakdown of skin ([place]) The patient has a small ulceration on the right lateral ankle.  We will going to go ahead and place her in a 3 layer Unna boot today.  This was placed on the right leg.  This will be changed weekly.  We will get noninvasive arterial and venous studies in the near future at a follow-up visit.  Pain in limb The patient has new pain in her right ankle and lower leg.  Although I think this is largely related to the small ulcer in the location of the ulcer, we will be working that up with arterial and venous studies in the near future.  Circulation could certainly be contributing to her pain as well.      Leotis Pain 11/13/2020, 12:21 PM   This note was created with Dragon medical transcription system.  Any errors from dictation are unintentional.

## 2020-11-13 NOTE — Assessment & Plan Note (Signed)
The patient has new pain in her right ankle and lower leg.  Although I think this is largely related to the small ulcer in the location of the ulcer, we will be working that up with arterial and venous studies in the near future.  Circulation could certainly be contributing to her pain as well.

## 2020-11-13 NOTE — Assessment & Plan Note (Signed)
The patient has a small ulceration on the right lateral ankle.  We will going to go ahead and place her in a 3 layer Unna boot today.  This was placed on the right leg.  This will be changed weekly.  We will get noninvasive arterial and venous studies in the near future at a follow-up visit.

## 2020-11-20 ENCOUNTER — Other Ambulatory Visit: Payer: Self-pay

## 2020-11-20 ENCOUNTER — Ambulatory Visit (INDEPENDENT_AMBULATORY_CARE_PROVIDER_SITE_OTHER): Payer: Medicare Other | Admitting: Nurse Practitioner

## 2020-11-20 VITALS — BP 147/71 | HR 74 | Ht 64.0 in | Wt 145.0 lb

## 2020-11-20 DIAGNOSIS — L97311 Non-pressure chronic ulcer of right ankle limited to breakdown of skin: Secondary | ICD-10-CM | POA: Diagnosis not present

## 2020-11-20 NOTE — Progress Notes (Signed)
History of Present Illness  There is no documented history at this time  Assessments & Plan   There are no diagnoses linked to this encounter.    Additional instructions  Subjective:  Patient presents with venous ulcer of the Right lower extremity.    Procedure:  3 layer unna wrap was placed Right lower extremity.   Plan:   Follow up in one week.   

## 2020-11-25 ENCOUNTER — Encounter (INDEPENDENT_AMBULATORY_CARE_PROVIDER_SITE_OTHER): Payer: Self-pay | Admitting: Nurse Practitioner

## 2020-11-27 ENCOUNTER — Ambulatory Visit (INDEPENDENT_AMBULATORY_CARE_PROVIDER_SITE_OTHER): Payer: Medicare Other

## 2020-11-27 ENCOUNTER — Other Ambulatory Visit: Payer: Self-pay

## 2020-11-27 ENCOUNTER — Ambulatory Visit (INDEPENDENT_AMBULATORY_CARE_PROVIDER_SITE_OTHER): Payer: Medicare Other | Admitting: Nurse Practitioner

## 2020-11-27 DIAGNOSIS — L97311 Non-pressure chronic ulcer of right ankle limited to breakdown of skin: Secondary | ICD-10-CM

## 2020-11-27 DIAGNOSIS — M79661 Pain in right lower leg: Secondary | ICD-10-CM

## 2020-11-28 ENCOUNTER — Encounter (HOSPITAL_COMMUNITY): Payer: Self-pay

## 2020-11-28 ENCOUNTER — Emergency Department: Payer: Medicare Other

## 2020-11-28 ENCOUNTER — Other Ambulatory Visit: Payer: Self-pay

## 2020-11-28 ENCOUNTER — Emergency Department
Admission: EM | Admit: 2020-11-28 | Discharge: 2020-11-28 | Disposition: A | Discharge status: Other Acute Inpatient Hospital | Payer: Medicare Other | Attending: Emergency Medicine | Admitting: Emergency Medicine

## 2020-11-28 ENCOUNTER — Inpatient Hospital Stay (HOSPITAL_COMMUNITY)
Admission: EM | Admit: 2020-11-28 | Discharge: 2020-12-03 | DRG: 522 | Disposition: A | Discharge status: Skilled Nursing Facility | Payer: Medicare Other | Attending: Internal Medicine | Admitting: Internal Medicine

## 2020-11-28 DIAGNOSIS — W19XXXA Unspecified fall, initial encounter: Secondary | ICD-10-CM

## 2020-11-28 DIAGNOSIS — I5022 Chronic systolic (congestive) heart failure: Secondary | ICD-10-CM | POA: Diagnosis not present

## 2020-11-28 DIAGNOSIS — Z7982 Long term (current) use of aspirin: Secondary | ICD-10-CM

## 2020-11-28 DIAGNOSIS — I5032 Chronic diastolic (congestive) heart failure: Secondary | ICD-10-CM | POA: Diagnosis present

## 2020-11-28 DIAGNOSIS — M898X9 Other specified disorders of bone, unspecified site: Secondary | ICD-10-CM | POA: Diagnosis present

## 2020-11-28 DIAGNOSIS — Z87891 Personal history of nicotine dependence: Secondary | ICD-10-CM | POA: Insufficient documentation

## 2020-11-28 DIAGNOSIS — Z8673 Personal history of transient ischemic attack (TIA), and cerebral infarction without residual deficits: Secondary | ICD-10-CM

## 2020-11-28 DIAGNOSIS — I2510 Atherosclerotic heart disease of native coronary artery without angina pectoris: Secondary | ICD-10-CM | POA: Insufficient documentation

## 2020-11-28 DIAGNOSIS — Z8249 Family history of ischemic heart disease and other diseases of the circulatory system: Secondary | ICD-10-CM | POA: Diagnosis not present

## 2020-11-28 DIAGNOSIS — Y92009 Unspecified place in unspecified non-institutional (private) residence as the place of occurrence of the external cause: Secondary | ICD-10-CM

## 2020-11-28 DIAGNOSIS — E042 Nontoxic multinodular goiter: Secondary | ICD-10-CM | POA: Diagnosis present

## 2020-11-28 DIAGNOSIS — E559 Vitamin D deficiency, unspecified: Secondary | ICD-10-CM | POA: Diagnosis present

## 2020-11-28 DIAGNOSIS — L97319 Non-pressure chronic ulcer of right ankle with unspecified severity: Secondary | ICD-10-CM | POA: Diagnosis present

## 2020-11-28 DIAGNOSIS — G459 Transient cerebral ischemic attack, unspecified: Secondary | ICD-10-CM | POA: Diagnosis not present

## 2020-11-28 DIAGNOSIS — E785 Hyperlipidemia, unspecified: Secondary | ICD-10-CM | POA: Insufficient documentation

## 2020-11-28 DIAGNOSIS — S72001A Fracture of unspecified part of neck of right femur, initial encounter for closed fracture: Secondary | ICD-10-CM

## 2020-11-28 DIAGNOSIS — Z951 Presence of aortocoronary bypass graft: Secondary | ICD-10-CM | POA: Diagnosis not present

## 2020-11-28 DIAGNOSIS — W010XXA Fall on same level from slipping, tripping and stumbling without subsequent striking against object, initial encounter: Secondary | ICD-10-CM | POA: Diagnosis present

## 2020-11-28 DIAGNOSIS — D62 Acute posthemorrhagic anemia: Secondary | ICD-10-CM | POA: Diagnosis not present

## 2020-11-28 DIAGNOSIS — Z79899 Other long term (current) drug therapy: Secondary | ICD-10-CM | POA: Insufficient documentation

## 2020-11-28 DIAGNOSIS — D497 Neoplasm of unspecified behavior of endocrine glands and other parts of nervous system: Secondary | ICD-10-CM

## 2020-11-28 DIAGNOSIS — E119 Type 2 diabetes mellitus without complications: Secondary | ICD-10-CM

## 2020-11-28 DIAGNOSIS — I110 Hypertensive heart disease with heart failure: Secondary | ICD-10-CM | POA: Diagnosis present

## 2020-11-28 DIAGNOSIS — E7800 Pure hypercholesterolemia, unspecified: Secondary | ICD-10-CM | POA: Diagnosis present

## 2020-11-28 DIAGNOSIS — E1169 Type 2 diabetes mellitus with other specified complication: Secondary | ICD-10-CM | POA: Diagnosis not present

## 2020-11-28 DIAGNOSIS — Z882 Allergy status to sulfonamides status: Secondary | ICD-10-CM | POA: Diagnosis not present

## 2020-11-28 DIAGNOSIS — Z20822 Contact with and (suspected) exposure to covid-19: Secondary | ICD-10-CM | POA: Diagnosis present

## 2020-11-28 DIAGNOSIS — K219 Gastro-esophageal reflux disease without esophagitis: Secondary | ICD-10-CM | POA: Diagnosis present

## 2020-11-28 DIAGNOSIS — W1839XA Other fall on same level, initial encounter: Secondary | ICD-10-CM | POA: Diagnosis not present

## 2020-11-28 DIAGNOSIS — I10 Essential (primary) hypertension: Secondary | ICD-10-CM | POA: Diagnosis not present

## 2020-11-28 DIAGNOSIS — S79911A Unspecified injury of right hip, initial encounter: Secondary | ICD-10-CM | POA: Diagnosis present

## 2020-11-28 DIAGNOSIS — Z974 Presence of external hearing-aid: Secondary | ICD-10-CM

## 2020-11-28 DIAGNOSIS — T148XXA Other injury of unspecified body region, initial encounter: Secondary | ICD-10-CM

## 2020-11-28 DIAGNOSIS — L97311 Non-pressure chronic ulcer of right ankle limited to breakdown of skin: Secondary | ICD-10-CM | POA: Diagnosis not present

## 2020-11-28 DIAGNOSIS — S72011A Unspecified intracapsular fracture of right femur, initial encounter for closed fracture: Secondary | ICD-10-CM | POA: Diagnosis present

## 2020-11-28 DIAGNOSIS — Z419 Encounter for procedure for purposes other than remedying health state, unspecified: Secondary | ICD-10-CM

## 2020-11-28 LAB — CBC WITH DIFFERENTIAL/PLATELET
Abs Immature Granulocytes: 0.14 10*3/uL — ABNORMAL HIGH (ref 0.00–0.07)
Basophils Absolute: 0 10*3/uL (ref 0.0–0.1)
Basophils Relative: 0 %
Eosinophils Absolute: 0.1 10*3/uL (ref 0.0–0.5)
Eosinophils Relative: 1 %
HCT: 34.2 % — ABNORMAL LOW (ref 36.0–46.0)
Hemoglobin: 11.4 g/dL — ABNORMAL LOW (ref 12.0–15.0)
Immature Granulocytes: 2 %
Lymphocytes Relative: 13 %
Lymphs Abs: 1.2 10*3/uL (ref 0.7–4.0)
MCH: 32.9 pg (ref 26.0–34.0)
MCHC: 33.3 g/dL (ref 30.0–36.0)
MCV: 98.6 fL (ref 80.0–100.0)
Monocytes Absolute: 0.7 10*3/uL (ref 0.1–1.0)
Monocytes Relative: 8 %
Neutro Abs: 7.2 10*3/uL (ref 1.7–7.7)
Neutrophils Relative %: 76 %
Platelets: 213 10*3/uL (ref 150–400)
RBC: 3.47 MIL/uL — ABNORMAL LOW (ref 3.87–5.11)
RDW: 14.7 % (ref 11.5–15.5)
WBC: 9.3 10*3/uL (ref 4.0–10.5)
nRBC: 0 % (ref 0.0–0.2)

## 2020-11-28 LAB — COMPREHENSIVE METABOLIC PANEL
ALT: 26 U/L (ref 0–44)
AST: 31 U/L (ref 15–41)
Albumin: 3.9 g/dL (ref 3.5–5.0)
Alkaline Phosphatase: 80 U/L (ref 38–126)
Anion gap: 7 (ref 5–15)
BUN: 13 mg/dL (ref 8–23)
CO2: 23 mmol/L (ref 22–32)
Calcium: 9 mg/dL (ref 8.9–10.3)
Chloride: 106 mmol/L (ref 98–111)
Creatinine, Ser: 0.54 mg/dL (ref 0.44–1.00)
GFR, Estimated: >60 mL/min (ref >60)
Glucose, Bld: 137 mg/dL — ABNORMAL HIGH (ref 70–99)
Potassium: 3.9 mmol/L (ref 3.5–5.1)
Sodium: 136 mmol/L (ref 135–145)
Total Bilirubin: 1.6 mg/dL — ABNORMAL HIGH (ref 0.3–1.2)
Total Protein: 6.9 g/dL (ref 6.5–8.1)

## 2020-11-28 LAB — RESP PANEL BY RT-PCR (FLU A&B, COVID) ARPGX2
Influenza A by PCR: NEGATIVE
Influenza B by PCR: NEGATIVE
SARS Coronavirus 2 by RT PCR: NEGATIVE

## 2020-11-28 MED ORDER — HYDROMORPHONE HCL 1 MG/ML IJ SOLN
0.5000 mg | Freq: Once | INTRAMUSCULAR | Status: AC
  Administered 2020-11-28: 0.5 mg via INTRAVENOUS
  Filled 2020-11-28: qty 1

## 2020-11-28 NOTE — ED Triage Notes (Signed)
Pt presents to the ED from [place] for right hip fracture onset tonight. Pt had trip and fall at [facility] where she lives. Pt has pulse motor sensation intact distally with use of doppler to the right foot. Pt denies pain at present, received Dilaudid PTA at [facility]. Pt waiting for surgical consult.

## 2020-11-28 NOTE — Consult Note (Signed)
Called by Dr. Starleen Blue in ER regarding this 87 year old female who is s/p fall today.  Patient having pain in right hip.  I have reviewed the radiographs taken in the ER which demonstrate a displaced right femoral neck hip fracture above a long femoral plate for fixation of a previous distal femur fracture.  The previous femur fracture appears well healed.  The presence of the long femoral plate increases the complexity of treating the patient's right hip fracture.  I feel the patient would benefit from orthopaedic trauma expertise. I therefore discussed this case with Dr. Altamese [place], the orthopaedic trauma specialist at [facility].  He is willing to perform the patient's definitive surgery and requested the patient be transferred to [place] for admission to the hospitalist service for pre-op clearance.  Patient will be NPO after midnight in preparation for surgery tomorrow.

## 2020-11-28 NOTE — ED Notes (Signed)
Reports  given to Perley Jain) at [facility]

## 2020-11-28 NOTE — ED Notes (Signed)
Reportsgiven to [facility]  EMS... ETA 17min

## 2020-11-28 NOTE — ED Notes (Signed)
At radiology at this time

## 2020-11-28 NOTE — ED Triage Notes (Signed)
Pt [facility] ems [facility] [place] [facility] lives, pt was waiting on her groceries and was hurrying to the door and fell, pt landed on the right hip, states that the security guard helped her onto her rolator and fell again, pt is c/o right hip pain, pt has shortening and rotation outward of the right leg, pt is wearing and una boot to the right lower leg and states that it is for venous reflux

## 2020-11-28 NOTE — ED Provider Notes (Signed)
[facility]  ____________________________________________   Event Date/Time   First MD Initiated Contact with Patient 11/28/20 1925     (approximate)  I have reviewed the triage vital signs and the nursing notes.   HISTORY  Chief Complaint Fall and Hip Pain    HPI Megan Salinas is a 87 y.o. female with past medical history of CAD, CHF GERD, hypertension, venous insufficiency who presents with right hip pain after a fall.  Patient was hurrying to her door to pick up her groceries when she lost her balance and fell landing on her right hip.  She was not able to get up on her own.  Security guard at her facility helped her up and she then had this another subsequent fall.  She denies hitting her head.  Does have some mild neck pain but denies numbness weakness or tingling.  Pain is located in the right hip.  Denies chest pain or abdominal pain.  Did not lose consciousness.         Past Medical History:  Diagnosis Date   Back pain    CAD (coronary artery disease)    CHF (congestive heart failure) (HCC)    GERD (gastroesophageal reflux disease)    High cholesterol    Hypertension    TIA (transient ischemic attack) 01/2016   "possible", no deficits   Wears hearing aid    bilateral    Patient Active Problem List   Diagnosis Date Noted   Lower limb ulcer, ankle, right, limited to breakdown of skin ([place]) 11/13/2020   Pain in limb 11/13/2020   Wears hearing aid in both ears 03/19/2020   Hemotympanum, left 10/04/2019   Sensorineural hearing loss of both ears 10/04/2019   Moderate aortic valve insufficiency 08/24/2019   Mild aortic stenosis 08/24/2019   Age-related osteoporosis with current pathological fracture with routine healing 04/18/2019   Displaced intertrochanteric fracture of left femur ([place]) 04/10/2019   Type 2 diabetes mellitus without complication ([place]) 72/09/4707   Preop examination 04/10/2019   Lymphedema 09/05/2018   Primary  osteoarthritis of right wrist 02/22/2018   Chest pain 02/28/2017   Venous insufficiency of right lower extremity 09/02/2016   Swelling of limb 08/15/2016   Frequent PVCs 03/12/2016   Transient global amnesia 02/26/2016   TIA (transient ischemic attack) 01/28/2016   Carpal tunnel syndrome, right 08/13/2015   DDD (degenerative disc disease), lumbar 09/12/2014   Lumbar radiculitis 09/12/2014   Lumbar stenosis with neurogenic claudication 09/12/2014   GERD (gastroesophageal reflux disease) 09/08/2014   History of type 2 diabetes mellitus 09/08/2014   Benign essential hypertension 06/16/2014   Moderate tricuspid insufficiency 06/07/2014   Abdominal pain, diffuse 62/83/6629   Chronic systolic CHF (congestive heart failure), NYHA class 3 ([place]) 12/05/2013   Moderate mitral insufficiency 12/05/2013   Coronary artery disease 03/29/2013   Mixed hyperlipidemia 03/29/2013    Past Surgical History:  Procedure Laterality Date   ABDOMINAL HYSTERECTOMY  1998   APPENDECTOMY     baldder tac  1995   BREAST CYST ASPIRATION Right    CARPAL TUNNEL RELEASE Right 03/26/2016   Procedure: CARPAL TUNNEL RELEASE ENDOSCOPIC  right;  Surgeon: Corky Mull, MD;  Location: [place];  Service: Orthopedics;  Laterality: Right;  bier block   CHOLECYSTECTOMY     CORONARY ARTERY BYPASS GRAFT  02/15/2010   [facility], 3 vessel   INTRAMEDULLARY (IM) NAIL INTERTROCHANTERIC Left 04/11/2019   Procedure: INTRAMEDULLARY (IM) NAIL INTERTROCHANTRIC;  Surgeon: Leim Fabry, MD;  Location:  [facility];  Service: Orthopedics;  Laterality: Left;   TONSILLECTOMY      Prior to Admission medications   Medication Sig Start Date End Date Taking? Authorizing Provider  acetaminophen (TYLENOL) 500 MG tablet Take 500 mg by mouth every 6 (six) hours as needed for mild pain.     [provider]  amitriptyline (ELAVIL) 25 MG tablet Take 25 mg by mouth at bedtime.  09/12/15   [provider]  aspirin EC 81 MG tablet Take  81 mg by mouth daily.     [provider]  betamethasone dipropionate (DIPROLENE) 0.05 % ointment APPLY TO AFFECTED AREAS ON VULVA TWO TIMES WEEKLY 03/30/19   [provider]  cetirizine (ZYRTEC) 10 MG tablet Take 10 mg by mouth daily. 03/29/19   [provider]  docusate sodium (COLACE) 100 MG capsule Take 1 capsule (100 mg total) by mouth 2 (two) times daily. 04/13/19   Dhungel, Nishant, MD  enoxaparin (LOVENOX) 40 MG/0.4ML injection Inject 0.4 mLs (40 mg total) into the skin daily for 14 days. 04/12/19 04/26/19  Reche Dixon, PA-C  Ensure Max Protein (ENSURE MAX PROTEIN) LIQD Take 330 mLs (11 oz total) by mouth 2 (two) times daily between meals. 04/13/19   Dhungel, Flonnie Overman, MD  famotidine (PEPCID) 20 MG tablet Take 20 mg by mouth 2 (two) times daily.     [provider]  HYDROcodone-acetaminophen (NORCO/VICODIN) 5-325 MG tablet Take 1 tablet by mouth every 4 (four) hours as needed for moderate pain. 04/13/19   Dhungel, Flonnie Overman, MD  ibuprofen (ADVIL,MOTRIN) 200 MG tablet Take 200 mg by mouth every 6 (six) hours as needed for moderate pain.     [provider]  lisinopril (PRINIVIL,ZESTRIL) 20 MG tablet Take 20 mg by mouth daily.  05/19/16   [provider]  magnesium hydroxide (MILK OF MAGNESIA) 400 MG/5ML suspension Take 30 mLs by mouth daily as needed for mild constipation. 04/13/19   Dhungel, Flonnie Overman, MD  methocarbamol (ROBAXIN) 500 MG tablet Take 1 tablet (500 mg total) by mouth every 8 (eight) hours as needed for muscle spasms. 04/13/19   Dhungel, Flonnie Overman, MD  metoprolol tartrate (LOPRESSOR) 25 MG tablet TAKE 1/2 TABLET BY MOUTH TWICE DAILY 06/26/15   [provider]  Multiple Vitamin (MULTI-VITAMINS) TABS Take 1 tablet by mouth daily.     [provider]  nystatin cream (MYCOSTATIN) Apply one application to affected area as needed. 09/10/15   [provider]  oxyCODONE (OXY IR/ROXICODONE) 5 MG immediate release tablet Take  0.5-1 tablets (2.5-5 mg total) by mouth every 4 (four) hours as needed for moderate pain or severe pain (pain score 4-6). 04/12/19   Reche Dixon, PA-C  pantoprazole (PROTONIX) 40 MG tablet Take 1 tablet (40 mg total) by mouth daily. 04/13/19   Dhungel, Nishant, MD  polyethylene glycol (MIRALAX / GLYCOLAX) packet Take 17 g by mouth daily.     [provider]  senna-docusate (SENOKOT-S) 8.6-50 MG tablet Take 1 tablet by mouth at bedtime as needed for mild constipation. 04/13/19   Dhungel, Flonnie Overman, MD  simvastatin (ZOCOR) 40 MG tablet TAKE 1 TABLET BY MOUTH EVERY DAY 05/28/15   [provider]  spironolactone (ALDACTONE) 25 MG tablet TAKE 1/2 TABLET BY MOUTH EVERY DAY 09/06/15   [provider]    Allergies Dust mite extract and Sulfa antibiotics  Family History  Problem Relation Age of Onset   Breast cancer Paternal Aunt        74's   Hypertension Mother  Social History Social History   Tobacco Use   Smoking status: Former   Smokeless tobacco: Never   Tobacco comments:    quit over 50 yrs ago  Substance Use Topics   Alcohol use: Yes    Alcohol/week: 5.0 standard drinks    Types: 5 Glasses of wine per week    Comment:     Drug use: No    Review of Systems   Review of Systems  Respiratory:  Negative for shortness of breath.   Cardiovascular:  Negative for chest pain.  Musculoskeletal:  Positive for arthralgias, myalgias and neck pain.  Neurological:  Negative for syncope, weakness, numbness and headaches.  All other systems reviewed and are negative.  Physical Exam Updated Vital Signs BP (!) 127/53   Pulse 79   Temp 98.2 F (36.8 C) (Oral)   Resp 16   Ht 5\' 4"  (1.626 m)   Wt 65 kg   SpO2 93%   BMI 24.60 kg/m   Physical Exam Vitals and nursing note reviewed.  Constitutional:      General: She is not in acute distress.    Appearance: Normal appearance.  HENT:     Head: Normocephalic and atraumatic.  Eyes:     General: No scleral  icterus.    Conjunctiva/sclera: Conjunctivae normal.  Pulmonary:     Effort: Pulmonary effort is normal. No respiratory distress.     Breath sounds: No stridor.  Abdominal:     General: Abdomen is flat. There is no distension.     Palpations: Abdomen is soft.     Tenderness: There is no abdominal tenderness. There is no guarding.     Comments: Reducible umbilical hernia  Musculoskeletal:     Cervical back: Normal range of motion.     Comments: This is stable and nontender Significant shortening internal or external rotation of the leg Second pain with hip flexion, no significant pain with logrolling 2+ DP pulse, 5 strength with bilateral plantar flexion and dorsiflexion  Skin:    General: Skin is dry.     Coloration: Skin is not jaundiced or pale.  Neurological:     General: No focal deficit present.     Mental Status: She is alert and oriented to person, place, and time. Mental status is at baseline.  Psychiatric:        Mood and Affect: Mood normal.        Behavior: Behavior normal.     LABS (all labs ordered are listed, but only abnormal results are displayed)  Labs Reviewed  CBC WITH DIFFERENTIAL/PLATELET - Abnormal; Notable for the following components:      Result Value   RBC 3.47 (*)    Hemoglobin 11.4 (*)    HCT 34.2 (*)    Abs Immature Granulocytes 0.14 (*)    All other components within normal limits  RESP PANEL BY RT-PCR (FLU A&B, COVID) ARPGX2  COMPREHENSIVE METABOLIC PANEL   ____________________________________________  EKG  N/a ____________________________________________  RADIOLOGY Almeta Monas, personally viewed and evaluated these images (plain radiographs) as part of my medical decision making, as well as reviewing the written report by the radiologist.  ED MD interpretation: I reviewed the x-ray of the right hip and pelvis which shows a femoral neck fracture  I reviewed the CXR which does not show any acute cardiopulmonary process       ____________________________________________   PROCEDURES  Procedure(s) performed (including Critical Care):  Procedures   ____________________________________________   INITIAL IMPRESSION /  ASSESSMENT AND PLAN / ED COURSE   Patient is a 87 year old female who presents after mechanical fall today.  Fell onto her right hip and has significant right hip pain.  She actually had another fall after she was helped up because she was unable to bear weight on the hip due to pain.  On exam she overall appears comfortable.  There are no signs of trauma.  Her exam is notable for significant pain and decreased range of motion of the hip but no pelvic tenderness.  She is neurovascularly intact she has good DP pulses full strength with plantarflexion and dorsiflexion of the foot.  X-ray of the right hip does show a subcapital femoral neck fracture that is displaced.  Patient did not hit her head but is endorsing some mild neck pain so obtain a CT head and C-spine as well to be safe.  She will require admission.  Spoke with Dr. Mack Guise with orthopedics who discussed the case with Dr. Marcelino Scot over at [facility] and recommends that given the complicated nature of the fracture with her other femur hardware that she would be best with transfer to [facility].  They recommend she go ER to ER for ultimately hospitalist admission.  I signed out the patient to the ER attending at [facility].  She is stable for transfer.     ____________________________________________   FINAL CLINICAL IMPRESSION(S) / ED DIAGNOSES  Final diagnoses:  Closed fracture of neck of right femur, initial encounter [facility])     ED Discharge Orders     None        Note:  This document was prepared using Dragon voice recognition software and may include unintentional dictation errors.    Rada Hay, MD 11/28/20 2152

## 2020-11-28 NOTE — ED Provider Notes (Signed)
[facility] EMERGENCY DEPARTMENT Provider Note   CSN: [id number] Arrival date & time: 11/28/20  2257     History Chief Complaint  Patient presents with   Megan Salinas is a 87 y.o. female.  Patient transferred from outside hospital with right hip fracture.  States she had a trip and fall while trying to get to her door and not using her walker.  Landed on her right side.  Did hit her head but did not lose consciousness.  CT head and C-spine were negative at outside hospital.  Found to have a right femoral neck fracture and sent here for orthopedic evaluation. Denies any chest pain or shortness of breath.  No abdominal pain.  Takes aspirin but no other blood thinners  The history is provided by the patient and the EMS personnel.  Fall Associated symptoms include headaches. Pertinent negatives include no chest pain, no abdominal pain and no shortness of breath.      Past Medical History:  Diagnosis Date   Back pain    CAD (coronary artery disease)    CHF (congestive heart failure) (HCC)    GERD (gastroesophageal reflux disease)    High cholesterol    Hypertension    TIA (transient ischemic attack) 01/2016   "possible", no deficits   Wears hearing aid    bilateral    Patient Active Problem List   Diagnosis Date Noted   Lower limb ulcer, ankle, right, limited to breakdown of skin ([place]) 11/13/2020   Pain in limb 11/13/2020   Wears hearing aid in both ears 03/19/2020   Hemotympanum, left 10/04/2019   Sensorineural hearing loss of both ears 10/04/2019   Moderate aortic valve insufficiency 08/24/2019   Mild aortic stenosis 08/24/2019   Age-related osteoporosis with current pathological fracture with routine healing 04/18/2019   Displaced intertrochanteric fracture of left femur ([place]) 04/10/2019   Type 2 diabetes mellitus without complication ([place]) 70/26/3785   Preop examination 04/10/2019   Lymphedema 09/05/2018   Primary osteoarthritis of  right wrist 02/22/2018   Chest pain 02/28/2017   Venous insufficiency of right lower extremity 09/02/2016   Swelling of limb 08/15/2016   Frequent PVCs 03/12/2016   Transient global amnesia 02/26/2016   TIA (transient ischemic attack) 01/28/2016   Carpal tunnel syndrome, right 08/13/2015   DDD (degenerative disc disease), lumbar 09/12/2014   Lumbar radiculitis 09/12/2014   Lumbar stenosis with neurogenic claudication 09/12/2014   GERD (gastroesophageal reflux disease) 09/08/2014   History of type 2 diabetes mellitus 09/08/2014   Benign essential hypertension 06/16/2014   Moderate tricuspid insufficiency 06/07/2014   Abdominal pain, diffuse 88/50/2774   Chronic systolic CHF (congestive heart failure), NYHA class 3 ([place]) 12/05/2013   Moderate mitral insufficiency 12/05/2013   Coronary artery disease 03/29/2013   Mixed hyperlipidemia 03/29/2013    Past Surgical History:  Procedure Laterality Date   ABDOMINAL HYSTERECTOMY  1998   APPENDECTOMY     baldder tac  1995   BREAST CYST ASPIRATION Right    CARPAL TUNNEL RELEASE Right 03/26/2016   Procedure: CARPAL TUNNEL RELEASE ENDOSCOPIC  right;  Surgeon: Corky Mull, MD;  Location: [place];  Service: Orthopedics;  Laterality: Right;  bier block   CHOLECYSTECTOMY     CORONARY ARTERY BYPASS GRAFT  02/15/2010   [facility], 3 vessel   INTRAMEDULLARY (IM) NAIL INTERTROCHANTERIC Left 04/11/2019   Procedure: INTRAMEDULLARY (IM) NAIL INTERTROCHANTRIC;  Surgeon: Leim Fabry, MD;  Location: [facility];  Service: Orthopedics;  Laterality:  Left;   TONSILLECTOMY       OB History   No obstetric history on file.     Family History  Problem Relation Age of Onset   Breast cancer Paternal Aunt        46's   Hypertension Mother     Social History   Tobacco Use   Smoking status: Former   Smokeless tobacco: Never   Tobacco comments:    quit over 50 yrs ago  Substance Use Topics   Alcohol use: Yes    Alcohol/week: 5.0 standard drinks     Types: 5 Glasses of wine per week    Comment:     Drug use: No    Home Medications Prior to Admission medications   Medication Sig Start Date End Date Taking? Authorizing Provider  acetaminophen (TYLENOL) 500 MG tablet Take 500 mg by mouth every 6 (six) hours as needed for mild pain.     [provider]  amitriptyline (ELAVIL) 25 MG tablet Take 25 mg by mouth at bedtime.  09/12/15   [provider]  aspirin EC 81 MG tablet Take 81 mg by mouth daily.     [provider]  betamethasone dipropionate (DIPROLENE) 0.05 % ointment APPLY TO AFFECTED AREAS ON VULVA TWO TIMES WEEKLY 03/30/19   [provider]  cetirizine (ZYRTEC) 10 MG tablet Take 10 mg by mouth daily. 03/29/19   [provider]  docusate sodium (COLACE) 100 MG capsule Take 1 capsule (100 mg total) by mouth 2 (two) times daily. 04/13/19   Dhungel, Nishant, MD  enoxaparin (LOVENOX) 40 MG/0.4ML injection Inject 0.4 mLs (40 mg total) into the skin daily for 14 days. 04/12/19 04/26/19  Reche Dixon, PA-C  Ensure Max Protein (ENSURE MAX PROTEIN) LIQD Take 330 mLs (11 oz total) by mouth 2 (two) times daily between meals. 04/13/19   Dhungel, Flonnie Overman, MD  famotidine (PEPCID) 20 MG tablet Take 20 mg by mouth 2 (two) times daily.     [provider]  HYDROcodone-acetaminophen (NORCO/VICODIN) 5-325 MG tablet Take 1 tablet by mouth every 4 (four) hours as needed for moderate pain. 04/13/19   Dhungel, Flonnie Overman, MD  ibuprofen (ADVIL,MOTRIN) 200 MG tablet Take 200 mg by mouth every 6 (six) hours as needed for moderate pain.     [provider]  lisinopril (PRINIVIL,ZESTRIL) 20 MG tablet Take 20 mg by mouth daily.  05/19/16   [provider]  magnesium hydroxide (MILK OF MAGNESIA) 400 MG/5ML suspension Take 30 mLs by mouth daily as needed for mild constipation. 04/13/19   Dhungel, Flonnie Overman, MD  methocarbamol (ROBAXIN) 500 MG tablet Take 1 tablet (500 mg total) by mouth every 8 (eight) hours as  needed for muscle spasms. 04/13/19   Dhungel, Flonnie Overman, MD  metoprolol tartrate (LOPRESSOR) 25 MG tablet TAKE 1/2 TABLET BY MOUTH TWICE DAILY 06/26/15   [provider]  Multiple Vitamin (MULTI-VITAMINS) TABS Take 1 tablet by mouth daily.     [provider]  nystatin cream (MYCOSTATIN) Apply one application to affected area as needed. 09/10/15   [provider]  oxyCODONE (OXY IR/ROXICODONE) 5 MG immediate release tablet Take 0.5-1 tablets (2.5-5 mg total) by mouth every 4 (four) hours as needed for moderate pain or severe pain (pain score 4-6). 04/12/19   Reche Dixon, PA-C  pantoprazole (PROTONIX) 40 MG tablet Take 1 tablet (40 mg total) by mouth daily. 04/13/19   Dhungel, Nishant, MD  polyethylene glycol (MIRALAX / GLYCOLAX) packet Take 17 g by mouth  daily.     [provider]  senna-docusate (SENOKOT-S) 8.6-50 MG tablet Take 1 tablet by mouth at bedtime as needed for mild constipation. 04/13/19   Dhungel, Flonnie Overman, MD  simvastatin (ZOCOR) 40 MG tablet TAKE 1 TABLET BY MOUTH EVERY DAY 05/28/15   [provider]  spironolactone (ALDACTONE) 25 MG tablet TAKE 1/2 TABLET BY MOUTH EVERY DAY 09/06/15   [provider]    Allergies    Dust mite extract and Sulfa antibiotics  Review of Systems   Review of Systems  Constitutional:  Negative for activity change, appetite change and fever.  HENT:  Negative for congestion and rhinorrhea.   Respiratory:  Negative for cough, chest tightness and shortness of breath.   Cardiovascular:  Negative for chest pain.  Gastrointestinal:  Negative for abdominal pain, nausea and vomiting.  Genitourinary:  Negative for dysuria and hematuria.  Musculoskeletal:  Positive for arthralgias and myalgias.  Skin:  Negative for rash.  Neurological:  Positive for headaches. Negative for weakness.   all other systems are negative except as noted in the HPI and PMH.   Physical Exam Updated Vital Signs BP (!) 131/56 (BP Location:  Right Arm)   Pulse 80   Temp 98.1 F (36.7 C) (Oral)   Resp 16   Ht 5\' 4"  (1.626 m)   Wt 65 kg   SpO2 100%   BMI 24.60 kg/m   Physical Exam Vitals and nursing note reviewed.  Constitutional:      General: She is not in acute distress.    Appearance: She is well-developed.  HENT:     Head: Normocephalic and atraumatic.     Mouth/Throat:     Pharynx: No oropharyngeal exudate.  Eyes:     Conjunctiva/sclera: Conjunctivae normal.     Pupils: Pupils are equal, round, and reactive to light.  Neck:     Comments: No C spine tenderness  Cardiovascular:     Rate and Rhythm: Normal rate and regular rhythm.     Heart sounds: Normal heart sounds. No murmur heard. Pulmonary:     Effort: Pulmonary effort is normal. No respiratory distress.     Breath sounds: Normal breath sounds.  Chest:     Chest wall: No tenderness.  Abdominal:     Palpations: Abdomen is soft.     Tenderness: There is no abdominal tenderness. There is no guarding or rebound.  Musculoskeletal:        General: Tenderness present.     Cervical back: Normal range of motion and neck supple.     Comments: R hip shortened and externally rotated.  Intact DP and PT pulse with Doppler.  Skin:    General: Skin is warm.  Neurological:     Mental Status: She is alert and oriented to person, place, and time.     Cranial Nerves: No cranial nerve deficit.     Motor: No abnormal muscle tone.     Coordination: Coordination normal.     Comments:  5/5 strength throughout. CN 2-12 intact.Equal grip strength.   Psychiatric:        Behavior: Behavior normal.    ED Results / Procedures / Treatments   Labs (all labs ordered are listed, but only abnormal results are displayed) Labs Reviewed  MRSA NEXT GEN BY PCR, NASAL  CBC  BASIC METABOLIC PANEL  TYPE AND SCREEN    EKG EKG Interpretation  Date/Time:  Wednesday November 28 2020 23:30:43 EST Ventricular Rate:  83 PR Interval:  197 QRS  Duration: 101 QT  Interval:  418 QTC Calculation: 492 R Axis:   87 Text Interpretation: Sinus rhythm Consider left atrial enlargement Borderline right axis deviation Nonspecific repol abnormality, lateral leads Nonspecific ST abnormality Confirmed by Ezequiel Essex [phone]) on 11/28/2020 11:51:41 PM  Radiology DG Chest 1 View  Result Date: 11/28/2020 CLINICAL DATA:  Fall, right hip fracture, medical clearance EXAM: CHEST  1 VIEW COMPARISON:  04/10/2019 FINDINGS: Lungs are clear. No pneumothorax or pleural effusion. Coronary artery bypass grafting has been performed. Cardiac size is mildly enlarged, unchanged. Pulmonary vascularity is normal. No acute bone abnormality. IMPRESSION: No active disease.  Stable cardiomegaly. Electronically Signed   By: [name] M.D.   On: 11/28/2020 20:10   DG Knee 2 Views Right  Result Date: 11/28/2020 CLINICAL DATA:  Right knee fracture, fall EXAM: RIGHT KNEE - 1-2 VIEW COMPARISON:  None. FINDINGS: Two view radiograph right knee demonstrates normal alignment. No acute fracture or dislocation. Healed distal femoral diaphyseal fracture status post ORIF in anatomic alignment. 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 Vascular calcifications noted within the posterior soft tissues. Surgical clips and soft tissue defect noted within the medial aspect of the visualized right foreleg. IMPRESSION: No acute fracture or dislocation. Electronically Signed   By: [name] M.D.   On: 11/28/2020 20:46   CT HEAD WO CONTRAST (5MM)  Result Date: 11/28/2020 CLINICAL DATA:  Fall EXAM: CT HEAD WITHOUT CONTRAST CT CERVICAL SPINE WITHOUT CONTRAST TECHNIQUE: Multidetector CT imaging of the head and cervical spine was performed following the standard protocol without intravenous contrast. Multiplanar CT image reconstructions of the cervical spine were also generated. COMPARISON:  CT head and cervical spine 04/10/2019 FINDINGS: CT HEAD  FINDINGS Brain: Moderate generalized atrophy. Mild white matter hypodensity bilaterally. Negative for acute infarct, hemorrhage, or mass Vascular: Negative for hyperdense vessel Skull: Negative Sinuses/Orbits: Mild mucosal edema paranasal sinuses. Left mastoid effusion. Right mastoid clear. Bilateral cataract extraction Other: None CT CERVICAL SPINE FINDINGS Alignment: Normal Skull base and vertebrae: Negative for fracture in the cervical spine. Soft tissues and spinal canal: Multiple thyroid nodules, difficult to measure on unenhanced CT. Right thyroid nodule approximately 15 mL. Cystic nodule in the isthmus measuring 17 mm. Atherosclerotic calcification of the carotid artery bilaterally. Disc levels: Advanced degenerative changes C1-2 with prominent pannus posterior to the dens. Moderate left foraminal narrowing C3-4 due to spurring. Moderate foraminal narrowing bilaterally at C4-5 due to spurring with mild spinal stenosis Extensive spurring at C5-6 with moderate spinal stenosis and severe foraminal encroachment bilaterally. Probable cord compression. No change from the prior study. Moderate to severe spinal stenosis at C6-7 due to diffuse endplate spurring and probable calcified disc protrusion. No change from the prior CT. Moderate to severe foraminal encroachment bilaterally. Upper chest: Lung apices clear bilaterally Other: None IMPRESSION: 1. Atrophy and chronic microvascular ischemic change. No acute intracranial abnormality 2. Negative for cervical spine fracture 3. Advanced spondylosis. Moderate to severe spinal stenosis at C5-6 and C6-7. Multilevel foraminal encroachment bilaterally due to spurring. 4. Multiple thyroid nodules up to 17 mm diameter. (ref: J Am Coll Radiol. 2015 Feb;12(2): 143-50). Recommend thyroid ultrasound. Electronically Signed   By: Franchot Gallo M.D.   On: 11/28/2020 21:16   CT Cervical Spine Wo Contrast  Result Date: 11/28/2020 CLINICAL DATA:  Fall EXAM: CT HEAD WITHOUT  CONTRAST CT CERVICAL SPINE WITHOUT CONTRAST TECHNIQUE: Multidetector CT imaging of the head and cervical spine was performed following the standard protocol without  intravenous contrast. Multiplanar CT image reconstructions of the cervical spine were also generated. COMPARISON:  CT head and cervical spine 04/10/2019 FINDINGS: CT HEAD FINDINGS Brain: Moderate generalized atrophy. Mild white matter hypodensity bilaterally. Negative for acute infarct, hemorrhage, or mass Vascular: Negative for hyperdense vessel Skull: Negative Sinuses/Orbits: Mild mucosal edema paranasal sinuses. Left mastoid effusion. Right mastoid clear. Bilateral cataract extraction Other: None CT CERVICAL SPINE FINDINGS Alignment: Normal Skull base and vertebrae: Negative for fracture in the cervical spine. Soft tissues and spinal canal: Multiple thyroid nodules, difficult to measure on unenhanced CT. Right thyroid nodule approximately 15 mL. Cystic nodule in the isthmus measuring 17 mm. Atherosclerotic calcification of the carotid artery bilaterally. Disc levels: Advanced degenerative changes C1-2 with prominent pannus posterior to the dens. Moderate left foraminal narrowing C3-4 due to spurring. Moderate foraminal narrowing bilaterally at C4-5 due to spurring with mild spinal stenosis Extensive spurring at C5-6 with moderate spinal stenosis and severe foraminal encroachment bilaterally. Probable cord compression. No change from the prior study. Moderate to severe spinal stenosis at C6-7 due to diffuse endplate spurring and probable calcified disc protrusion. No change from the prior CT. Moderate to severe foraminal encroachment bilaterally. Upper chest: Lung apices clear bilaterally Other: None IMPRESSION: 1. Atrophy and chronic microvascular ischemic change. No acute intracranial abnormality 2. Negative for cervical spine fracture 3. Advanced spondylosis. Moderate to severe spinal stenosis at C5-6 and C6-7. Multilevel foraminal encroachment  bilaterally due to spurring. 4. Multiple thyroid nodules up to 17 mm diameter. (ref: J Am Coll Radiol. 2015 Feb;12(2): 143-50). Recommend thyroid ultrasound. Electronically Signed   By: Franchot Gallo M.D.   On: 11/28/2020 21:16   DG Hip Unilat W or Wo Pelvis 2-3 Views Right  Result Date: 11/28/2020 CLINICAL DATA:  Fall, right hip pain EXAM: DG HIP (WITH OR WITHOUT PELVIS) 2-3V RIGHT COMPARISON:  None. FINDINGS: Single view radiograph pelvis and two view radiograph right hip demonstrates an acute, displaced right subcapital femoral neck fracture withl roughly 1.5 cm superior displacement and external rotation of the distal fracture fragment. The right femoral head is still seated within the right acetabulum. Mild superimposed right hip degenerative arthritis. Right femoral ORIF has been performed with a partially visualized lateral plate and multiple cortical screws along the proximal right femoral diaphysis. Left hip ORIF has been performed. Pelvis appears intact. Degenerative changes are seen within the lumbar spine. Vascular calcifications are noted within the medial thighs and pelvis. IMPRESSION: Acute, displaced right subcapital femoral neck fracture. Pre-existing hardware partially visualized involving the proximal right femoral diaphysis. Electronically Signed   By: [name] M.D.   On: 11/28/2020 20:08    Procedures Procedures   Medications Ordered in ED Medications - No data to display  ED Course  I have reviewed the triage vital signs and the nursing notes.  Pertinent labs & imaging results that were available during my care of the patient were reviewed by me and considered in my medical decision making (see chart for details).    MDM Rules/Calculators/A&P                          Patient transferred from outside hospital with right femoral neck fracture.  Outside hospital x-rays and CT scans reviewed.  Outside lab work reviewed.  Plan admit to the hospital for orthopedic  consultation in the morning.  Outside hospital spoke with Dr. Marcelino Scot.  Admission discussed with Dr. Myna Hidalgo Final Clinical Impression(s) / ED Diagnoses Final diagnoses:  None  Rx / DC Orders ED Discharge Orders     None        Chukwuem[place]a Artola, Annie Main, MD 11/29/20 [phone]

## 2020-11-29 ENCOUNTER — Encounter (HOSPITAL_COMMUNITY)
Admission: EM | Disposition: A | Discharge status: Skilled Nursing Facility | Payer: Self-pay | Source: Home / Self Care | Attending: Internal Medicine

## 2020-11-29 ENCOUNTER — Inpatient Hospital Stay (HOSPITAL_COMMUNITY): Payer: Medicare Other

## 2020-11-29 ENCOUNTER — Encounter (HOSPITAL_COMMUNITY): Payer: Self-pay | Admitting: Family Medicine

## 2020-11-29 ENCOUNTER — Inpatient Hospital Stay (HOSPITAL_COMMUNITY): Payer: Medicare Other | Admitting: Certified Registered Nurse Anesthetist

## 2020-11-29 DIAGNOSIS — D497 Neoplasm of unspecified behavior of endocrine glands and other parts of nervous system: Secondary | ICD-10-CM | POA: Diagnosis present

## 2020-11-29 HISTORY — PX: HIP ARTHROPLASTY: SHX981

## 2020-11-29 LAB — CBC
HCT: 32.1 % — ABNORMAL LOW (ref 36.0–46.0)
Hemoglobin: 10.5 g/dL — ABNORMAL LOW (ref 12.0–15.0)
MCH: 31.8 pg (ref 26.0–34.0)
MCHC: 32.7 g/dL (ref 30.0–36.0)
MCV: 97.3 fL (ref 80.0–100.0)
Platelets: 206 10*3/uL (ref 150–400)
RBC: 3.3 MIL/uL — ABNORMAL LOW (ref 3.87–5.11)
RDW: 14.7 % (ref 11.5–15.5)
WBC: 8.9 10*3/uL (ref 4.0–10.5)
nRBC: 0 % (ref 0.0–0.2)

## 2020-11-29 LAB — GLUCOSE, CAPILLARY
Glucose-Capillary: 160 mg/dL — ABNORMAL HIGH (ref 70–99)
Glucose-Capillary: 135 mg/dL — ABNORMAL HIGH (ref 70–99)
Glucose-Capillary: 87 mg/dL (ref 70–99)
Glucose-Capillary: 146 mg/dL — ABNORMAL HIGH (ref 70–99)
Glucose-Capillary: 148 mg/dL — ABNORMAL HIGH (ref 70–99)

## 2020-11-29 LAB — BASIC METABOLIC PANEL
Anion gap: 9 (ref 5–15)
BUN: 11 mg/dL (ref 8–23)
CO2: 21 mmol/L — ABNORMAL LOW (ref 22–32)
Calcium: 9 mg/dL (ref 8.9–10.3)
Chloride: 103 mmol/L (ref 98–111)
Creatinine, Ser: 0.57 mg/dL (ref 0.44–1.00)
GFR, Estimated: >60 mL/min (ref >60)
Glucose, Bld: 165 mg/dL — ABNORMAL HIGH (ref 70–99)
Potassium: 4 mmol/L (ref 3.5–5.1)
Sodium: 133 mmol/L — ABNORMAL LOW (ref 135–145)

## 2020-11-29 LAB — MRSA NEXT GEN BY PCR, NASAL: MRSA by PCR Next Gen: POSITIVE — AB

## 2020-11-29 LAB — TYPE AND SCREEN
ABO/RH(D): B POS
Antibody Screen: NEGATIVE

## 2020-11-29 LAB — ABO/RH: ABO/RH(D): B POS

## 2020-11-29 SURGERY — HEMIARTHROPLASTY, HIP, DIRECT ANTERIOR APPROACH, FOR FRACTURE
Anesthesia: General | Site: Hip | Laterality: Right

## 2020-11-29 MED ORDER — PROPOFOL 500 MG/50ML IV EMUL
INTRAVENOUS | Status: DC | PRN
  Administered 2020-11-29: 50 ug/kg/min via INTRAVENOUS

## 2020-11-29 MED ORDER — PHENOL 1.4 % MT LIQD: 1.0000 | OROMUCOSAL | Status: DC | PRN

## 2020-11-29 MED ORDER — CEFAZOLIN SODIUM-DEXTROSE 2-4 GM/100ML-% IV SOLN
2.0000 g | Freq: Four times a day (QID) | INTRAVENOUS | Status: AC
  Administered 2020-11-29 (×2): 2 g via INTRAVENOUS
  Filled 2020-11-29 (×2): qty 100

## 2020-11-29 MED ORDER — SODIUM CHLORIDE 0.9 % IV SOLN: INTRAVENOUS | Status: AC

## 2020-11-29 MED ORDER — TRANEXAMIC ACID-NACL 1000-0.7 MG/100ML-% IV SOLN
1000.0000 mg | Freq: Once | INTRAVENOUS | Status: AC
  Administered 2020-11-29: 17:00:00 1000 mg via INTRAVENOUS
  Filled 2020-11-29: qty 100

## 2020-11-29 MED ORDER — PROPOFOL 10 MG/ML IV BOLUS
INTRAVENOUS | Status: AC
  Filled 2020-11-29: qty 20

## 2020-11-29 MED ORDER — METHOCARBAMOL 500 MG PO TABS: 500.0000 mg | ORAL_TABLET | Freq: Four times a day (QID) | ORAL | Status: DC | PRN

## 2020-11-29 MED ORDER — PANTOPRAZOLE SODIUM 40 MG PO TBEC: 40.0000 mg | DELAYED_RELEASE_TABLET | Freq: Every day | ORAL | Status: DC

## 2020-11-29 MED ORDER — PANTOPRAZOLE SODIUM 40 MG PO TBEC
40.0000 mg | DELAYED_RELEASE_TABLET | Freq: Every day | ORAL | Status: DC
  Administered 2020-11-29 – 2020-12-03 (×5): 40 mg via ORAL
  Filled 2020-11-29 (×5): qty 1

## 2020-11-29 MED ORDER — METHOCARBAMOL 500 MG PO TABS: 500.0000 mg | ORAL_TABLET | Freq: Three times a day (TID) | ORAL | Status: DC | PRN

## 2020-11-29 MED ORDER — FENTANYL CITRATE (PF) 100 MCG/2ML IJ SOLN: 25.0000 ug | INTRAMUSCULAR | Status: DC | PRN

## 2020-11-29 MED ORDER — SENNOSIDES-DOCUSATE SODIUM 8.6-50 MG PO TABS
1.0000 | ORAL_TABLET | Freq: Every evening | ORAL | Status: DC | PRN
  Filled 2020-11-29: qty 1

## 2020-11-29 MED ORDER — LACTATED RINGERS IV SOLN: INTRAVENOUS | Status: DC

## 2020-11-29 MED ORDER — ORAL CARE MOUTH RINSE: 15.0000 mL | Freq: Once | OROMUCOSAL | Status: AC

## 2020-11-29 MED ORDER — METHOCARBAMOL 1000 MG/10ML IJ SOLN
500.0000 mg | Freq: Four times a day (QID) | INTRAVENOUS | Status: DC | PRN
  Filled 2020-11-29 (×3): qty 5

## 2020-11-29 MED ORDER — ONDANSETRON HCL 4 MG/2ML IJ SOLN
INTRAMUSCULAR | Status: AC
  Filled 2020-11-29: qty 2

## 2020-11-29 MED ORDER — FENTANYL CITRATE (PF) 250 MCG/5ML IJ SOLN
INTRAMUSCULAR | Status: AC
  Filled 2020-11-29: qty 5

## 2020-11-29 MED ORDER — HYDROCODONE-ACETAMINOPHEN 5-325 MG PO TABS: 1.0000 | ORAL_TABLET | ORAL | Status: DC | PRN

## 2020-11-29 MED ORDER — PROPOFOL 10 MG/ML IV BOLUS
INTRAVENOUS | Status: DC | PRN
  Administered 2020-11-29: 40 mg via INTRAVENOUS
  Administered 2020-11-29: 20 mg via INTRAVENOUS

## 2020-11-29 MED ORDER — ENOXAPARIN SODIUM 40 MG/0.4ML IJ SOSY
40.0000 mg | PREFILLED_SYRINGE | INTRAMUSCULAR | Status: DC
  Administered 2020-11-30 – 2020-12-03 (×4): 40 mg via SUBCUTANEOUS
  Filled 2020-11-29 (×4): qty 0.4

## 2020-11-29 MED ORDER — ACETAMINOPHEN 500 MG PO TABS
1000.0000 mg | ORAL_TABLET | Freq: Once | ORAL | Status: AC
  Administered 2020-11-29: 11:00:00 1000 mg via ORAL
  Filled 2020-11-29: qty 2

## 2020-11-29 MED ORDER — DEXMEDETOMIDINE (PRECEDEX) IN NS 20 MCG/5ML (4 MCG/ML) IV SYRINGE
PREFILLED_SYRINGE | INTRAVENOUS | Status: DC | PRN
  Administered 2020-11-29 (×2): 4 ug via INTRAVENOUS

## 2020-11-29 MED ORDER — FAMOTIDINE 20 MG PO TABS: 20.0000 mg | ORAL_TABLET | Freq: Every day | ORAL | Status: DC

## 2020-11-29 MED ORDER — DOCUSATE SODIUM 100 MG PO CAPS: 100.0000 mg | ORAL_CAPSULE | Freq: Two times a day (BID) | ORAL | Status: DC

## 2020-11-29 MED ORDER — ONDANSETRON HCL 4 MG PO TABS: 4.0000 mg | ORAL_TABLET | Freq: Four times a day (QID) | ORAL | Status: DC | PRN

## 2020-11-29 MED ORDER — METOPROLOL TARTRATE 12.5 MG HALF TABLET
12.5000 mg | ORAL_TABLET | Freq: Two times a day (BID) | ORAL | Status: DC
  Administered 2020-11-29 – 2020-12-03 (×7): 12.5 mg via ORAL
  Filled 2020-11-29 (×9): qty 1

## 2020-11-29 MED ORDER — ONDANSETRON HCL 4 MG/2ML IJ SOLN
INTRAMUSCULAR | Status: DC | PRN
  Administered 2020-11-29: 4 mg via INTRAVENOUS

## 2020-11-29 MED ORDER — METOCLOPRAMIDE HCL 10 MG PO TABS
5.0000 mg | ORAL_TABLET | Freq: Three times a day (TID) | ORAL | Status: DC | PRN
  Filled 2020-11-29: qty 1

## 2020-11-29 MED ORDER — POLYETHYLENE GLYCOL 3350 17 G PO PACK
17.0000 g | PACK | Freq: Two times a day (BID) | ORAL | Status: DC
  Administered 2020-11-29: 21:00:00 17 g via ORAL
  Filled 2020-11-29: qty 1

## 2020-11-29 MED ORDER — DOCUSATE SODIUM 100 MG PO CAPS
100.0000 mg | ORAL_CAPSULE | Freq: Two times a day (BID) | ORAL | Status: DC
  Administered 2020-11-29 – 2020-12-03 (×8): 100 mg via ORAL
  Filled 2020-11-29 (×8): qty 1

## 2020-11-29 MED ORDER — ONDANSETRON HCL 4 MG/2ML IJ SOLN: 4.0000 mg | Freq: Four times a day (QID) | INTRAMUSCULAR | Status: DC | PRN

## 2020-11-29 MED ORDER — METOCLOPRAMIDE HCL 5 MG/ML IJ SOLN: 5.0000 mg | Freq: Three times a day (TID) | INTRAMUSCULAR | Status: DC | PRN

## 2020-11-29 MED ORDER — MORPHINE SULFATE (PF) 2 MG/ML IV SOLN
1.0000 mg | INTRAVENOUS | Status: DC | PRN
  Administered 2020-12-03: 1 mg via INTRAVENOUS
  Filled 2020-11-29: qty 1

## 2020-11-29 MED ORDER — 0.9 % SODIUM CHLORIDE (POUR BTL) OPTIME
TOPICAL | Status: DC | PRN
  Administered 2020-11-29: 12:00:00 1000 mL

## 2020-11-29 MED ORDER — CHLORHEXIDINE GLUCONATE 0.12 % MT SOLN: 15.0000 mL | Freq: Once | OROMUCOSAL | Status: AC

## 2020-11-29 MED ORDER — INSULIN ASPART 100 UNIT/ML IJ SOLN
0.0000 [IU] | INTRAMUSCULAR | Status: DC
  Administered 2020-11-29 (×3): 1 [IU] via SUBCUTANEOUS
  Administered 2020-11-29: 05:00:00 2 [IU] via SUBCUTANEOUS
  Administered 2020-11-30 (×2): 3 [IU] via SUBCUTANEOUS
  Administered 2020-11-30: 1 [IU] via SUBCUTANEOUS
  Administered 2020-11-30: 3 [IU] via SUBCUTANEOUS
  Administered 2020-12-01: 2 [IU] via SUBCUTANEOUS
  Administered 2020-12-01: 1 [IU] via SUBCUTANEOUS
  Administered 2020-12-01: 3 [IU] via SUBCUTANEOUS
  Administered 2020-12-01: 2 [IU] via SUBCUTANEOUS
  Administered 2020-12-01: 3 [IU] via SUBCUTANEOUS
  Administered 2020-12-01 – 2020-12-02 (×2): 2 [IU] via SUBCUTANEOUS
  Administered 2020-12-02: 1 [IU] via SUBCUTANEOUS
  Administered 2020-12-02: 3 [IU] via SUBCUTANEOUS
  Administered 2020-12-02: 1 [IU] via SUBCUTANEOUS
  Administered 2020-12-02 – 2020-12-03 (×2): 2 [IU] via SUBCUTANEOUS
  Administered 2020-12-03 (×2): 1 [IU] via SUBCUTANEOUS

## 2020-11-29 MED ORDER — FAMOTIDINE 20 MG PO TABS
20.0000 mg | ORAL_TABLET | Freq: Two times a day (BID) | ORAL | Status: DC
  Administered 2020-11-29 – 2020-12-03 (×9): 20 mg via ORAL
  Filled 2020-11-29 (×9): qty 1

## 2020-11-29 MED ORDER — PHENYLEPHRINE HCL-NACL 20-0.9 MG/250ML-% IV SOLN
INTRAVENOUS | Status: DC | PRN
  Administered 2020-11-29: 20 ug/min via INTRAVENOUS

## 2020-11-29 MED ORDER — CEFAZOLIN SODIUM-DEXTROSE 2-4 GM/100ML-% IV SOLN
2.0000 g | Freq: Once | INTRAVENOUS | Status: AC
  Administered 2020-11-29: 11:00:00 2 g via INTRAVENOUS
  Filled 2020-11-29: qty 100

## 2020-11-29 MED ORDER — MENTHOL 3 MG MT LOZG: 1.0000 | LOZENGE | OROMUCOSAL | Status: DC | PRN

## 2020-11-29 MED ORDER — CHLORHEXIDINE GLUCONATE 0.12 % MT SOLN
OROMUCOSAL | Status: AC
  Administered 2020-11-29: 11:00:00 15 mL
  Filled 2020-11-29: qty 15

## 2020-11-29 MED ORDER — SIMVASTATIN 20 MG PO TABS
40.0000 mg | ORAL_TABLET | Freq: Every day | ORAL | Status: DC
  Administered 2020-11-29 – 2020-12-03 (×5): 40 mg via ORAL
  Filled 2020-11-29 (×5): qty 2

## 2020-11-29 MED ORDER — POLYETHYLENE GLYCOL 3350 17 G PO PACK: 17.0000 g | PACK | Freq: Two times a day (BID) | ORAL | Status: DC

## 2020-11-29 SURGICAL SUPPLY — 61 items
BAG COUNTER SPONGE SURGICOUNT (BAG) ×2 IMPLANT
BAG SPNG CNTER NS LX DISP (BAG) ×1
BLADE SAW SAG 73X25 THK (BLADE) ×1
BLADE SAW SGTL 73X25 THK (BLADE) ×1 IMPLANT
BRUSH SCRUB EZ PLAIN DRY (MISCELLANEOUS) ×3 IMPLANT
CATH FOLEY 2WAY SLVR  5CC 14FR (CATHETERS) ×2
CATH FOLEY 2WAY SLVR 30CC 16FR (CATHETERS) ×1 IMPLANT
CATH FOLEY 2WAY SLVR 5CC 14FR (CATHETERS) IMPLANT
COVER SURGICAL LIGHT HANDLE (MISCELLANEOUS) ×2 IMPLANT
DRAPE C-ARM 42X72 X-RAY (DRAPES) ×1 IMPLANT
DRAPE C-ARMOR (DRAPES) ×1 IMPLANT
DRAPE INCISE IOBAN 85X60 (DRAPES) ×2 IMPLANT
DRAPE ORTHO SPLIT 77X108 STRL (DRAPES) ×4
DRAPE SURG ORHT 6 SPLT 77X108 (DRAPES) ×2 IMPLANT
DRAPE U-SHAPE 47X51 STRL (DRAPES) ×2 IMPLANT
DRSG AQUACEL AG ADV 3.5X14 (GAUZE/BANDAGES/DRESSINGS) ×1 IMPLANT
DRSG MEPILEX BORDER 4X8 (GAUZE/BANDAGES/DRESSINGS) ×2 IMPLANT
ELECT BLADE 6.5 EXT (BLADE) IMPLANT
ELECT CAUTERY BLADE 6.4 (BLADE) IMPLANT
ELECT REM PT RETURN 9FT ADLT (ELECTROSURGICAL) ×2
ELECTRODE REM PT RTRN 9FT ADLT (ELECTROSURGICAL) ×1 IMPLANT
GLOVE SRG 8 PF TXTR STRL LF DI (GLOVE) ×2 IMPLANT
GLOVE SURG ENC MOIS LTX SZ7.5 (GLOVE) ×2 IMPLANT
GLOVE SURG ENC MOIS LTX SZ8 (GLOVE) ×2 IMPLANT
GLOVE SURG UNDER POLY LF SZ8 (GLOVE) ×4
GOWN STRL REUS W/ TWL LRG LVL3 (GOWN DISPOSABLE) ×1 IMPLANT
GOWN STRL REUS W/ TWL XL LVL3 (GOWN DISPOSABLE) ×1 IMPLANT
GOWN STRL REUS W/TWL LRG LVL3 (GOWN DISPOSABLE) ×8
GOWN STRL REUS W/TWL XL LVL3 (GOWN DISPOSABLE) ×2
HANDPIECE INTERPULSE COAX TIP (DISPOSABLE)
HEAD FEM UNIPOLAR 48 OD (Hips) ×1 IMPLANT
KIT BASIN OR (CUSTOM PROCEDURE TRAY) ×2 IMPLANT
KIT TURNOVER KIT B (KITS) ×2 IMPLANT
MANIFOLD NEPTUNE II (INSTRUMENTS) ×1 IMPLANT
NDL 1/2 CIR MAYO (NEEDLE) IMPLANT
NEEDLE 1/2 CIR MAYO (NEEDLE) IMPLANT
NS IRRIG 1000ML POUR BTL (IV SOLUTION) ×2 IMPLANT
PACK TOTAL JOINT (CUSTOM PROCEDURE TRAY) ×2 IMPLANT
PAD ARMBOARD 7.5X6 YLW CONV (MISCELLANEOUS) ×4 IMPLANT
PILLOW ABDUCTION MEDIUM (MISCELLANEOUS) IMPLANT
PILLOW FOAM RUBBER ADULT (PILLOWS) ×1 IMPLANT
RETRIEVER SUT HEWSON (MISCELLANEOUS) ×2 IMPLANT
SET HNDPC FAN SPRY TIP SCT (DISPOSABLE) IMPLANT
SPACER FEM TAPERED +0 12/14 (Hips) ×1 IMPLANT
STAPLER VISISTAT 35W (STAPLE) ×2 IMPLANT
STEM SUMMIT PRESSFIT SZ 6 (Hips) ×1 IMPLANT
SUT ETHILON 2 0 PSLX (SUTURE) ×5 IMPLANT
SUT FIBERWIRE #2 38 T-5 BLUE (SUTURE) ×4
SUT VIC AB 0 CT1 27 (SUTURE) ×2
SUT VIC AB 0 CT1 27XBRD ANBCTR (SUTURE) IMPLANT
SUT VIC AB 1 CT1 18XCR BRD 8 (SUTURE) ×1 IMPLANT
SUT VIC AB 1 CT1 27 (SUTURE) ×4
SUT VIC AB 1 CT1 27XBRD ANBCTR (SUTURE) ×2 IMPLANT
SUT VIC AB 1 CT1 36 (SUTURE) ×2 IMPLANT
SUT VIC AB 1 CT1 8-18 (SUTURE) ×2
SUT VIC AB 2-0 CT1 27 (SUTURE) ×6
SUT VIC AB 2-0 CT1 TAPERPNT 27 (SUTURE) ×2 IMPLANT
SUTURE FIBERWR #2 38 T-5 BLUE (SUTURE) ×2 IMPLANT
TOWEL GREEN STERILE (TOWEL DISPOSABLE) ×2 IMPLANT
TOWEL GREEN STERILE FF (TOWEL DISPOSABLE) ×2 IMPLANT
WATER STERILE IRR 1000ML POUR (IV SOLUTION) ×2 IMPLANT

## 2020-11-29 NOTE — Anesthesia Procedure Notes (Signed)
Date/Time: 11/29/2020 11:20 AM Performed by: Michele Rockers, CRNA Pre-anesthesia Checklist: Patient identified, Emergency Drugs available, Suction available, Timeout performed and Patient being monitored Patient Re-evaluated:Patient Re-evaluated prior to induction Oxygen Delivery Method: Simple face mask

## 2020-11-29 NOTE — Anesthesia Preprocedure Evaluation (Addendum)
Anesthesia Evaluation  Patient identified by MRN, date of birth, ID band Patient awake    Reviewed: Allergy & Precautions, NPO status , Patient's Chart, lab work & pertinent test results, reviewed documented beta blocker date and time   Airway Mallampati: I  TM Distance: >3 FB Neck ROM: Full    Dental  (+) Edentulous Upper, Dental Advisory Given   Pulmonary neg pulmonary ROS, former smoker,    Pulmonary exam normal breath sounds clear to auscultation       Cardiovascular hypertension, Pt. on home beta blockers and Pt. on medications + CAD, + CABG and +CHF  Normal cardiovascular exam Rhythm:Regular Rate:Normal  TTE 2021 1. Left ventricular ejection fraction, by estimation, is 50 to 55%. Left  ventricular ejection fraction by 2D MOD biplane is 54.6 %. The left  ventricle has low normal function. The left ventricle demonstrates  regional wall motion abnormalities (see  scoring diagram/findings for description). Left ventricular diastolic  parameters are consistent with Grade I diastolic dysfunction (impaired  relaxation).  2. Right ventricular systolic function is normal. The right ventricular  size is normal.  3. The mitral valve is grossly normal. Trivial mitral valve  regurgitation.  4. The aortic valve is grossly normal. Aortic valve regurgitation is mild  to moderate. Mild aortic valve stenosis.    Neuro/Psych TIAnegative psych ROS   GI/Hepatic Neg liver ROS, GERD  Medicated and Controlled,  Endo/Other  negative endocrine ROS  Renal/GU negative Renal ROS  negative genitourinary   Musculoskeletal negative musculoskeletal ROS (+)   Abdominal   Peds  Hematology  (+) Blood dyscrasia (Hgb 10.5), anemia ,   Anesthesia Other Findings   Reproductive/Obstetrics                            Anesthesia Physical Anesthesia Plan  ASA: 3  Anesthesia Plan: Spinal   Post-op Pain Management:     Induction: Intravenous  PONV Risk Score and Plan: 2 and Dexamethasone, Ondansetron, Treatment may vary due to age or medical condition and TIVA  Airway Management Planned: Natural Airway and Mask  Additional Equipment:   Intra-op Plan:   Post-operative Plan: Extubation in OR  Informed Consent: I have reviewed the patients History and Physical, chart, labs and discussed the procedure including the risks, benefits and alternatives for the proposed anesthesia with the patient or authorized representative who has indicated his/her understanding and acceptance.     Dental advisory given  Plan Discussed with: CRNA  Anesthesia Plan Comments:        Anesthesia Quick Evaluation

## 2020-11-29 NOTE — ED Notes (Signed)
Admitting Provider at bedside. 

## 2020-11-29 NOTE — OR Nursing (Signed)
Foley attempted x 2.  No success.

## 2020-11-29 NOTE — Progress Notes (Signed)
Patient arrived to unit from ED. Patient Aox4, on RA, vitals WNL and CHG completed.   Arrived to unit with bilateral hearing aids, upper dentures and personal items (purse, clothes, tablet) kept with daughter who is at bedside.   Oriented patient to unit. Bed in the lowest postion, call bell in reach, bed alarm on and safety mat on floor.

## 2020-11-29 NOTE — Consult Note (Signed)
Orthopaedic Trauma Service (OTS) Consultation   Patient ID: Megan Salinas MRN: [id number] DOB/AGE: 07/09/1926 87 y.o.   Reason for Consult: displaced right femoral neck fracture Referring Physician: Noberto Retort, MD  HPI: Megan Salinas is an 87 y.o. female who fell with acute right hip pain, inability to bear weight. Denies LOC other injury.   Past Medical History:  Diagnosis Date   Back pain    CAD (coronary artery disease)    CHF (congestive heart failure) (HCC)    GERD (gastroesophageal reflux disease)    High cholesterol    Hypertension    TIA (transient ischemic attack) 01/2016   "possible", no deficits   Wears hearing aid    bilateral    Past Surgical History:  Procedure Laterality Date   ABDOMINAL HYSTERECTOMY  1998   APPENDECTOMY     baldder tac  1995   BREAST CYST ASPIRATION Right    CARPAL TUNNEL RELEASE Right 03/26/2016   Procedure: CARPAL TUNNEL RELEASE ENDOSCOPIC  right;  Surgeon: Corky Mull, MD;  Location: [facility];  Service: Orthopedics;  Laterality: Right;  bier block   CHOLECYSTECTOMY     CORONARY ARTERY BYPASS GRAFT  02/15/2010   [facility], 3 vessel   INTRAMEDULLARY (IM) NAIL INTERTROCHANTERIC Left 04/11/2019   Procedure: INTRAMEDULLARY (IM) NAIL INTERTROCHANTRIC;  Surgeon: Leim Fabry, MD;  Location: [facility];  Service: Orthopedics;  Laterality: Left;   TONSILLECTOMY      Family History  Problem Relation Age of Onset   Breast cancer Paternal Aunt        17's   Hypertension Mother     Social History:  reports that she has quit smoking. She has never used smokeless tobacco. She reports current alcohol use of about 5.0 standard drinks per week. She reports that she does not use drugs.  Allergies:  Allergies  Allergen Reactions   Dust Mite Extract Other (See Comments)    Runny nose   Sulfa Antibiotics Itching and Rash    Red, itchy palms    Medications: Prior to Admission:  Medications Prior to Admission  Medication Sig  Dispense Refill Last Dose   acetaminophen (TYLENOL) 500 MG tablet Take 500 mg by mouth every 6 (six) hours as needed for mild pain.       amitriptyline (ELAVIL) 25 MG tablet Take 25 mg by mouth at bedtime.       aspirin EC 81 MG tablet Take 81 mg by mouth daily.       betamethasone dipropionate (DIPROLENE) 0.05 % ointment APPLY TO AFFECTED AREAS ON VULVA TWO TIMES WEEKLY      cetirizine (ZYRTEC) 10 MG tablet Take 10 mg by mouth daily.      docusate sodium (COLACE) 100 MG capsule Take 1 capsule (100 mg total) by mouth 2 (two) times daily. 10 capsule 0    enoxaparin (LOVENOX) 40 MG/0.4ML injection Inject 0.4 mLs (40 mg total) into the skin daily for 14 days. 5.6 mL 0    Ensure Max Protein (ENSURE MAX PROTEIN) LIQD Take 330 mLs (11 oz total) by mouth 2 (two) times daily between meals. 330 mL 0    famotidine (PEPCID) 20 MG tablet Take 20 mg by mouth 2 (two) times daily.       HYDROcodone-acetaminophen (NORCO/VICODIN) 5-325 MG tablet Take 1 tablet by mouth every 4 (four) hours as needed for moderate pain. 20 tablet 0    ibuprofen (ADVIL,MOTRIN) 200 MG tablet Take 200 mg by  mouth every 6 (six) hours as needed for moderate pain.       lisinopril (PRINIVIL,ZESTRIL) 20 MG tablet Take 20 mg by mouth daily.   11    magnesium hydroxide (MILK OF MAGNESIA) 400 MG/5ML suspension Take 30 mLs by mouth daily as needed for mild constipation. 355 mL 0    methocarbamol (ROBAXIN) 500 MG tablet Take 1 tablet (500 mg total) by mouth every 8 (eight) hours as needed for muscle spasms. 20 tablet 0    metoprolol tartrate (LOPRESSOR) 25 MG tablet TAKE 1/2 TABLET BY MOUTH TWICE DAILY      Multiple Vitamin (MULTI-VITAMINS) TABS Take 1 tablet by mouth daily.       nystatin cream (MYCOSTATIN) Apply one application to affected area as needed.      oxyCODONE (OXY IR/ROXICODONE) 5 MG immediate release tablet Take 0.5-1 tablets (2.5-5 mg total) by mouth every 4 (four) hours as needed for moderate pain or severe pain (pain score 4-6).  30 tablet 0    pantoprazole (PROTONIX) 40 MG tablet Take 1 tablet (40 mg total) by mouth daily. 60 tablet 0    polyethylene glycol (MIRALAX / GLYCOLAX) packet Take 17 g by mouth daily.       senna-docusate (SENOKOT-S) 8.6-50 MG tablet Take 1 tablet by mouth at bedtime as needed for mild constipation. 15 tablet 0    simvastatin (ZOCOR) 40 MG tablet TAKE 1 TABLET BY MOUTH EVERY DAY      spironolactone (ALDACTONE) 25 MG tablet TAKE 1/2 TABLET BY MOUTH EVERY DAY       Results for orders placed or performed during the hospital encounter of 11/28/20 (from the past 48 hour(s))  MRSA Next Gen by PCR, Nasal     Status: Abnormal   Collection Time: 11/29/20  2:37 AM   Specimen: Nasal Mucosa; Nasal Swab  Result Value Ref Range   MRSA by PCR Next Gen POSITIVE (A) NOT DETECTED    Comment: RESULT CALLED TO, READ BACK BY AND VERIFIED WITH: RN DAJA 11/29/20@4 :20 BY TW Performed at [facility], [place] [address], [place], [place] [address]   CBC     Status: Abnormal   Collection Time: 11/29/20  2:55 AM  Result Value Ref Range   WBC 8.9 4.0 - 10.5 K/uL   RBC 3.30 (L) 3.87 - 5.11 MIL/uL   Hemoglobin 10.5 (L) 12.0 - 15.0 g/dL   HCT 32.1 (L) 36.0 - 46.0 %   MCV 97.3 80.0 - 100.0 fL   MCH 31.8 26.0 - 34.0 pg   MCHC 32.7 30.0 - 36.0 g/dL   RDW 14.7 11.5 - 15.5 %   Platelets 206 150 - 400 K/uL   nRBC 0.0 0.0 - 0.2 %    Comment: Performed at [facility], [place] [address], [place], [place] [address]  Basic metabolic panel     Status: Abnormal   Collection Time: 11/29/20  2:55 AM  Result Value Ref Range   Sodium 133 (L) 135 - 145 mmol/L   Potassium 4.0 3.5 - 5.1 mmol/L   Chloride 103 98 - 111 mmol/L   CO2 21 (L) 22 - 32 mmol/L   Glucose, Bld 165 (H) 70 - 99 mg/dL    Comment: Glucose reference range applies only to samples taken after fasting for at least 8 hours.   BUN 11 8 - 23 mg/dL   Creatinine, Ser 0.57 0.44 - 1.00 mg/dL   Calcium 9.0 8.9 - 10.3 mg/dL   GFR, Estimated >60 >  60 mL/min     Comment: (NOTE) Calculated using the CKD-EPI Creatinine Equation (2021)    Anion gap 9 5 - 15    Comment: Performed at [facility], [place] [address] [place], [place] [address]  Type and screen College     Status: None   Collection Time: 11/29/20  2:55 AM  Result Value Ref Range   ABO/RH(D) B POS    Antibody Screen NEG    Sample Expiration      12/02/2020,2359 Performed at [facility], [place] [address], [place], [place] [address]   Glucose, capillary     Status: Abnormal   Collection Time: 11/29/20  4:11 AM  Result Value Ref Range   Glucose-Capillary 160 (H) 70 - 99 mg/dL    Comment: Glucose reference range applies only to samples taken after fasting for at least 8 hours.  ABO/Rh     Status: None   Collection Time: 11/29/20  4:20 AM  Result Value Ref Range   ABO/RH(D)      B POS Performed at [facility], [place] [address], [place], [place] [address]   Glucose, capillary     Status: Abnormal   Collection Time: 11/29/20  8:13 AM  Result Value Ref Range   Glucose-Capillary 135 (H) 70 - 99 mg/dL    Comment: Glucose reference range applies only to samples taken after fasting for at least 8 hours.    DG Chest 1 View  Result Date: 11/28/2020 CLINICAL DATA:  Fall, right hip fracture, medical clearance EXAM: CHEST  1 VIEW COMPARISON:  04/10/2019 FINDINGS: Lungs are clear. No pneumothorax or pleural effusion. Coronary artery bypass grafting has been performed. Cardiac size is mildly enlarged, unchanged. Pulmonary vascularity is normal. No acute bone abnormality. IMPRESSION: No active disease.  Stable cardiomegaly. Electronically Signed   By: [name] M.D.   On: 11/28/2020 20:10   DG Knee 2 Views Right  Result Date: 11/28/2020 CLINICAL DATA:  Right knee fracture, fall EXAM: RIGHT KNEE - 1-2 VIEW COMPARISON:  None. FINDINGS: Two view radiograph right knee demonstrates normal alignment. No acute fracture or dislocation. Healed distal femoral  diaphyseal fracture status post ORIF in anatomic alignment. 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 Vascular calcifications noted within the posterior soft tissues. Surgical clips and soft tissue defect noted within the medial aspect of the visualized right foreleg. IMPRESSION: No acute fracture or dislocation. Electronically Signed   By: [name] M.D.   On: 11/28/2020 20:46   CT HEAD WO CONTRAST (5MM)  Result Date: 11/28/2020 CLINICAL DATA:  Fall EXAM: CT HEAD WITHOUT CONTRAST CT CERVICAL SPINE WITHOUT CONTRAST TECHNIQUE: Multidetector CT imaging of the head and cervical spine was performed following the standard protocol without intravenous contrast. Multiplanar CT image reconstructions of the cervical spine were also generated. COMPARISON:  CT head and cervical spine 04/10/2019 FINDINGS: CT HEAD FINDINGS Brain: Moderate generalized atrophy. Mild white matter hypodensity bilaterally. Negative for acute infarct, hemorrhage, or mass Vascular: Negative for hyperdense vessel Skull: Negative Sinuses/Orbits: Mild mucosal edema paranasal sinuses. Left mastoid effusion. Right mastoid clear. Bilateral cataract extraction Other: None CT CERVICAL SPINE FINDINGS Alignment: Normal Skull base and vertebrae: Negative for fracture in the cervical spine. Soft tissues and spinal canal: Multiple thyroid nodules, difficult to measure on unenhanced CT. Right thyroid nodule approximately 15 mL. Cystic nodule in the isthmus measuring 17 mm. Atherosclerotic calcification of the carotid artery bilaterally. Disc  levels: Advanced degenerative changes C1-2 with prominent pannus posterior to the dens. Moderate left foraminal narrowing C3-4 due to spurring. Moderate foraminal narrowing bilaterally at C4-5 due to spurring with mild spinal stenosis Extensive spurring at C5-6 with moderate spinal stenosis and severe foraminal encroachment bilaterally. Probable cord  compression. No change from the prior study. Moderate to severe spinal stenosis at C6-7 due to diffuse endplate spurring and probable calcified disc protrusion. No change from the prior CT. Moderate to severe foraminal encroachment bilaterally. Upper chest: Lung apices clear bilaterally Other: None IMPRESSION: 1. Atrophy and chronic microvascular ischemic change. No acute intracranial abnormality 2. Negative for cervical spine fracture 3. Advanced spondylosis. Moderate to severe spinal stenosis at C5-6 and C6-7. Multilevel foraminal encroachment bilaterally due to spurring. 4. Multiple thyroid nodules up to 17 mm diameter. (ref: J Am Coll Radiol. 2015 Feb;12(2): 143-50). Recommend thyroid ultrasound. Electronically Signed   By: Franchot Gallo M.D.   On: 11/28/2020 21:16   CT Cervical Spine Wo Contrast  Result Date: 11/28/2020 CLINICAL DATA:  Fall EXAM: CT HEAD WITHOUT CONTRAST CT CERVICAL SPINE WITHOUT CONTRAST TECHNIQUE: Multidetector CT imaging of the head and cervical spine was performed following the standard protocol without intravenous contrast. Multiplanar CT image reconstructions of the cervical spine were also generated. COMPARISON:  CT head and cervical spine 04/10/2019 FINDINGS: CT HEAD FINDINGS Brain: Moderate generalized atrophy. Mild white matter hypodensity bilaterally. Negative for acute infarct, hemorrhage, or mass Vascular: Negative for hyperdense vessel Skull: Negative Sinuses/Orbits: Mild mucosal edema paranasal sinuses. Left mastoid effusion. Right mastoid clear. Bilateral cataract extraction Other: None CT CERVICAL SPINE FINDINGS Alignment: Normal Skull base and vertebrae: Negative for fracture in the cervical spine. Soft tissues and spinal canal: Multiple thyroid nodules, difficult to measure on unenhanced CT. Right thyroid nodule approximately 15 mL. Cystic nodule in the isthmus measuring 17 mm. Atherosclerotic calcification of the carotid artery bilaterally. Disc levels: Advanced  degenerative changes C1-2 with prominent pannus posterior to the dens. Moderate left foraminal narrowing C3-4 due to spurring. Moderate foraminal narrowing bilaterally at C4-5 due to spurring with mild spinal stenosis Extensive spurring at C5-6 with moderate spinal stenosis and severe foraminal encroachment bilaterally. Probable cord compression. No change from the prior study. Moderate to severe spinal stenosis at C6-7 due to diffuse endplate spurring and probable calcified disc protrusion. No change from the prior CT. Moderate to severe foraminal encroachment bilaterally. Upper chest: Lung apices clear bilaterally Other: None IMPRESSION: 1. Atrophy and chronic microvascular ischemic change. No acute intracranial abnormality 2. Negative for cervical spine fracture 3. Advanced spondylosis. Moderate to severe spinal stenosis at C5-6 and C6-7. Multilevel foraminal encroachment bilaterally due to spurring. 4. Multiple thyroid nodules up to 17 mm diameter. (ref: J Am Coll Radiol. 2015 Feb;12(2): 143-50). Recommend thyroid ultrasound. Electronically Signed   By: Franchot Gallo M.D.   On: 11/28/2020 21:16   DG Hip Unilat W or Wo Pelvis 2-3 Views Right  Result Date: 11/28/2020 CLINICAL DATA:  Fall, right hip pain EXAM: DG HIP (WITH OR WITHOUT PELVIS) 2-3V RIGHT COMPARISON:  None. FINDINGS: Single view radiograph pelvis and two view radiograph right hip demonstrates an acute, displaced right subcapital femoral neck fracture withl roughly 1.5 cm superior displacement and external rotation of the distal fracture fragment. The right femoral head is still seated within the right acetabulum. Mild superimposed right hip degenerative arthritis. Right femoral ORIF has been performed with a partially visualized lateral plate and multiple cortical screws along the proximal right femoral diaphysis. Left hip ORIF has  been performed. Pelvis appears intact. Degenerative changes are seen within the lumbar spine. Vascular  calcifications are noted within the medial thighs and pelvis. IMPRESSION: Acute, displaced right subcapital femoral neck fracture. Pre-existing hardware partially visualized involving the proximal right femoral diaphysis. Electronically Signed   By: [name] M.D.   On: 11/28/2020 20:08    Intake/Output    None      ROS No recent fever, bleeding abnormalities, urologic dysfunction, GI problems, or weight gain.  Blood pressure (!) 153/62, pulse 78, temperature 97.9 F (36.6 C), temperature source Oral, resp. rate 18, height 5\' 4"  (1.626 m), weight 65 kg, SpO2 96 %. Physical Exam NCAT, very pleasant RLE No traumatic wound or ecchymosis  Tender hip  No knee or ankle effusion  Reported healed wound over lateral malleolus from pressure but not rolled now because of hip fracture  Sens DPN, SPN, TN intact  Motor EHL, ext, flex, evers intact grossly  DP palp, No significant edema        Assessment/Plan:  Right displaced femoral neck fracture above periprosthetic plate   I discussed with the patient and her daughter the risks and benefits of surgery for right hip hemiarthroplasty and partial removal of hardware, including the possibility of infection, nerve injury, vessel injury, wound breakdown, arthritis, symptomatic hardware, DVT/ PE, loss of motion, malunion, nonunion, and need for further surgery among others.  She acknowledged these risks and wished to proceed.  Weightbearing: WBAT RLE Insicional and dressing care: OK to remove dressings after 48 hours  and leave open to air with dry gauze PRN Orthopedic device(s): None Showering: after 48 hours VTE prophylaxis: Lovenox 40mg  qd  daily; will transition to oral Pain control: Hydrocodone Follow - up plan: 2 weeks Contact information:  Altamese Landess MD, Ainsley Spinner PA   Altamese [place], MD [facility], [facility] [phone]  11/29/2020, 10:41 AM  [facility] [place]  [id number] [phone] Jenetta [phone] (F)    After 5pm and on the weekends please log on to Amion, go to orthopaedics and the look under the [facility] Call for the provider(s) on call. You can also call our office at [phone] and then follow the prompts to be connected to the call team.

## 2020-11-29 NOTE — Progress Notes (Signed)
[facility] PROGRESS NOTE    Progress Note  Megan Salinas  WPY:[id number] DOB: 01-08-1927 DOA: 11/28/2020 PCP: [name], MD     Brief Narrative:   Megan Salinas is an 87 y.o. female past medical history significant for CAD status post CABG in 0539, chronic diastolic heart failure, essential hypertension diabetes mellitus type 2 right femoral fracture shaft 15 to 20 years ago left hip fracture status post intramedullary nailing in March 2021 now presents to the ED with severe right hip pain after fall, right knee x-ray was negative for acute findings as well as CT of the head, x-ray of the right hip showed left subcapital femoral neck fracture involving the right femoral diaphysis.   Assessment/Plan:   Closed right hip fracture, initial encounter [facility] Orthopedic surgery at [facility] recommended transport to [facility]. Hold ACE inhibitor, aspirin and continue to narcotics for pain control. Will start on MiraLAX p.o. twice daily. Dr. Nance Pear, discussed the case with orthopedic trauma service Dr. Ginette Pitman at [facility] and recommended transfer to [facility], patient is currently NPO. Awaiting orthopedic surgery further recommendations.  CAD: Denies any exertional shortness of breath or chest pain. Hold aspirin for surgical procedure.  Chronic diastolic heart failure: With a last EF in March 20 21 to 50%, holding diuretic ACE inhibitor for surgical intervention. She appears euvolemic.  Diabetes mellitus type 2: With an A1c of 6.8. Diet controlled continue sliding scale insulin she is currently NPO.  Essential hypertension: Blood pressure seems to be able goal, hold ACE inhibitor.  Right ankle ulcer: Follow with by vascular surgery as an outpatient. No acute ischemia, does not appear to be infected. ABI done 11/27/2020 results are pending.  Incidental thyroid nodule: Noted follow-up as an outpatient at her age risk and benefits were discussed.   DVT prophylaxis:  Lovenox Family Communication:none Status is: Inpatient  Remains inpatient appropriate because: Acute right hip fracture for surgical intervention.    Code Status:     Code Status Orders  (From admission, onward)           Start     Ordered   11/29/20 0006  Full code  Continuous        11/29/20 0007           Code Status History     Date Active Date Inactive Code Status Order ID Comments User Context   04/10/2019 2239 04/15/2019 0043 Full Code [id number]  [name], MD ED   02/28/2017 1612 03/01/2017 1748 Full Code [id number]  [name], MD Inpatient   01/28/2016 2215 01/29/2016 1912 Full Code [id number]  [name], MD Inpatient      Advance Directive Documentation    Flowsheet Row Most Recent Value  Type of Advance Directive Healthcare Power of [place], Living will  Pre-existing out of facility DNR order (yellow form or pink MOST form) --  "MOST" Form in Place? --         IV Access:   Peripheral IV   Procedures and diagnostic studies:   DG Chest 1 View  Result Date: 11/28/2020 CLINICAL DATA:  Fall, right hip fracture, medical clearance EXAM: CHEST  1 VIEW COMPARISON:  04/10/2019 FINDINGS: Lungs are clear. No pneumothorax or pleural effusion. Coronary artery bypass grafting has been performed. Cardiac size is mildly enlarged, unchanged. Pulmonary vascularity is normal. No acute bone abnormality. IMPRESSION: No active disease.  Stable cardiomegaly. Electronically Signed   By: [name] M.D.   On: 11/28/2020 20:10  DG Knee 2 Views Right  Result Date: 11/28/2020 CLINICAL DATA:  Right knee fracture, fall EXAM: RIGHT KNEE - 1-2 VIEW COMPARISON:  None. FINDINGS: Two view radiograph right knee demonstrates normal alignment. No acute fracture or dislocation. Healed distal femoral diaphyseal fracture status post ORIF in anatomic alignment. 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  Vascular calcifications noted within the posterior soft tissues. Surgical clips and soft tissue defect noted within the medial aspect of the visualized right foreleg. IMPRESSION: No acute fracture or dislocation. Electronically Signed   By: [name] M.D.   On: 11/28/2020 20:46   CT HEAD WO CONTRAST (5MM)  Result Date: 11/28/2020 CLINICAL DATA:  Fall EXAM: CT HEAD WITHOUT CONTRAST CT CERVICAL SPINE WITHOUT CONTRAST TECHNIQUE: Multidetector CT imaging of the head and cervical spine was performed following the standard protocol without intravenous contrast. Multiplanar CT image reconstructions of the cervical spine were also generated. COMPARISON:  CT head and cervical spine 04/10/2019 FINDINGS: CT HEAD FINDINGS Brain: Moderate generalized atrophy. Mild white matter hypodensity bilaterally. Negative for acute infarct, hemorrhage, or mass Vascular: Negative for hyperdense vessel Skull: Negative Sinuses/Orbits: Mild mucosal edema paranasal sinuses. Left mastoid effusion. Right mastoid clear. Bilateral cataract extraction Other: None CT CERVICAL SPINE FINDINGS Alignment: Normal Skull base and vertebrae: Negative for fracture in the cervical spine. Soft tissues and spinal canal: Multiple thyroid nodules, difficult to measure on unenhanced CT. Right thyroid nodule approximately 15 mL. Cystic nodule in the isthmus measuring 17 mm. Atherosclerotic calcification of the carotid artery bilaterally. Disc levels: Advanced degenerative changes C1-2 with prominent pannus posterior to the dens. Moderate left foraminal narrowing C3-4 due to spurring. Moderate foraminal narrowing bilaterally at C4-5 due to spurring with mild spinal stenosis Extensive spurring at C5-6 with moderate spinal stenosis and severe foraminal encroachment bilaterally. Probable cord compression. No change from the prior study. Moderate to severe spinal stenosis at C6-7 due to diffuse endplate spurring and probable calcified disc protrusion. No change  from the prior CT. Moderate to severe foraminal encroachment bilaterally. Upper chest: Lung apices clear bilaterally Other: None IMPRESSION: 1. Atrophy and chronic microvascular ischemic change. No acute intracranial abnormality 2. Negative for cervical spine fracture 3. Advanced spondylosis. Moderate to severe spinal stenosis at C5-6 and C6-7. Multilevel foraminal encroachment bilaterally due to spurring. 4. Multiple thyroid nodules up to 17 mm diameter. (ref: J Am Coll Radiol. 2015 Feb;12(2): 143-50). Recommend thyroid ultrasound. Electronically Signed   By: Franchot Gallo M.D.   On: 11/28/2020 21:16   CT Cervical Spine Wo Contrast  Result Date: 11/28/2020 CLINICAL DATA:  Fall EXAM: CT HEAD WITHOUT CONTRAST CT CERVICAL SPINE WITHOUT CONTRAST TECHNIQUE: Multidetector CT imaging of the head and cervical spine was performed following the standard protocol without intravenous contrast. Multiplanar CT image reconstructions of the cervical spine were also generated. COMPARISON:  CT head and cervical spine 04/10/2019 FINDINGS: CT HEAD FINDINGS Brain: Moderate generalized atrophy. Mild white matter hypodensity bilaterally. Negative for acute infarct, hemorrhage, or mass Vascular: Negative for hyperdense vessel Skull: Negative Sinuses/Orbits: Mild mucosal edema paranasal sinuses. Left mastoid effusion. Right mastoid clear. Bilateral cataract extraction Other: None CT CERVICAL SPINE FINDINGS Alignment: Normal Skull base and vertebrae: Negative for fracture in the cervical spine. Soft tissues and spinal canal: Multiple thyroid nodules, difficult to measure on unenhanced CT. Right thyroid nodule approximately 15 mL. Cystic nodule in the isthmus measuring 17 mm. Atherosclerotic calcification of the carotid artery bilaterally. Disc levels: Advanced degenerative changes C1-2 with  prominent pannus posterior to the dens. Moderate left foraminal narrowing C3-4 due to spurring. Moderate foraminal narrowing bilaterally at C4-5  due to spurring with mild spinal stenosis Extensive spurring at C5-6 with moderate spinal stenosis and severe foraminal encroachment bilaterally. Probable cord compression. No change from the prior study. Moderate to severe spinal stenosis at C6-7 due to diffuse endplate spurring and probable calcified disc protrusion. No change from the prior CT. Moderate to severe foraminal encroachment bilaterally. Upper chest: Lung apices clear bilaterally Other: None IMPRESSION: 1. Atrophy and chronic microvascular ischemic change. No acute intracranial abnormality 2. Negative for cervical spine fracture 3. Advanced spondylosis. Moderate to severe spinal stenosis at C5-6 and C6-7. Multilevel foraminal encroachment bilaterally due to spurring. 4. Multiple thyroid nodules up to 17 mm diameter. (ref: J Am Coll Radiol. 2015 Feb;12(2): 143-50). Recommend thyroid ultrasound. Electronically Signed   By: Franchot Gallo M.D.   On: 11/28/2020 21:16   DG Hip Unilat W or Wo Pelvis 2-3 Views Right  Result Date: 11/28/2020 CLINICAL DATA:  Fall, right hip pain EXAM: DG HIP (WITH OR WITHOUT PELVIS) 2-3V RIGHT COMPARISON:  None. FINDINGS: Single view radiograph pelvis and two view radiograph right hip demonstrates an acute, displaced right subcapital femoral neck fracture withl roughly 1.5 cm superior displacement and external rotation of the distal fracture fragment. The right femoral head is still seated within the right acetabulum. Mild superimposed right hip degenerative arthritis. Right femoral ORIF has been performed with a partially visualized lateral plate and multiple cortical screws along the proximal right femoral diaphysis. Left hip ORIF has been performed. Pelvis appears intact. Degenerative changes are seen within the lumbar spine. Vascular calcifications are noted within the medial thighs and pelvis. IMPRESSION: Acute, displaced right subcapital femoral neck fracture. Pre-existing hardware partially visualized involving the  proximal right femoral diaphysis. Electronically Signed   By: [name] M.D.   On: 11/28/2020 20:08     Medical Consultants:   None.   Subjective:    Chauncey Mann relates pain not controlled  Objective:    Vitals:   11/28/20 2302 11/28/20 2310 11/29/20 0000 11/29/20 0120  BP: (!) 131/56  (!) 130/56 (!) 124/53  Pulse: 80  88 87  Resp: 16  (!) 25 20  Temp: 98.1 F (36.7 C)  98.2 F (36.8 C) 98.2 F (36.8 C)  TempSrc: Oral  Oral Oral  SpO2: 100%  98% 93%  Weight:  65 kg    Height:  5\' 4"  (1.626 m)     SpO2: 93 %  No intake or output data in the 24 hours ending 11/29/20 0805 Filed Weights   11/28/20 2310  Weight: 65 kg    Exam: General exam: In no acute distress. Respiratory system: Good air movement and clear to auscultation. Cardiovascular system: S1 & S2 heard, RRR. No JVD. Gastrointestinal system: Abdomen is nondistended, soft and nontender.  Extremities: No pedal edema. Skin: No rashes, lesions or ulcers Psychiatry: Judgement and insight appear normal. Mood & affect appropriate.    Data Reviewed:    Labs: Basic Metabolic Panel: Recent Labs  Lab 11/28/20 2013 11/29/20 0255  NA 136 133*  K 3.9 4.0  CL 106 103  CO2 23 21*  GLUCOSE 137* 165*  BUN 13 11  CREATININE 0.54 0.57  CALCIUM 9.0 9.0   GFR Estimated Creatinine Clearance: 38.7 mL/min (by C-G formula based on SCr of 0.57 mg/dL). Liver Function Tests: Recent Labs  Lab 11/28/20 2013  AST 31  ALT 26  ALKPHOS  80  BILITOT 1.6*  PROT 6.9  ALBUMIN 3.9   No results for input(s): LIPASE, AMYLASE in the last 168 hours. No results for input(s): AMMONIA in the last 168 hours. Coagulation profile No results for input(s): INR, PROTIME in the last 168 hours. COVID-19 Labs  No results for input(s): DDIMER, FERRITIN, LDH, CRP in the last 72 hours.  Lab Results  Component Value Date   SARSCOV2NAA NEGATIVE 11/28/2020   SARSCOV2NAA NEGATIVE 04/13/2019   [place] NEGATIVE 04/10/2019     CBC: Recent Labs  Lab 11/28/20 2013 11/29/20 0255  WBC 9.3 8.9  NEUTROABS 7.2  --   HGB 11.4* 10.5*  HCT 34.2* 32.1*  MCV 98.6 97.3  PLT 213 206   Cardiac Enzymes: No results for input(s): CKTOTAL, CKMB, CKMBINDEX, TROPONINI in the last 168 hours. BNP (last 3 results) No results for input(s): PROBNP in the last 8760 hours. CBG: Recent Labs  Lab 11/29/20 0411  GLUCAP 160*   D-Dimer: No results for input(s): DDIMER in the last 72 hours. Hgb A1c: No results for input(s): HGBA1C in the last 72 hours. Lipid Profile: No results for input(s): CHOL, HDL, LDLCALC, TRIG, CHOLHDL, LDLDIRECT in the last 72 hours. Thyroid function studies: No results for input(s): TSH, T4TOTAL, T3FREE, THYROIDAB in the last 72 hours.  Invalid input(s): FREET3 Anemia work up: No results for input(s): VITAMINB12, FOLATE, FERRITIN, TIBC, IRON, RETICCTPCT in the last 72 hours. Sepsis Labs: Recent Labs  Lab 11/28/20 2013 11/29/20 0255  WBC 9.3 8.9   Microbiology Recent Results (from the past 240 hour(s))  Resp Panel by RT-PCR (Flu A&B, Covid) Nasopharyngeal Swab     Status: None   Collection Time: 11/28/20  8:13 PM   Specimen: Nasopharyngeal Swab; Nasopharyngeal(NP) swabs in vial transport medium  Result Value Ref Range Status   SARS Coronavirus 2 by RT PCR NEGATIVE NEGATIVE Final    Comment: (NOTE) SARS-CoV-2 target nucleic acids are NOT DETECTED.  The SARS-CoV-2 RNA is generally detectable in upper respiratory specimens during the acute phase of infection. The lowest concentration of SARS-CoV-2 viral copies this assay can detect is 138 copies/mL. A negative result does not preclude SARS-Cov-2 infection and should not be used as the sole basis for treatment or other patient management decisions. A negative result may occur with  improper specimen collection/handling, submission of specimen other than nasopharyngeal swab, presence of viral mutation(s) within the areas targeted by this  assay, and inadequate number of viral copies(<138 copies/mL). A negative result must be combined with clinical observations, patient history, and epidemiological information. The expected result is Negative.  Fact Sheet for Patients:  EntrepreneurPulse.com.au  Fact Sheet for Healthcare Providers:  IncredibleEmployment.be  This test is no t yet approved or cleared by the [place] FDA and  has been authorized for detection and/or diagnosis of SARS-CoV-2 by FDA under an Emergency Use Authorization (EUA). This EUA will remain  in effect (meaning this test can be used) for the duration of the COVID-19 declaration under Section 564(b)(1) of the Act, 21 U.S.C.section 360bbb-3(b)(1), unless the authorization is terminated  or revoked sooner.       Influenza A by PCR NEGATIVE NEGATIVE Final   Influenza B by PCR NEGATIVE NEGATIVE Final    Comment: (NOTE) The Xpert Xpress SARS-CoV-2/FLU/RSV plus assay is intended as an aid in the diagnosis of influenza from Nasopharyngeal swab specimens and should not be used as a sole basis for treatment. Nasal washings and aspirates are unacceptable for Xpert Xpress SARS-CoV-2/FLU/RSV testing.  Fact  Sheet for Patients: EntrepreneurPulse.com.au  Fact Sheet for Healthcare Providers: IncredibleEmployment.be  This test is not yet approved or cleared by the [place] FDA and has been authorized for detection and/or diagnosis of SARS-CoV-2 by FDA under an Emergency Use Authorization (EUA). This EUA will remain in effect (meaning this test can be used) for the duration of the COVID-19 declaration under Section 564(b)(1) of the Act, 21 U.S.C. section 360bbb-3(b)(1), unless the authorization is terminated or revoked.  Performed at [facility], [address] [place], [place] [address]   MRSA Next Gen by PCR, Nasal     Status: Abnormal   Collection Time: 11/29/20   2:37 AM   Specimen: Nasal Mucosa; Nasal Swab  Result Value Ref Range Status   MRSA by PCR Next Gen POSITIVE (A) NOT DETECTED Final    Comment: RESULT CALLED TO, READ BACK BY AND VERIFIED WITH: RN DAJA 11/29/20@4 :20 BY TW Performed at [facility] [address], [place], [place] [address]      Medications:    famotidine  20 mg Oral Daily   insulin aspart  0-9 Units Subcutaneous Q4H   pantoprazole  40 mg Oral Daily   Continuous Infusions:  methocarbamol (ROBAXIN) IV        LOS: 1 day   Charlynne Cousins  [facility]  11/29/2020, 8:05 AM

## 2020-11-29 NOTE — H&P (Signed)
History and Physical    SHAKIERA EDELSON GBT:[id number] DOB: Jan 01, 1927 DOA: 11/28/2020  PCP: [name], MD   Patient coming from: ILF   Chief Complaint: Fall with right hip pain   HPI: KATRINNA TRAVIESO is a pleasant 87 y.o. female with medical history significant for CAD status post CABG in 7106, chronic diastolic CHF, hypertension, diet-controlled diabetes mellitus, right femoral shaft fracture 15 to 20 years ago, and left hip fracture status post cephalomedullary nailing in March 2021, now presenting to the ED with severe right hip pain after a fall.  Patient reports that she had been frustrated today by problems with grocery delivery, had set her Rollator aside while looking outside to see if the groceries were delivered, tripped, and fell onto her right side, experiencing immediate and severe pain at the right hip.  A security guard helped her back to her feet, but she fell again, EMS was called, and she was transported to the ED.  She denies any recent illness, fevers, or chills.  She is typically active and independent around her residence without chest pain or shortness of breath.  She reports history of some chest discomfort that is relieved with antacids and has been attributed to indigestion.  [facility] ED Course: Upon arrival to the ED, patient is found to be afebrile, saturating well on room air, and with stable blood pressure.  EKG features sinus rhythm with nonspecific repolarization abnormality.  Chemistry panel and CBC unremarkable.  Chest x-ray with stable cardiomegaly but no acute findings.  Plain films demonstrate acute displaced right subcapital femoral neck fracture.  Radiographs of the right knee are negative for acute finding.  No acute intracranial abnormality noted on head CT.  Cervical spine CT notable for advanced spondylosis and multiple thyroid nodules.  Orthopedic surgery at [facility] discussed the case with surgeon at [facility], patient was given analgesics, and transported to [facility]  ED for admission to the hospitalist service and evaluation by orthopedic surgery here.  Review of Systems:  All other systems reviewed and apart from HPI, are negative.  Past Medical History:  Diagnosis Date   Back pain    CAD (coronary artery disease)    CHF (congestive heart failure) (HCC)    GERD (gastroesophageal reflux disease)    High cholesterol    Hypertension    TIA (transient ischemic attack) 01/2016   "possible", no deficits   Wears hearing aid    bilateral    Past Surgical History:  Procedure Laterality Date   ABDOMINAL HYSTERECTOMY  1998   APPENDECTOMY     baldder tac  1995   BREAST CYST ASPIRATION Right    CARPAL TUNNEL RELEASE Right 03/26/2016   Procedure: CARPAL TUNNEL RELEASE ENDOSCOPIC  right;  Surgeon: Corky Mull, MD;  Location: [place];  Service: Orthopedics;  Laterality: Right;  bier block   CHOLECYSTECTOMY     CORONARY ARTERY BYPASS GRAFT  02/15/2010   [facility], 3 vessel   INTRAMEDULLARY (IM) NAIL INTERTROCHANTERIC Left 04/11/2019   Procedure: INTRAMEDULLARY (IM) NAIL INTERTROCHANTRIC;  Surgeon: Leim Fabry, MD;  Location: [facility] ORS;  Service: Orthopedics;  Laterality: Left;   TONSILLECTOMY      Social History:   reports that she has quit smoking. She has never used smokeless tobacco. She reports current alcohol use of about 5.0 standard drinks per week. She reports that she does not use drugs.  Allergies  Allergen Reactions   Dust Mite Extract Other (See Comments)    Runny nose  Sulfa Antibiotics Itching and Rash    Red, itchy palms    Family History  Problem Relation Age of Onset   Breast cancer Paternal Aunt        19's   Hypertension Mother      Prior to Admission medications   Medication Sig Start Date End Date Taking? Authorizing Provider  acetaminophen (TYLENOL) 500 MG tablet Take 500 mg by mouth every 6 (six) hours as needed for mild pain.     [provider]  amitriptyline (ELAVIL) 25 MG tablet Take 25 mg by  mouth at bedtime.  09/12/15   [provider]  aspirin EC 81 MG tablet Take 81 mg by mouth daily.     [provider]  betamethasone dipropionate (DIPROLENE) 0.05 % ointment APPLY TO AFFECTED AREAS ON VULVA TWO TIMES WEEKLY 03/30/19   [provider]  cetirizine (ZYRTEC) 10 MG tablet Take 10 mg by mouth daily. 03/29/19   [provider]  docusate sodium (COLACE) 100 MG capsule Take 1 capsule (100 mg total) by mouth 2 (two) times daily. 04/13/19   Dhungel, Nishant, MD  enoxaparin (LOVENOX) 40 MG/0.4ML injection Inject 0.4 mLs (40 mg total) into the skin daily for 14 days. 04/12/19 04/26/19  Reche Dixon, PA-C  Ensure Max Protein (ENSURE MAX PROTEIN) LIQD Take 330 mLs (11 oz total) by mouth 2 (two) times daily between meals. 04/13/19   Dhungel, Flonnie Overman, MD  famotidine (PEPCID) 20 MG tablet Take 20 mg by mouth 2 (two) times daily.     [provider]  HYDROcodone-acetaminophen (NORCO/VICODIN) 5-325 MG tablet Take 1 tablet by mouth every 4 (four) hours as needed for moderate pain. 04/13/19   Dhungel, Flonnie Overman, MD  ibuprofen (ADVIL,MOTRIN) 200 MG tablet Take 200 mg by mouth every 6 (six) hours as needed for moderate pain.     [provider]  lisinopril (PRINIVIL,ZESTRIL) 20 MG tablet Take 20 mg by mouth daily.  05/19/16   [provider]  magnesium hydroxide (MILK OF MAGNESIA) 400 MG/5ML suspension Take 30 mLs by mouth daily as needed for mild constipation. 04/13/19   Dhungel, Flonnie Overman, MD  methocarbamol (ROBAXIN) 500 MG tablet Take 1 tablet (500 mg total) by mouth every 8 (eight) hours as needed for muscle spasms. 04/13/19   Dhungel, Flonnie Overman, MD  metoprolol tartrate (LOPRESSOR) 25 MG tablet TAKE 1/2 TABLET BY MOUTH TWICE DAILY 06/26/15   [provider]  Multiple Vitamin (MULTI-VITAMINS) TABS Take 1 tablet by mouth daily.     [provider]  nystatin cream (MYCOSTATIN) Apply one application to affected area as needed. 09/10/15   [provider]  oxyCODONE (OXY IR/ROXICODONE) 5 MG immediate release tablet Take 0.5-1 tablets (2.5-5 mg total) by mouth every 4 (four) hours as needed for moderate pain or severe pain (pain score 4-6). 04/12/19   Reche Dixon, PA-C  pantoprazole (PROTONIX) 40 MG tablet Take 1 tablet (40 mg total) by mouth daily. 04/13/19   Dhungel, Nishant, MD  polyethylene glycol (MIRALAX / GLYCOLAX) packet Take 17 g by mouth daily.     [provider]  senna-docusate (SENOKOT-S) 8.6-50 MG tablet Take 1 tablet by mouth at bedtime as needed for mild constipation. 04/13/19   Dhungel, Flonnie Overman, MD  simvastatin (ZOCOR) 40 MG tablet TAKE 1 TABLET BY MOUTH EVERY DAY 05/28/15   [provider]  spironolactone (ALDACTONE) 25 MG tablet TAKE 1/2 TABLET BY MOUTH EVERY DAY 09/06/15   [provider]    Physical Exam: Vitals:  11/28/20 2300 11/28/20 2302 11/28/20 2310 11/29/20 0000  BP: (!) 131/56 (!) 131/56  (!) 130/56  Pulse: 82 80  88  Resp:  16  (!) 25  Temp:  98.1 F (36.7 C)  98.2 F (36.8 C)  TempSrc:  Oral  Oral  SpO2: (!) 88% 100%  98%  Weight:   65 kg   Height:   5\' 4"  (1.626 m)     Constitutional: NAD, calm  Eyes: PERTLA, lids and conjunctivae normal ENMT: Mucous membranes are moist. Posterior pharynx clear of any exudate or lesions.   Neck: supple, no masses  Respiratory: no wheezing, no crackles. No accessory muscle use.  Cardiovascular: S1 & S2 heard, regular rate and rhythm. No extremity edema.   Abdomen: No distension, no tenderness, soft. Bowel sounds active.  Musculoskeletal: no clubbing / cyanosis. Right hip tenderness and deformity, neurovascularly intact distally.   Skin: no significant rashes. Warm, dry, well-perfused. Neurologic: CN 2-12 grossly intact. Moving all extremitie92s. Alert and oriented.  Psychiatric: Pleasant. Cooperative.    Labs and Imaging on Admission: I have personally reviewed following labs and imaging studies  CBC: Recent Labs  Lab  11/28/20 2013  WBC 9.3  NEUTROABS 7.2  HGB 11.4*  HCT 34.2*  MCV 98.6  PLT 616   Basic Metabolic Panel: Recent Labs  Lab 11/28/20 2013  NA 136  K 3.9  CL 106  CO2 23  GLUCOSE 137*  BUN 13  CREATININE 0.54  CALCIUM 9.0   GFR: Estimated Creatinine Clearance: 38.7 mL/min (by C-G formula based on SCr of 0.54 mg/dL). Liver Function Tests: Recent Labs  Lab 11/28/20 2013  AST 31  ALT 26  ALKPHOS 80  BILITOT 1.6*  PROT 6.9  ALBUMIN 3.9   No results for input(s): LIPASE, AMYLASE in the last 168 hours. No results for input(s): AMMONIA in the last 168 hours. Coagulation Profile: No results for input(s): INR, PROTIME in the last 168 hours. Cardiac Enzymes: No results for input(s): CKTOTAL, CKMB, CKMBINDEX, TROPONINI in the last 168 hours. BNP (last 3 results) No results for input(s): PROBNP in the last 8760 hours. HbA1C: No results for input(s): HGBA1C in the last 72 hours. CBG: No results for input(s): GLUCAP in the last 168 hours. Lipid Profile: No results for input(s): CHOL, HDL, LDLCALC, TRIG, CHOLHDL, LDLDIRECT in the last 72 hours. Thyroid Function Tests: No results for input(s): TSH, T4TOTAL, FREET4, T3FREE, THYROIDAB in the last 72 hours. Anemia Panel: No results for input(s): VITAMINB12, FOLATE, FERRITIN, TIBC, IRON, RETICCTPCT in the last 72 hours. Urine analysis:    Component Value Date/Time   COLORURINE YELLOW (A) 05/06/2019 1155   APPEARANCEUR CLEAR (A) 05/06/2019 1155   APPEARANCEUR Clear 09/19/2013 1329   LABSPEC 1.013 05/06/2019 1155   LABSPEC 1.014 09/19/2013 1329   PHURINE 6.0 05/06/2019 1155   GLUCOSEU NEGATIVE 05/06/2019 1155   GLUCOSEU Negative 09/19/2013 1329   HGBUR NEGATIVE 05/06/2019 1155   BILIRUBINUR NEGATIVE 05/06/2019 1155   BILIRUBINUR Negative 09/19/2013 1329   KETONESUR NEGATIVE 05/06/2019 1155   PROTEINUR NEGATIVE 05/06/2019 1155   NITRITE NEGATIVE 05/06/2019 1155   LEUKOCYTESUR NEGATIVE 05/06/2019 1155   LEUKOCYTESUR  Negative 09/19/2013 1329   Sepsis Labs: @LABRCNTIP (procalcitonin:4,lacticidven:4) ) Recent Results (from the past 240 hour(s))  Resp Panel by RT-PCR (Flu A&B, Covid) Nasopharyngeal Swab     Status: None   Collection Time: 11/28/20  8:13 PM   Specimen: Nasopharyngeal Swab; Nasopharyngeal(NP) swabs in vial transport medium  Result Value Ref Range Status   SARS  Coronavirus 2 by RT PCR NEGATIVE NEGATIVE Final    Comment: (NOTE) SARS-CoV-2 target nucleic acids are NOT DETECTED.  The SARS-CoV-2 RNA is generally detectable in upper respiratory specimens during the acute phase of infection. The lowest concentration of SARS-CoV-2 viral copies this assay can detect is 138 copies/mL. A negative result does not preclude SARS-Cov-2 infection and should not be used as the sole basis for treatment or other patient management decisions. A negative result may occur with  improper specimen collection/handling, submission of specimen other than nasopharyngeal swab, presence of viral mutation(s) within the areas targeted by this assay, and inadequate number of viral copies(<138 copies/mL). A negative result must be combined with clinical observations, patient history, and epidemiological information. The expected result is Negative.  Fact Sheet for Patients:  EntrepreneurPulse.com.au  Fact Sheet for Healthcare Providers:  IncredibleEmployment.be  This test is no t yet approved or cleared by the [place] FDA and  has been authorized for detection and/or diagnosis of SARS-CoV-2 by FDA under an Emergency Use Authorization (EUA). This EUA will remain  in effect (meaning this test can be used) for the duration of the COVID-19 declaration under Section 564(b)(1) of the Act, 21 U.S.C.section 360bbb-3(b)(1), unless the authorization is terminated  or revoked sooner.       Influenza A by PCR NEGATIVE NEGATIVE Final   Influenza B by PCR NEGATIVE NEGATIVE Final     Comment: (NOTE) The Xpert Xpress SARS-CoV-2/FLU/RSV plus assay is intended as an aid in the diagnosis of influenza from Nasopharyngeal swab specimens and should not be used as a sole basis for treatment. Nasal washings and aspirates are unacceptable for Xpert Xpress SARS-CoV-2/FLU/RSV testing.  Fact Sheet for Patients: EntrepreneurPulse.com.au  Fact Sheet for Healthcare Providers: IncredibleEmployment.be  This test is not yet approved or cleared by the [place] FDA and has been authorized for detection and/or diagnosis of SARS-CoV-2 by FDA under an Emergency Use Authorization (EUA). This EUA will remain in effect (meaning this test can be used) for the duration of the COVID-19 declaration under Section 564(b)(1) of the Act, 21 U.S.C. section 360bbb-3(b)(1), unless the authorization is terminated or revoked.  Performed at [facility], [address], [place], [place] [address]      Radiological Exams on Admission: DG Chest 1 View  Result Date: 11/28/2020 CLINICAL DATA:  Fall, right hip fracture, medical clearance EXAM: CHEST  1 VIEW COMPARISON:  04/10/2019 FINDINGS: Lungs are clear. No pneumothorax or pleural effusion. Coronary artery bypass grafting has been performed. Cardiac size is mildly enlarged, unchanged. Pulmonary vascularity is normal. No acute bone abnormality. IMPRESSION: No active disease.  Stable cardiomegaly. Electronically Signed   By: [name] M.D.   On: 11/28/2020 20:10   DG Knee 2 Views Right  Result Date: 11/28/2020 CLINICAL DATA:  Right knee fracture, fall EXAM: RIGHT KNEE - 1-2 VIEW COMPARISON:  None. FINDINGS: Two view radiograph right knee demonstrates normal alignment. No acute fracture or dislocation. Healed distal femoral diaphyseal fracture status post ORIF in anatomic alignment. 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 Vascular  calcifications noted within the posterior soft tissues. Surgical clips and soft tissue defect noted within the medial aspect of the visualized right foreleg. IMPRESSION: No acute fracture or dislocation. Electronically Signed   By: [name] M.D.   On: 11/28/2020 20:46   CT HEAD WO CONTRAST (5MM)  Result Date: 11/28/2020 CLINICAL DATA:  Fall EXAM: CT HEAD WITHOUT CONTRAST CT CERVICAL SPINE  WITHOUT CONTRAST TECHNIQUE: Multidetector CT imaging of the head and cervical spine was performed following the standard protocol without intravenous contrast. Multiplanar CT image reconstructions of the cervical spine were also generated. COMPARISON:  CT head and cervical spine 04/10/2019 FINDINGS: CT HEAD FINDINGS Brain: Moderate generalized atrophy. Mild white matter hypodensity bilaterally. Negative for acute infarct, hemorrhage, or mass Vascular: Negative for hyperdense vessel Skull: Negative Sinuses/Orbits: Mild mucosal edema paranasal sinuses. Left mastoid effusion. Right mastoid clear. Bilateral cataract extraction Other: None CT CERVICAL SPINE FINDINGS Alignment: Normal Skull base and vertebrae: Negative for fracture in the cervical spine. Soft tissues and spinal canal: Multiple thyroid nodules, difficult to measure on unenhanced CT. Right thyroid nodule approximately 15 mL. Cystic nodule in the isthmus measuring 17 mm. Atherosclerotic calcification of the carotid artery bilaterally. Disc levels: Advanced degenerative changes C1-2 with prominent pannus posterior to the dens. Moderate left foraminal narrowing C3-4 due to spurring. Moderate foraminal narrowing bilaterally at C4-5 due to spurring with mild spinal stenosis Extensive spurring at C5-6 with moderate spinal stenosis and severe foraminal encroachment bilaterally. Probable cord compression. No change from the prior study. Moderate to severe spinal stenosis at C6-7 due to diffuse endplate spurring and probable calcified disc protrusion. No change from the  prior CT. Moderate to severe foraminal encroachment bilaterally. Upper chest: Lung apices clear bilaterally Other: None IMPRESSION: 1. Atrophy and chronic microvascular ischemic change. No acute intracranial abnormality 2. Negative for cervical spine fracture 3. Advanced spondylosis. Moderate to severe spinal stenosis at C5-6 and C6-7. Multilevel foraminal encroachment bilaterally due to spurring. 4. Multiple thyroid nodules up to 17 mm diameter. (ref: J Am Coll Radiol. 2015 Feb;12(2): 143-50). Recommend thyroid ultrasound. Electronically Signed   By: Franchot Gallo M.D.   On: 11/28/2020 21:16   CT Cervical Spine Wo Contrast  Result Date: 11/28/2020 CLINICAL DATA:  Fall EXAM: CT HEAD WITHOUT CONTRAST CT CERVICAL SPINE WITHOUT CONTRAST TECHNIQUE: Multidetector CT imaging of the head and cervical spine was performed following the standard protocol without intravenous contrast. Multiplanar CT image reconstructions of the cervical spine were also generated. COMPARISON:  CT head and cervical spine 04/10/2019 FINDINGS: CT HEAD FINDINGS Brain: Moderate generalized atrophy. Mild white matter hypodensity bilaterally. Negative for acute infarct, hemorrhage, or mass Vascular: Negative for hyperdense vessel Skull: Negative Sinuses/Orbits: Mild mucosal edema paranasal sinuses. Left mastoid effusion. Right mastoid clear. Bilateral cataract extraction Other: None CT CERVICAL SPINE FINDINGS Alignment: Normal Skull base and vertebrae: Negative for fracture in the cervical spine. Soft tissues and spinal canal: Multiple thyroid nodules, difficult to measure on unenhanced CT. Right thyroid nodule approximately 15 mL. Cystic nodule in the isthmus measuring 17 mm. Atherosclerotic calcification of the carotid artery bilaterally. Disc levels: Advanced degenerative changes C1-2 with prominent pannus posterior to the dens. Moderate left foraminal narrowing C3-4 due to spurring. Moderate foraminal narrowing bilaterally at C4-5 due to  spurring with mild spinal stenosis Extensive spurring at C5-6 with moderate spinal stenosis and severe foraminal encroachment bilaterally. Probable cord compression. No change from the prior study. Moderate to severe spinal stenosis at C6-7 due to diffuse endplate spurring and probable calcified disc protrusion. No change from the prior CT. Moderate to severe foraminal encroachment bilaterally. Upper chest: Lung apices clear bilaterally Other: None IMPRESSION: 1. Atrophy and chronic microvascular ischemic change. No acute intracranial abnormality 2. Negative for cervical spine fracture 3. Advanced spondylosis. Moderate to severe spinal stenosis at C5-6 and C6-7. Multilevel foraminal encroachment bilaterally due to spurring. 4. Multiple thyroid nodules up to 17 mm diameter. (  ref: J Am Coll Radiol. 2015 Feb;12(2): 143-50). Recommend thyroid ultrasound. Electronically Signed   By: Franchot Gallo M.D.   On: 11/28/2020 21:16   DG Hip Unilat W or Wo Pelvis 2-3 Views Right  Result Date: 11/28/2020 CLINICAL DATA:  Fall, right hip pain EXAM: DG HIP (WITH OR WITHOUT PELVIS) 2-3V RIGHT COMPARISON:  None. FINDINGS: Single view radiograph pelvis and two view radiograph right hip demonstrates an acute, displaced right subcapital femoral neck fracture withl roughly 1.5 cm superior displacement and external rotation of the distal fracture fragment. The right femoral head is still seated within the right acetabulum. Mild superimposed right hip degenerative arthritis. Right femoral ORIF has been performed with a partially visualized lateral plate and multiple cortical screws along the proximal right femoral diaphysis. Left hip ORIF has been performed. Pelvis appears intact. Degenerative changes are seen within the lumbar spine. Vascular calcifications are noted within the medial thighs and pelvis. IMPRESSION: Acute, displaced right subcapital femoral neck fracture. Pre-existing hardware partially visualized involving the  proximal right femoral diaphysis. Electronically Signed   By: [name] M.D.   On: 11/28/2020 20:08    EKG: Independently reviewed. Sinus rhythm, non-specific repol abnormality.   Assessment/Plan   1. Right hip fracture  - Presents with acute right hip pain and inability to bear wt after a fall and found to have acute displaced right subcapital hip fracture  - Orthopedic surgery is consulting and much appreciated  - Based on the available data, Mrs. Kuhar presents an estimated 1.5% risk of perioperative MI or cardiac arrest  - Hold ACE-i and ASA, continue pain-control, CMS checks, supportive care    2. CAD  - Pt reports long hx of occasional non-exertional CP that has been attributed to GERD and relieved by antacids, tolerates physical activities around her residence without chest pressure or SOB  - Hold ASA preoperatively   3. Chronic diastolic CHF  - Appears compensated  - EF was 50-55% in March 2021  - Hold diuretics while NPO, hold ACE-i perioperatively, monitor volume status   4. Type II DM  - A1c was 6.8% in March 2021  - Has been diet-controlled  - Monitor CBGs and use low-intensity SSI if needed    5. Hypertension  - BP at goal  - Hold ACE perioperatively    6. Right ankle ulcer  - Following with vascular surgery for small lateral right ankle ulcer  - ABIs done 11/27/20, results pending  - No acute ischemia or infection, continue wound care    7. Thyroid nodules  - Multiple thyroid nodules noted incidentally on CT in ED  - Outpatient follow-up with ultrasound recommended     DVT prophylaxis: SCDs  Code Status: Full  Level of Care: Level of care: Med-Surg Family Communication: Daughter updated at bedside  Disposition Plan:  Patient is from: ILF  Anticipated d/c is to: SNF  Anticipated d/c date is: 12/02/20  Patient currently: Pending orthopedic consultation, likely operative hip repair  Consults called: Orthopedic surgery  Admission status: Inpatient      Vianne Bulls, MD [facility]  11/29/2020, 12:30 AM

## 2020-11-29 NOTE — Transfer of Care (Signed)
Immediate Anesthesia Transfer of Care Note  Patient: Megan Salinas  Procedure(s) Performed: ARTHROPLASTY BIPOLAR HIP (HEMIARTHROPLASTY) REMOVAL OF HARDWARE RIGHT FEMUR (Right: Hip)  Patient Location: PACU  Anesthesia Type:Spinal  Level of Consciousness: drowsy and patient cooperative  Airway & Oxygen Therapy: Patient Spontanous Breathing and Patient connected to nasal cannula oxygen  Post-op Assessment: Report given to RN and Post -op Vital signs reviewed and stable  Post vital signs: Reviewed and stable  Last Vitals:  Vitals Value Taken Time  BP    Temp    Pulse 70 11/29/20 1424  Resp 12 11/29/20 1424  SpO2 100 % 11/29/20 1424  Vitals shown include unvalidated device data.  Last Pain:  Vitals:   11/29/20 1040  TempSrc:   PainSc: 2          Complications: No notable events documented.

## 2020-11-29 NOTE — Anesthesia Procedure Notes (Signed)
Spinal  Patient location during procedure: OR Start time: 11/29/2020 11:30 AM End time: 11/29/2020 11:36 AM Reason for block: surgical anesthesia Staffing Performed: anesthesiologist  Anesthesiologist: Freddrick March, MD Preanesthetic Checklist Completed: patient identified, IV checked, risks and benefits discussed, surgical consent, monitors and equipment checked, pre-op evaluation and timeout performed Spinal Block Patient position: right lateral decubitus Prep: DuraPrep and site prepped and draped Patient monitoring: cardiac monitor, continuous pulse ox and blood pressure Approach: midline Location: L3-4 Injection technique: single-shot Needle Needle type: Quincke  Needle gauge: 22 G Needle length: 9 cm Assessment Sensory level: T6 Events: CSF return Additional Notes Functioning IV was confirmed and monitors were applied. Sterile prep and drape, including hand hygiene and sterile gloves were used. The patient was positioned and the spine was prepped. The skin was anesthetized with lidocaine.  Free flow of clear CSF was obtained prior to injecting local anesthetic into the CSF.  The spinal needle aspirated freely following injection.  The needle was carefully withdrawn.  The patient tolerated the procedure well.

## 2020-11-29 NOTE — Progress Notes (Signed)
Initial Nutrition Assessment  DOCUMENTATION CODES:   Not applicable  INTERVENTION:  - Once diet resumes, recommend Ensure Enlive po BID, each supplement provides 350 kcal and 20 grams of protein  NUTRITION DIAGNOSIS:   Increased nutrient needs related to hip fracture as evidenced by estimated needs.  GOAL:   Patient will meet greater than or equal to 90% of their needs  MONITOR:   PO intake, Supplement acceptance, Labs, Weight trends  REASON FOR ASSESSMENT:   Consult Assessment of nutrition requirement/status (femur/hip fracture)  ASSESSMENT:   Pt from [facility] admitted with R hip fracture after a fall. PMH includes CAD s/p CABG, CHF, HTN, GERD, T2DM, R femoral shaft fracture, L hip fracture s/p cephalomedullary nailing in March 2021.  Pt remains NPO for R hip hemiarthroplasty and partial removal of hardware. Unable to obtain nutrition/weight history as pt is in surgery. Will assess further at follow up.  Medications: colace, pepcid, SSI, protonix, miralax  Labs: Sodium 133 CBG's: 135-160 x4 hours  Diet Order:   Diet Order             Diet NPO time specified Except for: Ice Chips, Sips with Meds  Diet effective now                   EDUCATION NEEDS:   Not appropriate for education at this time  Skin:  Skin Assessment: Reviewed RN Assessment  Last BM:  11/28/20  Height:   Ht Readings from Last 1 Encounters:  11/28/20 5\' 4"  (1.626 m)    Weight:   Wt Readings from Last 1 Encounters:  11/28/20 65 kg    BMI:  Body mass index is 24.6 kg/m.  Estimated Nutritional Needs:   Kcal:  1400-1600  Protein:  80-90g  Fluid:  >1.5L  Megan Salinas, RDN, LDN Clinical Nutrition

## 2020-11-30 ENCOUNTER — Encounter (HOSPITAL_COMMUNITY): Payer: Self-pay | Admitting: Orthopedic Surgery

## 2020-11-30 LAB — CBC
HCT: 27.9 % — ABNORMAL LOW (ref 36.0–46.0)
Hemoglobin: 9.2 g/dL — ABNORMAL LOW (ref 12.0–15.0)
MCH: 32.3 pg (ref 26.0–34.0)
MCHC: 33 g/dL (ref 30.0–36.0)
MCV: 97.9 fL (ref 80.0–100.0)
Platelets: 172 10*3/uL (ref 150–400)
RBC: 2.85 MIL/uL — ABNORMAL LOW (ref 3.87–5.11)
RDW: 14.9 % (ref 11.5–15.5)
WBC: 8.3 10*3/uL (ref 4.0–10.5)
nRBC: 0 % (ref 0.0–0.2)

## 2020-11-30 LAB — BASIC METABOLIC PANEL
Anion gap: 8 (ref 5–15)
BUN: 11 mg/dL (ref 8–23)
CO2: 19 mmol/L — ABNORMAL LOW (ref 22–32)
Calcium: 8.6 mg/dL — ABNORMAL LOW (ref 8.9–10.3)
Chloride: 104 mmol/L (ref 98–111)
Creatinine, Ser: 0.7 mg/dL (ref 0.44–1.00)
GFR, Estimated: >60 mL/min (ref >60)
Glucose, Bld: 143 mg/dL — ABNORMAL HIGH (ref 70–99)
Potassium: 3.7 mmol/L (ref 3.5–5.1)
Sodium: 131 mmol/L — ABNORMAL LOW (ref 135–145)

## 2020-11-30 LAB — GLUCOSE, CAPILLARY
Glucose-Capillary: 117 mg/dL — ABNORMAL HIGH (ref 70–99)
Glucose-Capillary: 139 mg/dL — ABNORMAL HIGH (ref 70–99)
Glucose-Capillary: 195 mg/dL — ABNORMAL HIGH (ref 70–99)
Glucose-Capillary: 201 mg/dL — ABNORMAL HIGH (ref 70–99)
Glucose-Capillary: 210 mg/dL — ABNORMAL HIGH (ref 70–99)
Glucose-Capillary: 222 mg/dL — ABNORMAL HIGH (ref 70–99)

## 2020-11-30 LAB — VITAMIN D 25 HYDROXY (VIT D DEFICIENCY, FRACTURES): Vit D, 25-Hydroxy: 23.45 ng/mL — ABNORMAL LOW (ref 30–100)

## 2020-11-30 MED ORDER — ENSURE ENLIVE PO LIQD
237.0000 mL | Freq: Two times a day (BID) | ORAL | Status: DC
  Administered 2020-11-30 – 2020-12-03 (×8): 237 mL via ORAL

## 2020-11-30 MED ORDER — SORBITOL 70 % SOLN
30.0000 mL | Freq: Two times a day (BID) | Status: AC
  Administered 2020-11-30 (×2): 30 mL via ORAL
  Filled 2020-11-30 (×2): qty 30

## 2020-11-30 MED ORDER — DOCUSATE SODIUM 100 MG PO CAPS: 100.0000 mg | ORAL_CAPSULE | Freq: Two times a day (BID) | ORAL | Status: DC

## 2020-11-30 MED ORDER — POLYETHYLENE GLYCOL 3350 17 G PO PACK
17.0000 g | PACK | Freq: Two times a day (BID) | ORAL | Status: DC
  Administered 2020-11-30 (×2): 17 g via ORAL
  Filled 2020-11-30 (×2): qty 1

## 2020-11-30 MED ORDER — PSYLLIUM 95 % PO PACK
2.0000 | PACK | Freq: Every day | ORAL | Status: DC
  Administered 2020-11-30 – 2020-12-03 (×4): 2 via ORAL
  Filled 2020-11-30 (×3): qty 2

## 2020-11-30 MED ORDER — ASPIRIN EC 81 MG PO TBEC
81.0000 mg | DELAYED_RELEASE_TABLET | Freq: Every day | ORAL | Status: DC
  Administered 2020-12-01 – 2020-12-03 (×3): 81 mg via ORAL
  Filled 2020-11-30 (×3): qty 1

## 2020-11-30 MED ORDER — MELATONIN 3 MG PO TABS
3.0000 mg | ORAL_TABLET | Freq: Every day | ORAL | Status: DC
  Administered 2020-11-30 – 2020-12-02 (×3): 3 mg via ORAL
  Filled 2020-11-30 (×3): qty 1

## 2020-11-30 NOTE — Evaluation (Signed)
Clinical/Bedside Swallow Evaluation Patient Details  Name: Megan Salinas MRN: [id number] Date of Birth: 14-Feb-1926  Today's Date: 11/30/2020 Time: SLP Start Time (ACUTE ONLY): 2202 SLP Stop Time (ACUTE ONLY): 5427 SLP Time Calculation (min) (ACUTE ONLY): 17 min  Past Medical History:  Past Medical History:  Diagnosis Date   Back pain    CAD (coronary artery disease)    CHF (congestive heart failure) (HCC)    GERD (gastroesophageal reflux disease)    High cholesterol    Hypertension    TIA (transient ischemic attack) 01/2016   "possible", no deficits   Wears hearing aid    bilateral   Past Surgical History:  Past Surgical History:  Procedure Laterality Date   ABDOMINAL HYSTERECTOMY  1998   APPENDECTOMY     baldder tac  1995   BREAST CYST ASPIRATION Right    CARPAL TUNNEL RELEASE Right 03/26/2016   Procedure: CARPAL TUNNEL RELEASE ENDOSCOPIC  right;  Surgeon: Corky Mull, MD;  Location: [facility];  Service: Orthopedics;  Laterality: Right;  bier block   CHOLECYSTECTOMY     CORONARY ARTERY BYPASS GRAFT  02/15/2010   [facility], 3 vessel   INTRAMEDULLARY (IM) NAIL INTERTROCHANTERIC Left 04/11/2019   Procedure: INTRAMEDULLARY (IM) NAIL INTERTROCHANTRIC;  Surgeon: Leim Fabry, MD;  Location: [facility];  Service: Orthopedics;  Laterality: Left;   TONSILLECTOMY     HPI:  Megan Salinas is an 87 y.o. female past medical history significant for CAD, s/p CABG, GERD, chronic diastolic heart failure, essential hypertension, diabetes mellitus type 2, right femoral fracture shaft now presents to the ED with severe right hip pain after fall, right knee x-ray was negative,, x-ray of the right hip showed left subcapital femoral neck fracture involving the right femoral diaphysis. Head CT No acute intracranial abnormality    Assessment / Plan / Recommendation  Clinical Impression  Pt seen for swallow assessment with daughter at bedside. Pt has history of GERD causingabrupt expectoration  episode several months ago at a restaurant. Pt reports significant pharyngeal globus sensation and takes medication. Her oral-motor, vocal quality and cough response are adequate and strong. She did exhibit immediate throat clearing with multiple swallows resembling reflux episode with pudding. No coughing or suspicion of aspiration with thin or puree. Therapist reviewed and reiterated strategies for GERD to minimize symptoms. Pt performing most of these prior to admission. Recommend regular texture/thin liquids. No further ST needed, Pt is followed closely by GI. SLP Visit Diagnosis: Dysphagia, unspecified (R13.10)    Aspiration Risk  Mild aspiration risk    Diet Recommendation Regular;Thin liquid   Liquid Administration via: Cup;Straw Medication Administration: Whole meds with liquid Supervision: Patient able to self feed Compensations: Slow rate;Small sips/bites Postural Changes: Seated upright at 90 degrees;Remain upright for at least 30 minutes after po intake    Other  Recommendations Oral Care Recommendations: Oral care BID    Recommendations for follow up therapy are one component of a multi-disciplinary discharge planning process, led by the attending physician.  Recommendations may be updated based on patient status, additional functional criteria and insurance authorization.  Follow up Recommendations No SLP follow up      Assistance Recommended at Discharge    Functional Status Assessment    Frequency and Duration            Prognosis        Swallow Study   General Date of Onset: 11/29/20 HPI: Megan Salinas is an 87 y.o. female past medical history significant  for CAD, s/p CABG, GERD, chronic diastolic heart failure, essential hypertension, diabetes mellitus type 2, right femoral fracture shaft now presents to the ED with severe right hip pain after fall, right knee x-ray was negative,, x-ray of the right hip showed left subcapital femoral neck fracture involving the  right femoral diaphysis. Head CT No acute intracranial abnormality Type of Study: Bedside Swallow Evaluation Previous Swallow Assessment:  (none) Diet Prior to this Study: Regular;Thin liquids Temperature Spikes Noted: No Respiratory Status: Room air History of Recent Intubation: No Behavior/Cognition: Alert;Pleasant mood;Cooperative Oral Cavity Assessment: Within Functional Limits Oral Care Completed by SLP: No Oral Cavity - Dentition: Dentures, top;Dentures, bottom Vision: Functional for self-feeding Self-Feeding Abilities: Able to feed self Patient Positioning: Upright in bed Baseline Vocal Quality: Normal Volitional Cough: Strong Volitional Swallow: Able to elicit    Oral/Motor/Sensory Function Overall Oral Motor/Sensory Function: Within functional limits   Ice Chips Ice chips: Not tested   Thin Liquid Thin Liquid: Within functional limits Presentation: Cup;Straw    Nectar Thick Nectar Thick Liquid: Not tested   Honey Thick Honey Thick Liquid: Not tested   Puree Puree: Impaired Pharyngeal Phase Impairments: Throat Clearing - Immediate   Solid     Solid: Within functional limits      Houston Siren 11/30/2020,1:33 PM

## 2020-11-30 NOTE — Plan of Care (Signed)
Pt transferred to room 5N02.  Daughter Lattie Haw at bedside.

## 2020-11-30 NOTE — Evaluation (Signed)
Occupational Therapy Evaluation Patient Details Name: Megan Salinas MRN: [id number] DOB: Feb 29, 1926 Today's Date: 11/30/2020   History of Present Illness 87 y.o. female admitted s/p fall with R femoral neck fracture . Underwent R hemiarthroplasty posterior) 11/17. Past medical history significant for CAD status post CABG in 7262, chronic diastolic heart failure, essential hypertension diabetes mellitus type 2 right femoral fracture shaft 15 to 20 years ago left hip fracture status post intramedullary nailing in March 2021.   Clinical Impression   PTA pt lives in [place] using a rollator for mobility and ADL. Currently requires Max A +2 for mobility and up to total A for LB ADL due to below listed deficits. Pt will benefit from skilled OT services at SNF to maximize functional level of independence with mobility and ADL to reach goal of returning to [place] if possible. Will follow acutely.       Recommendations for follow up therapy are one component of a multi-disciplinary discharge planning process, led by the attending physician.  Recommendations may be updated based on patient status, additional functional criteria and insurance authorization.   Follow Up Recommendations  Skilled nursing-short term rehab (<3 hours/day)    Assistance Recommended at Discharge Frequent or constant Supervision/Assistance  Functional Status Assessment  Patient has had a recent decline in their functional status and demonstrates the ability to make significant improvements in function in a reasonable and predictable amount of time.  Equipment Recommendations  None recommended by OT    Recommendations for Other Services       Precautions / Restrictions Precautions Precautions: Fall;Posterior Hip Required Braces or Orthoses:  (abductor wedge when in bed) Restrictions Weight Bearing Restrictions: Yes RLE Weight Bearing: Weight bearing as tolerated       Mobility Bed Mobility Overal bed mobility: Needs Assistance Bed Mobility: Supine to Sit     Supine to sit: Max assist;+2 for safety/equipment          Transfers Overall transfer level: Needs assistance   Transfers: Sit to/from Stand Sit to Stand: +2 physical assistance;Mod assist Stand pivot Max A +2                  Balance Overall balance assessment: History of Falls;Needs assistance Sitting-balance support: Feet supported Sitting balance-Leahy Scale: Poor       Standing balance-Leahy Scale: Zero Standing balance comment: significant posterior lean                           ADL either performed or assessed with clinical judgement   ADL Overall ADL's : Needs assistance/impaired     Grooming: Set up   Upper Body Bathing: Set up   Lower Body Bathing: Maximal assistance;Sit to/from stand   Upper Body Dressing : Set up;Supervision/safety;Sitting   Lower Body Dressing: Total assistance   Toilet Transfer: Maximal assistance;+2 for physical assistance;Stand-pivot;Rolling walker (2 wheels)   Toileting- Clothing Manipulation and Hygiene: Maximal assistance       Functional mobility during ADLs: Maximal assistance;+2 for physical assistance;Rolling walker (2 wheels);Cueing for safety;Cueing for sequencing       Vision Baseline Vision/History: 6 Macular Degeneration       Perception     Praxis      Pertinent Vitals/Pain Pain Assessment: Faces Faces Pain Scale: Hurts little more Pain Location: R hip; L ankle Pain Descriptors / Indicators: Aching;Discomfort;Grimacing Pain Intervention(s): Limited activity within patient's tolerance;Repositioned     Hand Dominance Right  Extremity/Trunk Assessment Upper Extremity Assessment Upper Extremity Assessment: Generalized weakness (L shoulder deficits at baseline)   Lower Extremity Assessment Lower Extremity Assessment: Defer to PT evaluation   Cervical / Trunk Assessment Cervical /  Trunk Assessment: Kyphotic   Communication Communication Communication: HOH (very)   Cognition Arousal/Alertness: Awake/alert Behavior During Therapy: WFL for tasks assessed/performed Overall Cognitive Status: Within Functional Limits for tasks assessed                                       General Comments  new complaint of pain on posterior aspect of L feel    Exercises Exercises: Other exercises Other Exercises Other Exercises: encouraged incentive spirometer   Shoulder Instructions      Home Living Family/patient expects to be discharged to:: Skilled nursing facility                                        Prior Functioning/Environment Prior Level of Function : Independent/Modified Independent;History of Falls (last six months)               ADLs Comments: Was living in [place] in [place]; used a rollator        OT Problem List: Decreased strength;Decreased range of motion;Decreased activity tolerance;Impaired balance (sitting and/or standing);Impaired vision/perception;Decreased safety awareness;Decreased knowledge of use of DME or AE;Decreased knowledge of precautions;Pain;Impaired UE functional use      OT Treatment/Interventions: Self-care/ADL training;Therapeutic exercise;DME and/or AE instruction;Therapeutic activities;Patient/family education;Balance training    OT Goals(Current goals can be found in the care plan section) Acute Rehab OT Goals Patient Stated Goal: to go to rehab closer to daughter in [place] Goal Formulation: With patient/family Time For Goal Achievement: 12/14/20 Potential to Achieve Goals: Good  OT Frequency: Min 2X/week   Barriers to D/C:            Co-evaluation PT/OT/SLP Co-Evaluation/Treatment: Yes Reason for Co-Treatment: For patient/therapist safety;To address functional/ADL transfers   OT goals addressed during session: ADL's and self-care       AM-PAC OT "6 Clicks" Daily Activity     Outcome Measure Help from another person eating meals?: None Help from another person taking care of personal grooming?: A Little Help from another person toileting, which includes using toliet, bedpan, or urinal?: A Lot Help from another person bathing (including washing, rinsing, drying)?: A Lot Help from another person to put on and taking off regular upper body clothing?: A Little Help from another person to put on and taking off regular lower body clothing?: Total 6 Click Score: 15   End of Session Equipment Utilized During Treatment: Gait belt;Rolling walker (2 wheels) Nurse Communication: Mobility status;Precautions;Weight bearing status;Need for lift equipment  Activity Tolerance: Patient tolerated treatment well Patient left: in chair;with call bell/phone within reach;with chair alarm set;with family/visitor present  OT Visit Diagnosis: Unsteadiness on feet (R26.81);Other abnormalities of gait and mobility (R26.89);Muscle weakness (generalized) (M62.81);History of falling (Z91.81);Pain Pain - Right/Left: Right Pain - part of body: Hip;Ankle and joints of foot                Time: 9563-8756 OT Time Calculation (min): 32 min Charges:  OT General Charges $OT Visit: 1 Visit OT Evaluation $OT Eval Moderate Complexity: [place], OT/L   Acute OT Clinical Specialist  Acute Rehabilitation Services Pager [phone] Office [phone]   [facility] 11/30/2020, 1:08 PM

## 2020-11-30 NOTE — Evaluation (Signed)
Physical Therapy Evaluation Patient Details Name: LYRIQUE HAKIM MRN: [id number] DOB: 1927/01/01 Today's Date: 11/30/2020  History of Present Illness  87 y.o. female admitted s/p fall with R femoral neck fracture . Underwent R hemiarthroplasty posterior) 11/17. Past medical history significant for CAD status post CABG in 3614, chronic diastolic heart failure, essential hypertension diabetes mellitus type 2 right femoral fracture shaft 15 to 20 years ago left hip fracture status post intramedullary nailing in March 2021.  Clinical Impression  Patient admitted with above diagnosis. Patient presents with weakness, impaired balance, decreased activity tolerance, and pain. Patient reports pain in R hip and L heel. Patient requires maxA+2 for bed mobility and modA+2 for sit to stand and step pivot transfer with use of RW. Patient was living in [place] using rollator. Patient will benefit from skilled PT services during acute stay to address listed deficits. Recommend SNF at discharge to maximize functional independence and safety.        Recommendations for follow up therapy are one component of a multi-disciplinary discharge planning process, led by the attending physician.  Recommendations may be updated based on patient status, additional functional criteria and insurance authorization.  Follow Up Recommendations Skilled nursing-short term rehab (<3 hours/day)    Assistance Recommended at Discharge Frequent or constant Supervision/Assistance  Functional Status Assessment Patient has had a recent decline in their functional status and/or demonstrates limited ability to make significant improvements in function in a reasonable and predictable amount of time  Equipment Recommendations  Rolling Joie Hipps (2 wheels)    Recommendations for Other Services       Precautions / Restrictions Precautions Precautions: Fall;Posterior Hip Required Braces or Orthoses:  (abductor wedge when in  bed) Restrictions Weight Bearing Restrictions: Yes RLE Weight Bearing: Weight bearing as tolerated      Mobility  Bed Mobility Overal bed mobility: Needs Assistance Bed Mobility: Supine to Sit     Supine to sit: Max assist;+2 for safety/equipment          Transfers Overall transfer level: Needs assistance   Transfers: Sit to/from Stand;Bed to chair/wheelchair/BSC Sit to Stand: +2 physical assistance;Mod assist   Step pivot transfers: Min assist;Mod assist;+2 physical assistance       General transfer comment: modA+2 for balance and RW management    Ambulation/Gait                  Stairs            Wheelchair Mobility    Modified Rankin (Stroke Patients Only)       Balance Overall balance assessment: History of Falls;Needs assistance Sitting-balance support: Feet supported Sitting balance-Leahy Scale: Poor     Standing balance support: Bilateral upper extremity supported Standing balance-Leahy Scale: Zero Standing balance comment: significant posterior lean                             Pertinent Vitals/Pain Pain Assessment: Faces Faces Pain Scale: Hurts little more Pain Location: R hip; L ankle Pain Descriptors / Indicators: Aching;Discomfort;Grimacing Pain Intervention(s): Limited activity within patient's tolerance;Monitored during session    Home Living Family/patient expects to be discharged to:: Skilled nursing facility                        Prior Function Prior Level of Function : Independent/Modified Independent;History of Falls (last six months)               ADLs  Comments: Was living in [place] in [place]; used a Film/video editor Dominance   Dominant Hand: Right    Extremity/Trunk Assessment   Upper Extremity Assessment Upper Extremity Assessment: Defer to OT evaluation    Lower Extremity Assessment Lower Extremity Assessment: Generalized weakness;RLE  deficits/detail RLE Deficits / Details: unable to lift against gravity but able to advance during mobility RLE: Unable to fully assess due to pain    Cervical / Trunk Assessment Cervical / Trunk Assessment: Kyphotic  Communication   Communication: HOH (very)  Cognition Arousal/Alertness: Awake/alert Behavior During Therapy: WFL for tasks assessed/performed Overall Cognitive Status: Within Functional Limits for tasks assessed                                          General Comments General comments (skin integrity, edema, etc.): new complaint of pain on posterior aspect of L feel    Exercises Other Exercises Other Exercises: encouraged incentive spirometer   Assessment/Plan    PT Assessment Patient needs continued PT services  PT Problem List Decreased strength;Decreased mobility;Decreased balance;Decreased activity tolerance;Decreased knowledge of use of DME;Decreased knowledge of precautions       PT Treatment Interventions DME instruction;Gait training;Functional mobility training;Therapeutic activities;Therapeutic exercise;Balance training;Patient/family education    PT Goals (Current goals can be found in the Care Plan section)  Acute Rehab PT Goals Patient Stated Goal: to go to rehab PT Goal Formulation: With patient/family Time For Goal Achievement: 12/14/20 Potential to Achieve Goals: Fair    Frequency Min 3X/week   Barriers to discharge        Co-evaluation PT/OT/SLP Co-Evaluation/Treatment: Yes Reason for Co-Treatment: For patient/therapist safety;To address functional/ADL transfers PT goals addressed during session: Mobility/safety with mobility;Balance;Proper use of DME OT goals addressed during session: ADL's and self-care       AM-PAC PT "6 Clicks" Mobility  Outcome Measure Help needed turning from your back to your side while in a flat bed without using bedrails?: Total Help needed moving from lying on your back to sitting on the  side of a flat bed without using bedrails?: Total Help needed moving to and from a bed to a chair (including a wheelchair)?: Total Help needed standing up from a chair using your arms (e.g., wheelchair or bedside chair)?: Total Help needed to walk in hospital room?: Total Help needed climbing 3-5 steps with a railing? : Total 6 Click Score: 6    End of Session Equipment Utilized During Treatment: Gait belt Activity Tolerance: Patient tolerated treatment well Patient left: in chair;with call bell/phone within reach;with chair alarm set;with family/visitor present Nurse Communication: Mobility status PT Visit Diagnosis: Unsteadiness on feet (R26.81);Muscle weakness (generalized) (M62.81);History of falling (Z91.81);Difficulty in walking, not elsewhere classified (R26.2)    Time: 6073-7106 PT Time Calculation (min) (ACUTE ONLY): 27 min   Charges:   PT Evaluation $PT Eval Moderate Complexity: 1 Mod PT Treatments $Therapeutic Activity: 8-22 mins        Cheney Ewart A. Gilford Rile PT, DPT Acute Rehabilitation Services Pager [phone] Office [phone]   Linna Hoff 11/30/2020, 1:28 PM

## 2020-11-30 NOTE — Progress Notes (Signed)
Orthopaedic Trauma Service Progress Note  Patient ID: Megan Salinas MRN: [id number] DOB/AGE: 08-10-1926 86 y.o.  Subjective:  Doing well Has been sitting up in chair about 30 min R hip sore but tolerable  No other complaints   ROS As above  Objective:   VITALS:   Vitals:   11/29/20 2300 11/30/20 0330 11/30/20 0735 11/30/20 1129  BP: (!) 104/53  (!) 110/50 (!) 102/47  Pulse: 97  100 (!) 102  Resp: 20  18 18   Temp: 98.7 F (37.1 C) 99.9 F (37.7 C) 99.2 F (37.3 C) 98.4 F (36.9 C)  TempSrc: Oral Axillary Oral   SpO2: 93%  90% 90%  Weight:      Height:        Estimated body mass index is 24.6 kg/m as calculated from the following:   Height as of this encounter: 5\' 4"  (1.626 m).   Weight as of this encounter: 65 kg.   Intake/Output      11/17 0701 11/18 0700 11/18 0701 11/19 0700   P.O.  240   I.V. (mL/kg) 1522.5 (23.4)    IV Piggyback 300    Total Intake(mL/kg) 1822.5 (28) 240 (3.7)   Urine (mL/kg/hr) 600 (0.4)    Blood 200    Total Output 800    Net +1022.5 +240        Urine Occurrence  1 x     LABS  Results for orders placed or performed during the hospital encounter of 11/28/20 (from the past 24 hour(s))  Glucose, capillary     Status: None   Collection Time: 11/29/20  3:51 PM  Result Value Ref Range   Glucose-Capillary 87 70 - 99 mg/dL  Glucose, capillary     Status: Abnormal   Collection Time: 11/29/20  8:18 PM  Result Value Ref Range   Glucose-Capillary 146 (H) 70 - 99 mg/dL  Glucose, capillary     Status: Abnormal   Collection Time: 11/29/20 11:02 PM  Result Value Ref Range   Glucose-Capillary 148 (H) 70 - 99 mg/dL  Glucose, capillary     Status: Abnormal   Collection Time: 11/30/20  3:31 AM  Result Value Ref Range   Glucose-Capillary 139 (H) 70 - 99 mg/dL  CBC     Status: Abnormal   Collection Time: 11/30/20  3:50 AM  Result Value Ref Range   WBC 8.3 4.0 -  10.5 K/uL   RBC 2.85 (L) 3.87 - 5.11 MIL/uL   Hemoglobin 9.2 (L) 12.0 - 15.0 g/dL   HCT 27.9 (L) 36.0 - 46.0 %   MCV 97.9 80.0 - 100.0 fL   MCH 32.3 26.0 - 34.0 pg   MCHC 33.0 30.0 - 36.0 g/dL   RDW 14.9 11.5 - 15.5 %   Platelets 172 150 - 400 K/uL   nRBC 0.0 0.0 - 0.2 %  Basic metabolic panel     Status: Abnormal   Collection Time: 11/30/20  3:50 AM  Result Value Ref Range   Sodium 131 (L) 135 - 145 mmol/L   Potassium 3.7 3.5 - 5.1 mmol/L   Chloride 104 98 - 111 mmol/L   CO2 19 (L) 22 - 32 mmol/L   Glucose, Bld 143 (H) 70 - 99 mg/dL   BUN 11 8 - 23 mg/dL   Creatinine, Ser 0.70  0.44 - 1.00 mg/dL   Calcium 8.6 (L) 8.9 - 10.3 mg/dL   GFR, Estimated >60 >60 mL/min   Anion gap 8 5 - 15  VITAMIN D 25 Hydroxy (Vit-D Deficiency, Fractures)     Status: Abnormal   Collection Time: 11/30/20  3:50 AM  Result Value Ref Range   Vit D, 25-Hydroxy 23.45 (L) 30 - 100 ng/mL  Glucose, capillary     Status: Abnormal   Collection Time: 11/30/20  7:36 AM  Result Value Ref Range   Glucose-Capillary 117 (H) 70 - 99 mg/dL  Glucose, capillary     Status: Abnormal   Collection Time: 11/30/20 11:26 AM  Result Value Ref Range   Glucose-Capillary 201 (H) 70 - 99 mg/dL     PHYSICAL EXAM:   Gen: sitting up in chair reading on tablet, pleasant  Lungs: unlabored Ext:       Right Lower Extremity   Dressing R hip clean, dry and intact  Ext warm   Minimal swelling  + PT pulse  No DCT  Distal motor and sensory functions intact      Assessment/Plan: 1 Day Post-Op   Principal Problem:   Closed right hip fracture, initial encounter ([place]) Active Problems:   Benign essential hypertension   Chronic diastolic CHF (congestive heart failure) (HCC)   Coronary artery disease   Type 2 diabetes mellitus without complication (HCC)   Lower limb ulcer, ankle, right, limited to breakdown of skin ([place])   Thyroid incidentaloma   Anti-infectives (From admission, onward)    Start     Dose/Rate Route  Frequency Ordered Stop   11/29/20 1800  ceFAZolin (ANCEF) IVPB 2g/100 mL premix        2 g 200 mL/hr over 30 Minutes Intravenous Every 6 hours 11/29/20 1651 11/29/20 2346   11/29/20 1045  ceFAZolin (ANCEF) IVPB 2g/100 mL premix        2 g 200 mL/hr over 30 Minutes Intravenous  Once 11/29/20 1038 11/29/20 1150     .  POD/HD#: 1  87 y/o female s/p fall with R femoral neck fracture   - fall  - R femoral neck fracture s/p R hip hemiarthroplasty   WBAT R leg  Posterior hip precautions  Dressing changes as needed starting on 12/02/2020  PT/OT  Ice prn     TOC consult for snf    Family wanting something in [place] as this is where pts family lives   - Pain management:  Multimodal   Minimze narcotics  - ABL anemia/Hemodynamics  Monitor   - Medical issues   Per primary   - DVT/PE prophylaxis:  Lovenox x 28 days   - ID:   Periop abx  - Metabolic Bone Disease:  Vitamin d insufficiency    Supplement   Fracture suggestive of osteoporosis   - Activity:  As above  - Impediments to fracture healing:  Vitamin d insufficiency   Osteoporosis  - Dispo:  Ortho issues stable  TOC consult for SNF    Jari Pigg, PA-C [phone] (C) 11/30/2020, 12:45 PM  [facility] [place] [id number] [phone] Jenetta [phone] (F)    After 5pm and on the weekends please log on to Amion, go to orthopaedics and the look under the [facility] Call for the provider(s) on call. You can also call our office at [phone] and then follow the prompts to be connected to the call team.

## 2020-11-30 NOTE — Progress Notes (Signed)
[facility] PROGRESS NOTE    Progress Note  SHER SHAMPINE  SNK:[id number] DOB: 03-02-1926 DOA: 11/28/2020 PCP: [name], MD     Brief Narrative:   SAHITI JOSWICK is an 87 y.o. female past medical history significant for CAD status post CABG in 9379, chronic diastolic heart failure, essential hypertension diabetes mellitus type 2 right femoral fracture shaft 15 to 20 years ago left hip fracture status post intramedullary nailing in March 2021 now presents to the ED with severe right hip pain after fall, right knee x-ray was negative for acute findings as well as CT of the head, x-ray of the right hip showed left subcapital femoral neck fracture involving the right femoral diaphysis.   Assessment/Plan:   Closed right hip fracture, initial encounter [facility]) Orthopedic surgery at [facility] recommended transport to [facility]. She is status post surgical intervention on 11/29/2020. Continue to narcotics for pain control. Will start on MiraLAX p.o. twice daily, Continue MiraLAX, she is not had a bowel movement at [place].  Anticoagulation and weightbearing per orthopedic surgery.   CAD: Denies any exertional shortness of breath or chest pain. Hold aspirin for surgical procedure.  Chronic diastolic heart failure: With a last EF in March 20 21 to 50%, holding diuretic ACE inhibitor for surgical intervention. She appears euvolemic.  Diabetes mellitus type 2: With an A1c of 6.8. Currently on sliding scale insulin blood glucose seems to be fairly  Essential hypertension: Blood pressure seems to be able goal, hold ACE inhibitor.  Right ankle ulcer: Follow with by vascular surgery as an outpatient. No acute ischemia, does not appear to be infected. ABI done on the 15th still pending.  Incidental thyroid nodule: Noted follow-up as an outpatient at her age risk and benefits were discussed.   DVT prophylaxis: Lovenox Family Communication:none Status is: Inpatient  Remains  inpatient appropriate because: Acute right hip fracture for surgical intervention.    Code Status:     Code Status Orders  (From admission, onward)           Start     Ordered   11/29/20 0006  Full code  Continuous        11/29/20 0007           Code Status History     Date Active Date Inactive Code Status Order ID Comments User Context   04/10/2019 2239 04/15/2019 0043 Full Code [id number]  [name], MD ED   02/28/2017 1612 03/01/2017 1748 Full Code [id number]  [name], MD Inpatient   01/28/2016 2215 01/29/2016 1912 Full Code [id number]  [name], MD Inpatient      Advance Directive Documentation    Flowsheet Row Most Recent Value  Type of Advance Directive Healthcare Power of [place], Living will  Pre-existing out of facility DNR order (yellow form or pink MOST form) --  "MOST" Form in Place? --         IV Access:   Peripheral IV   Procedures and diagnostic studies:   DG Chest 1 View  Result Date: 11/28/2020 CLINICAL DATA:  Fall, right hip fracture, medical clearance EXAM: CHEST  1 VIEW COMPARISON:  04/10/2019 FINDINGS: Lungs are clear. No pneumothorax or pleural effusion. Coronary artery bypass grafting has been performed. Cardiac size is mildly enlarged, unchanged. Pulmonary vascularity is normal. No acute bone abnormality. IMPRESSION: No active disease.  Stable cardiomegaly. Electronically Signed   By: [name] M.D.   On: 11/28/2020 20:10   DG Knee 2 Views Right  Result Date: 11/28/2020 CLINICAL DATA:  Right knee fracture, fall EXAM: RIGHT KNEE - 1-2 VIEW COMPARISON:  None. FINDINGS: Two view radiograph right knee demonstrates normal alignment. No acute fracture or dislocation. Healed distal femoral diaphyseal fracture status post ORIF in anatomic alignment. 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 Vascular calcifications noted within the posterior soft tissues. Surgical  clips and soft tissue defect noted within the medial aspect of the visualized right foreleg. IMPRESSION: No acute fracture or dislocation. Electronically Signed   By: [name] M.D.   On: 11/28/2020 20:46   CT HEAD WO CONTRAST (5MM)  Result Date: 11/28/2020 CLINICAL DATA:  Fall EXAM: CT HEAD WITHOUT CONTRAST CT CERVICAL SPINE WITHOUT CONTRAST TECHNIQUE: Multidetector CT imaging of the head and cervical spine was performed following the standard protocol without intravenous contrast. Multiplanar CT image reconstructions of the cervical spine were also generated. COMPARISON:  CT head and cervical spine 04/10/2019 FINDINGS: CT HEAD FINDINGS Brain: Moderate generalized atrophy. Mild white matter hypodensity bilaterally. Negative for acute infarct, hemorrhage, or mass Vascular: Negative for hyperdense vessel Skull: Negative Sinuses/Orbits: Mild mucosal edema paranasal sinuses. Left mastoid effusion. Right mastoid clear. Bilateral cataract extraction Other: None CT CERVICAL SPINE FINDINGS Alignment: Normal Skull base and vertebrae: Negative for fracture in the cervical spine. Soft tissues and spinal canal: Multiple thyroid nodules, difficult to measure on unenhanced CT. Right thyroid nodule approximately 15 mL. Cystic nodule in the isthmus measuring 17 mm. Atherosclerotic calcification of the carotid artery bilaterally. Disc levels: Advanced degenerative changes C1-2 with prominent pannus posterior to the dens. Moderate left foraminal narrowing C3-4 due to spurring. Moderate foraminal narrowing bilaterally at C4-5 due to spurring with mild spinal stenosis Extensive spurring at C5-6 with moderate spinal stenosis and severe foraminal encroachment bilaterally. Probable cord compression. No change from the prior study. Moderate to severe spinal stenosis at C6-7 due to diffuse endplate spurring and probable calcified disc protrusion. No change from the prior CT. Moderate to severe foraminal encroachment bilaterally.  Upper chest: Lung apices clear bilaterally Other: None IMPRESSION: 1. Atrophy and chronic microvascular ischemic change. No acute intracranial abnormality 2. Negative for cervical spine fracture 3. Advanced spondylosis. Moderate to severe spinal stenosis at C5-6 and C6-7. Multilevel foraminal encroachment bilaterally due to spurring. 4. Multiple thyroid nodules up to 17 mm diameter. (ref: J Am Coll Radiol. 2015 Feb;12(2): 143-50). Recommend thyroid ultrasound. Electronically Signed   By: Franchot Gallo M.D.   On: 11/28/2020 21:16   CT Cervical Spine Wo Contrast  Result Date: 11/28/2020 CLINICAL DATA:  Fall EXAM: CT HEAD WITHOUT CONTRAST CT CERVICAL SPINE WITHOUT CONTRAST TECHNIQUE: Multidetector CT imaging of the head and cervical spine was performed following the standard protocol without intravenous contrast. Multiplanar CT image reconstructions of the cervical spine were also generated. COMPARISON:  CT head and cervical spine 04/10/2019 FINDINGS: CT HEAD FINDINGS Brain: Moderate generalized atrophy. Mild white matter hypodensity bilaterally. Negative for acute infarct, hemorrhage, or mass Vascular: Negative for hyperdense vessel Skull: Negative Sinuses/Orbits: Mild mucosal edema paranasal sinuses. Left mastoid effusion. Right mastoid clear. Bilateral cataract extraction Other: None CT CERVICAL SPINE FINDINGS Alignment: Normal Skull base and vertebrae: Negative for fracture in the cervical spine. Soft tissues and spinal canal: Multiple thyroid nodules, difficult to measure on unenhanced CT. Right thyroid nodule approximately 15 mL. Cystic nodule in the isthmus measuring 17 mm. Atherosclerotic calcification of the carotid artery bilaterally. Disc levels: Advanced degenerative changes C1-2 with prominent pannus posterior to the dens.  Moderate left foraminal narrowing C3-4 due to spurring. Moderate foraminal narrowing bilaterally at C4-5 due to spurring with mild spinal stenosis Extensive spurring at C5-6 with  moderate spinal stenosis and severe foraminal encroachment bilaterally. Probable cord compression. No change from the prior study. Moderate to severe spinal stenosis at C6-7 due to diffuse endplate spurring and probable calcified disc protrusion. No change from the prior CT. Moderate to severe foraminal encroachment bilaterally. Upper chest: Lung apices clear bilaterally Other: None IMPRESSION: 1. Atrophy and chronic microvascular ischemic change. No acute intracranial abnormality 2. Negative for cervical spine fracture 3. Advanced spondylosis. Moderate to severe spinal stenosis at C5-6 and C6-7. Multilevel foraminal encroachment bilaterally due to spurring. 4. Multiple thyroid nodules up to 17 mm diameter. (ref: J Am Coll Radiol. 2015 Feb;12(2): 143-50). Recommend thyroid ultrasound. Electronically Signed   By: Franchot Gallo M.D.   On: 11/28/2020 21:16   DG C-Arm 1-60 Min-No Report  Result Date: 11/29/2020 CLINICAL DATA:  Hardware removal EXAM: RIGHT FEMUR 2 VIEWS; DG C-ARM 1-60 MIN-NO REPORT COMPARISON:  11/28/2020 FINDINGS: A single C-arm fluoroscopic image was obtained intraoperatively and submitted for post operative interpretation. Single image obtained after screw removal of multiple screws within the proximal aspect of the patient's right femoral fixation construct. 11 seconds fluoroscopy time was utilized. Please see the performing provider's procedural report for further detail. IMPRESSION: As above. Electronically Signed   By: Davina Poke D.O.   On: 11/29/2020 14:50   DG Hip Port Unilat With Pelvis 1V Right  Result Date: 11/29/2020 CLINICAL DATA:  Right hip arthroplasty EXAM: DG HIP (WITH OR WITHOUT PELVIS) 1V PORT RIGHT COMPARISON:  11/28/2020 FINDINGS: Interval postsurgical changes from right hip hemiarthroplasty. Arthroplasty components are in their expected alignment. No periprosthetic fracture or evidence of other complication. There is also been interval removal of the proximal  screws related to patient's prior femoral fixation construct. Expected postoperative changes within the overlying soft tissues. Advanced atherosclerotic calcifications. IMPRESSION: Interval postsurgical changes from right hip hemiarthroplasty. Electronically Signed   By: Davina Poke D.O.   On: 11/29/2020 14:48   DG Hip Unilat W or Wo Pelvis 2-3 Views Right  Result Date: 11/28/2020 CLINICAL DATA:  Fall, right hip pain EXAM: DG HIP (WITH OR WITHOUT PELVIS) 2-3V RIGHT COMPARISON:  None. FINDINGS: Single view radiograph pelvis and two view radiograph right hip demonstrates an acute, displaced right subcapital femoral neck fracture withl roughly 1.5 cm superior displacement and external rotation of the distal fracture fragment. The right femoral head is still seated within the right acetabulum. Mild superimposed right hip degenerative arthritis. Right femoral ORIF has been performed with a partially visualized lateral plate and multiple cortical screws along the proximal right femoral diaphysis. Left hip ORIF has been performed. Pelvis appears intact. Degenerative changes are seen within the lumbar spine. Vascular calcifications are noted within the medial thighs and pelvis. IMPRESSION: Acute, displaced right subcapital femoral neck fracture. Pre-existing hardware partially visualized involving the proximal right femoral diaphysis. Electronically Signed   By: [name] M.D.   On: 11/28/2020 20:08   DG FEMUR, MIN 2 VIEWS RIGHT  Result Date: 11/29/2020 CLINICAL DATA:  Hardware removal EXAM: RIGHT FEMUR 2 VIEWS; DG C-ARM 1-60 MIN-NO REPORT COMPARISON:  11/28/2020 FINDINGS: A single C-arm fluoroscopic image was obtained intraoperatively and submitted for post operative interpretation. Single image obtained after screw removal of multiple screws within the proximal aspect of the patient's right femoral fixation construct. 11 seconds fluoroscopy time was utilized. Please see the performing provider's  procedural report for further detail. IMPRESSION: As above. Electronically Signed   By: Davina Poke D.O.   On: 11/29/2020 14:50     Medical Consultants:   None.   Subjective:    Chauncey Mann relates her pain is controlled has not had a bowel movement.  Objective:    Vitals:   11/29/20 2130 11/29/20 2300 11/30/20 0330 11/30/20 0735  BP: (!) 95/41 (!) 104/53  (!) 110/50  Pulse:  97  100  Resp:  20    Temp:  98.7 F (37.1 C) 99.9 F (37.7 C) 99.2 F (37.3 C)  TempSrc:  Oral Axillary   SpO2:  93%  90%  Weight:      Height:       SpO2: 90 %   Intake/Output Summary (Last 24 hours) at 11/30/2020 0834 Last data filed at 11/30/2020 0334 Gross per 24 hour  Intake 1822.5 ml  Output 800 ml  Net 1022.5 ml   Filed Weights   11/28/20 2310  Weight: 65 kg    Exam: General exam: In no acute distress. Respiratory system: Good air movement and clear to auscultation. Cardiovascular system: S1 & S2 heard, RRR. No JVD. Gastrointestinal system: Abdomen is nondistended, soft and nontender.  Extremities: No pedal edema. Skin: No rashes, lesions or ulcers Psychiatry: Judgement and insight appear normal. Mood & affect appropriate.   Data Reviewed:    Labs: Basic Metabolic Panel: Recent Labs  Lab 11/28/20 2013 11/29/20 0255 11/30/20 0350  NA 136 133* 131*  K 3.9 4.0 3.7  CL 106 103 104  CO2 23 21* 19*  GLUCOSE 137* 165* 143*  BUN 13 11 11   CREATININE 0.54 0.57 0.70  CALCIUM 9.0 9.0 8.6*    GFR Estimated Creatinine Clearance: 38.7 mL/min (by C-G formula based on SCr of 0.7 mg/dL). Liver Function Tests: Recent Labs  Lab 11/28/20 2013  AST 31  ALT 26  ALKPHOS 80  BILITOT 1.6*  PROT 6.9  ALBUMIN 3.9    No results for input(s): LIPASE, AMYLASE in the last 168 hours. No results for input(s): AMMONIA in the last 168 hours. Coagulation profile No results for input(s): INR, PROTIME in the last 168 hours. COVID-19 Labs  No results for input(s): DDIMER,  FERRITIN, LDH, CRP in the last 72 hours.  Lab Results  Component Value Date   SARSCOV2NAA NEGATIVE 11/28/2020   SARSCOV2NAA NEGATIVE 04/13/2019   [place] NEGATIVE 04/10/2019    CBC: Recent Labs  Lab 11/28/20 2013 11/29/20 0255 11/30/20 0350  WBC 9.3 8.9 8.3  NEUTROABS 7.2  --   --   HGB 11.4* 10.5* 9.2*  HCT 34.2* 32.1* 27.9*  MCV 98.6 97.3 97.9  PLT 213 206 172    Cardiac Enzymes: No results for input(s): CKTOTAL, CKMB, CKMBINDEX, TROPONINI in the last 168 hours. BNP (last 3 results) No results for input(s): PROBNP in the last 8760 hours. CBG: Recent Labs  Lab 11/29/20 1551 11/29/20 2018 11/29/20 2302 11/30/20 0331 11/30/20 0736  GLUCAP 87 146* 148* 139* 117*    D-Dimer: No results for input(s): DDIMER in the last 72 hours. Hgb A1c: No results for input(s): HGBA1C in the last 72 hours. Lipid Profile: No results for input(s): CHOL, HDL, LDLCALC, TRIG, CHOLHDL, LDLDIRECT in the last 72 hours. Thyroid function studies: No results for input(s): TSH, T4TOTAL, T3FREE, THYROIDAB in the last 72 hours.  Invalid input(s): FREET3 Anemia work up: No results for input(s): VITAMINB12, FOLATE, FERRITIN, TIBC, IRON, RETICCTPCT in the last 72 hours. Sepsis Labs:  Recent Labs  Lab 11/28/20 2013 11/29/20 0255 11/30/20 0350  WBC 9.3 8.9 8.3    Microbiology Recent Results (from the past 240 hour(s))  Resp Panel by RT-PCR (Flu A&B, Covid) Nasopharyngeal Swab     Status: None   Collection Time: 11/28/20  8:13 PM   Specimen: Nasopharyngeal Swab; Nasopharyngeal(NP) swabs in vial transport medium  Result Value Ref Range Status   SARS Coronavirus 2 by RT PCR NEGATIVE NEGATIVE Final    Comment: (NOTE) SARS-CoV-2 target nucleic acids are NOT DETECTED.  The SARS-CoV-2 RNA is generally detectable in upper respiratory specimens during the acute phase of infection. The lowest concentration of SARS-CoV-2 viral copies this assay can detect is 138 copies/mL. A negative result  does not preclude SARS-Cov-2 infection and should not be used as the sole basis for treatment or other patient management decisions. A negative result may occur with  improper specimen collection/handling, submission of specimen other than nasopharyngeal swab, presence of viral mutation(s) within the areas targeted by this assay, and inadequate number of viral copies(<138 copies/mL). A negative result must be combined with clinical observations, patient history, and epidemiological information. The expected result is Negative.  Fact Sheet for Patients:  EntrepreneurPulse.com.au  Fact Sheet for Healthcare Providers:  IncredibleEmployment.be  This test is no t yet approved or cleared by the [place] FDA and  has been authorized for detection and/or diagnosis of SARS-CoV-2 by FDA under an Emergency Use Authorization (EUA). This EUA will remain  in effect (meaning this test can be used) for the duration of the COVID-19 declaration under Section 564(b)(1) of the Act, 21 U.S.C.section 360bbb-3(b)(1), unless the authorization is terminated  or revoked sooner.       Influenza A by PCR NEGATIVE NEGATIVE Final   Influenza B by PCR NEGATIVE NEGATIVE Final    Comment: (NOTE) The Xpert Xpress SARS-CoV-2/FLU/RSV plus assay is intended as an aid in the diagnosis of influenza from Nasopharyngeal swab specimens and should not be used as a sole basis for treatment. Nasal washings and aspirates are unacceptable for Xpert Xpress SARS-CoV-2/FLU/RSV testing.  Fact Sheet for Patients: EntrepreneurPulse.com.au  Fact Sheet for Healthcare Providers: IncredibleEmployment.be  This test is not yet approved or cleared by the [place] FDA and has been authorized for detection and/or diagnosis of SARS-CoV-2 by FDA under an Emergency Use Authorization (EUA). This EUA will remain in effect (meaning this test can be used) for  the duration of the COVID-19 declaration under Section 564(b)(1) of the Act, 21 U.S.C. section 360bbb-3(b)(1), unless the authorization is terminated or revoked.  Performed at [facility], [address] [place], [place] [address]   MRSA Next Gen by PCR, Nasal     Status: Abnormal   Collection Time: 11/29/20  2:37 AM   Specimen: Nasal Mucosa; Nasal Swab  Result Value Ref Range Status   MRSA by PCR Next Gen POSITIVE (A) NOT DETECTED Final    Comment: RESULT CALLED TO, READ BACK BY AND VERIFIED WITH: RN DAJA 11/29/20@4 :20 BY TW Performed at [facility], [place] [address] [place], [place] [address]      Medications:    docusate sodium  100 mg Oral BID   enoxaparin (LOVENOX) injection  40 mg Subcutaneous Q24H   famotidine  20 mg Oral BID   feeding supplement  237 mL Oral BID BM   insulin aspart  0-9 Units Subcutaneous Q4H   metoprolol tartrate  12.5 mg Oral BID   pantoprazole  40 mg Oral Daily  polyethylene glycol  17 g Oral BID   simvastatin  40 mg Oral Daily   Continuous Infusions:  sodium chloride 75 mL/hr at 11/29/20 1725      LOS: 2 days   Charlynne Cousins  [facility]  11/30/2020, 8:34 AM

## 2020-12-01 LAB — CBC
HCT: 27.1 % — ABNORMAL LOW (ref 36.0–46.0)
Hemoglobin: 8.9 g/dL — ABNORMAL LOW (ref 12.0–15.0)
MCH: 31.9 pg (ref 26.0–34.0)
MCHC: 32.8 g/dL (ref 30.0–36.0)
MCV: 97.1 fL (ref 80.0–100.0)
Platelets: 183 10*3/uL (ref 150–400)
RBC: 2.79 MIL/uL — ABNORMAL LOW (ref 3.87–5.11)
RDW: 14.7 % (ref 11.5–15.5)
WBC: 10.2 10*3/uL (ref 4.0–10.5)
nRBC: 0.2 % (ref 0.0–0.2)

## 2020-12-01 LAB — GLUCOSE, CAPILLARY
Glucose-Capillary: 155 mg/dL — ABNORMAL HIGH (ref 70–99)
Glucose-Capillary: 164 mg/dL — ABNORMAL HIGH (ref 70–99)
Glucose-Capillary: 189 mg/dL — ABNORMAL HIGH (ref 70–99)
Glucose-Capillary: 194 mg/dL — ABNORMAL HIGH (ref 70–99)
Glucose-Capillary: 238 mg/dL — ABNORMAL HIGH (ref 70–99)
Glucose-Capillary: 247 mg/dL — ABNORMAL HIGH (ref 70–99)

## 2020-12-01 LAB — BASIC METABOLIC PANEL
Anion gap: 8 (ref 5–15)
BUN: 12 mg/dL (ref 8–23)
CO2: 20 mmol/L — ABNORMAL LOW (ref 22–32)
Calcium: 8.6 mg/dL — ABNORMAL LOW (ref 8.9–10.3)
Chloride: 100 mmol/L (ref 98–111)
Creatinine, Ser: 0.61 mg/dL (ref 0.44–1.00)
GFR, Estimated: >60 mL/min (ref >60)
Glucose, Bld: 184 mg/dL — ABNORMAL HIGH (ref 70–99)
Potassium: 4.1 mmol/L (ref 3.5–5.1)
Sodium: 128 mmol/L — ABNORMAL LOW (ref 135–145)

## 2020-12-01 MED ORDER — POLYETHYLENE GLYCOL 3350 17 G PO PACK
17.0000 g | PACK | Freq: Two times a day (BID) | ORAL | Status: DC
  Administered 2020-12-02 – 2020-12-03 (×3): 17 g via ORAL
  Filled 2020-12-01 (×4): qty 1

## 2020-12-01 NOTE — Progress Notes (Signed)
Occupational Therapy Treatment Patient Details Name: Megan Salinas MRN: [id number] DOB: 11-06-1926 Today's Date: 12/01/2020   History of present illness 87 y.o. female admitted s/p fall with R femoral neck fracture . Underwent R hemiarthroplasty posterior) 11/17. Past medical history significant for CAD status post CABG in 3734, chronic diastolic heart failure, essential hypertension diabetes mellitus type 2 right femoral fracture shaft 15 to 20 years ago left hip fracture status post intramedullary nailing in March 2021.   OT comments  Patient with continued deficits as listed below.  She was able to stand better this session with no posterior lean, but difficulty standing straight, and upper body weakness and arthritic shoulders impacted her ability to push through her arms.  OT to continue efforts in the acute setting, but given the level of support needed, SNF for short term rehab is needed.     Recommendations for follow up therapy are one component of a multi-disciplinary discharge planning process, led by the attending physician.  Recommendations may be updated based on patient status, additional functional criteria and insurance authorization.    Follow Up Recommendations  Skilled nursing-short term rehab (<3 hours/day)    Assistance Recommended at Discharge Frequent or constant Supervision/Assistance  Equipment Recommendations  None recommended by OT    Recommendations for Other Services      Precautions / Restrictions Precautions Precautions: Fall;Posterior Hip Restrictions RLE Weight Bearing: Weight bearing as tolerated       Mobility Bed Mobility   Bed Mobility: Supine to Sit     Supine to sit: Max assist;HOB elevated          Transfers Overall transfer level: Needs assistance   Transfers: Sit to/from Stand;Bed to chair/wheelchair/BSC Sit to Stand: Mod assist;From elevated surface   Step pivot transfers: Max assist             Balance Overall  balance assessment: History of Falls;Needs assistance Sitting-balance support: Feet supported;Bilateral upper extremity supported Sitting balance-Leahy Scale: Fair     Standing balance support: Bilateral upper extremity supported;Reliant on assistive device for balance Standing balance-Leahy Scale: Poor                             ADL either performed or assessed with clinical judgement   ADL Overall ADL's : Needs assistance/impaired     Grooming: Set up;Sitting;Wash/dry hands;Wash/dry face               Lower Body Dressing: Maximal assistance;Sitting/lateral leans Lower Body Dressing Details (indicate cue type and reason): able to place sock on L foot             Functional mobility during ADLs: Maximal assistance;Rolling walker (2 wheels) General ADL Comments: additional support given so she could off load and move L foot    Extremity/Trunk Assessment Upper Extremity Assessment Upper Extremity Assessment: Generalized weakness   Lower Extremity Assessment Lower Extremity Assessment: Defer to PT evaluation   Cervical / Trunk Assessment Cervical / Trunk Assessment: Kyphotic    Vision Baseline Vision/History: 6 Macular Degeneration     Perception Perception Perception: Not tested   Praxis Praxis Praxis: Not tested    Cognition Arousal/Alertness: Awake/alert Behavior During Therapy: WFL for tasks assessed/performed Overall Cognitive Status: Within Functional Limits for tasks assessed  Pertinent Vitals/ Pain       Faces Pain Scale: Hurts even more Pain Location: R hip; L ankle Pain Descriptors / Indicators: Aching;Discomfort;Grimacing Pain Intervention(s): Monitored during session                                                          Frequency  Min 2X/week        Progress Toward Goals  OT Goals(current goals can now be  found in the care plan section)  Progress towards OT goals: Progressing toward goals  Acute Rehab OT Goals Patient Stated Goal: move better OT Goal Formulation: With patient/family Time For Goal Achievement: 12/14/20 Potential to Achieve Goals: Good  Plan Discharge plan remains appropriate    Co-evaluation                 AM-PAC OT "6 Clicks" Daily Activity     Outcome Measure   Help from another person eating meals?: None Help from another person taking care of personal grooming?: A Little Help from another person toileting, which includes using toliet, bedpan, or urinal?: A Lot Help from another person bathing (including washing, rinsing, drying)?: A Lot Help from another person to put on and taking off regular upper body clothing?: A Little Help from another person to put on and taking off regular lower body clothing?: A Lot 6 Click Score: 16    End of Session Equipment Utilized During Treatment: Gait belt;Rolling walker (2 wheels)  OT Visit Diagnosis: Unsteadiness on feet (R26.81);Other abnormalities of gait and mobility (R26.89);Muscle weakness (generalized) (M62.81);History of falling (Z91.81);Pain Pain - Right/Left: Right Pain - part of body: Hip;Ankle and joints of foot   Activity Tolerance Patient tolerated treatment well   Patient Left in chair;with call bell/phone within reach;with family/visitor present   Nurse Communication Mobility status        Time: 8242-3536 OT Time Calculation (min): 26 min  Charges: OT General Charges $OT Visit: 1 Visit OT Treatments $Self Care/Home Management : 8-22 mins $Therapeutic Activity: 8-22 mins  12/01/2020  RP, OTR/L  Acute Rehabilitation Services  Office:  [phone]   [name] 12/01/2020, 5:02 PM

## 2020-12-01 NOTE — NC FL2 (Signed)
[place] MEDICAID FL2 LEVEL OF CARE SCREENING TOOL     IDENTIFICATION  Patient Name: Megan Salinas Birthdate: September 27, 1926 Sex: female Admission Date (Current Location): 11/28/2020  [facility] and [place] Number:  Herbalist and Address:  [facility], [place] [address] [place], [place] [address]      Provider Number: [id number]  Attending Physician Name and Address:  Aileen Fass, Tammi Klippel, MD  Relative Name and Phone Number:       Current Level of Care: Hospital Recommended Level of Care: [place] Prior Approval Number:    Date Approved/Denied:   PASRR Number: [id number]  Discharge Plan: SNF    Current Diagnoses: Patient Active Problem List   Diagnosis Date Noted   Thyroid incidentaloma 11/29/2020   Closed right hip fracture, initial encounter ([place]) 11/28/2020   Lower limb ulcer, ankle, right, limited to breakdown of skin ([place]) 11/13/2020   Pain in limb 11/13/2020   Wears hearing aid in both ears 03/19/2020   Hemotympanum, left 10/04/2019   Sensorineural hearing loss of both ears 10/04/2019   Moderate aortic valve insufficiency 08/24/2019   Mild aortic stenosis 08/24/2019   Age-related osteoporosis with current pathological fracture with routine healing 04/18/2019   Displaced intertrochanteric fracture of left femur ([place]) 04/10/2019   Type 2 diabetes mellitus without complication ([place]) 36/64/4034   Preop examination 04/10/2019   Lymphedema 09/05/2018   Primary osteoarthritis of right wrist 02/22/2018   Chest pain 02/28/2017   Venous insufficiency of right lower extremity 09/02/2016   Swelling of limb 08/15/2016   Frequent PVCs 03/12/2016   Transient global amnesia 02/26/2016   TIA (transient ischemic attack) 01/28/2016   Carpal tunnel syndrome, right 08/13/2015   DDD (degenerative disc disease), lumbar 09/12/2014   Lumbar radiculitis 09/12/2014   Lumbar stenosis with neurogenic claudication 09/12/2014   GERD  (gastroesophageal reflux disease) 09/08/2014   History of type 2 diabetes mellitus 09/08/2014   Benign essential hypertension 06/16/2014   Moderate tricuspid insufficiency 06/07/2014   Abdominal pain, diffuse 03/09/2014   Chronic diastolic CHF (congestive heart failure) ([place]) 12/05/2013   Moderate mitral insufficiency 12/05/2013   Coronary artery disease 03/29/2013   Mixed hyperlipidemia 03/29/2013    Orientation RESPIRATION BLADDER Height & Weight     Self, Time, Situation, Place  Normal External catheter Weight: 143 lb 4.8 oz (65 kg) Height:  5\' 4"  (162.6 cm)  BEHAVIORAL SYMPTOMS/MOOD NEUROLOGICAL BOWEL NUTRITION STATUS      Continent Diet  AMBULATORY STATUS COMMUNICATION OF NEEDS Skin   Limited Assist Verbally Normal                       Personal Care Assistance Level of Assistance  Bathing, Feeding, Dressing Bathing Assistance: Limited assistance Feeding assistance: Limited assistance Dressing Assistance: Limited assistance     Functional Limitations Info  Sight, Hearing, Speech Sight Info: Adequate Hearing Info: Adequate Speech Info: Adequate    SPECIAL CARE FACTORS FREQUENCY  PT (By licensed PT), OT (By licensed OT)     PT Frequency: 5x week OT Frequency: 5x a week            Contractures Contractures Info: Not present    Additional Factors Info  Code Status, Allergies Code Status Info: full Allergies Info: Dust Mite Extract   Sulfa Antibiotics           Current Medications (12/01/2020):  This is the current hospital active medication list Current Facility-Administered Medications  Medication Dose Route Frequency  Provider Last Rate Last Admin   aspirin EC tablet 81 mg  81 mg Oral Daily Charlynne Cousins, MD   81 mg at 12/01/20 0940   docusate sodium (COLACE) capsule 100 mg  100 mg Oral BID Charlynne Cousins, MD   100 mg at 12/01/20 0940   enoxaparin (LOVENOX) injection 40 mg  40 mg Subcutaneous Q24H Charlynne Cousins, MD   40 mg at  12/01/20 0941   famotidine (PEPCID) tablet 20 mg  20 mg Oral BID Charlynne Cousins, MD   20 mg at 12/01/20 0940   feeding supplement (ENSURE ENLIVE / ENSURE PLUS) liquid 237 mL  237 mL Oral BID BM Charlynne Cousins, MD   237 mL at 12/01/20 0954   HYDROcodone-acetaminophen (NORCO/VICODIN) 5-325 MG per tablet 1 tablet  1 tablet Oral Q4H PRN Charlynne Cousins, MD       insulin aspart (novoLOG) injection 0-9 Units  0-9 Units Subcutaneous Q4H Charlynne Cousins, MD   1 Units at 12/01/20 0941   melatonin tablet 3 mg  3 mg Oral QHS Charlynne Cousins, MD   3 mg at 11/30/20 2212   menthol-cetylpyridinium (CEPACOL) lozenge 3 mg  1 lozenge Oral PRN Charlynne Cousins, MD       Or   phenol (CHLORASEPTIC) mouth spray 1 spray  1 spray Mouth/Throat PRN Charlynne Cousins, MD       methocarbamol (ROBAXIN) tablet 500 mg  500 mg Oral Q8H PRN Charlynne Cousins, MD       metoCLOPramide (REGLAN) tablet 5-10 mg  5-10 mg Oral Q8H PRN Charlynne Cousins, MD       Or   metoCLOPramide (REGLAN) injection 5-10 mg  5-10 mg Intravenous Q8H PRN Charlynne Cousins, MD       metoprolol tartrate (LOPRESSOR) tablet 12.5 mg  12.5 mg Oral BID Charlynne Cousins, MD   12.5 mg at 12/01/20 0939   morphine 2 MG/ML injection 1-2 mg  1-2 mg Intravenous Q3H PRN Charlynne Cousins, MD       ondansetron [facility]) tablet 4 mg  4 mg Oral Q6H PRN Charlynne Cousins, MD       Or   ondansetron [facility]) injection 4 mg  4 mg Intravenous Q6H PRN Charlynne Cousins, MD       pantoprazole (PROTONIX) EC tablet 40 mg  40 mg Oral Daily Charlynne Cousins, MD   40 mg at 12/01/20 0940   [START ON 12/02/2020] polyethylene glycol (MIRALAX / GLYCOLAX) packet 17 g  17 g Oral BID Charlynne Cousins, MD       psyllium (HYDROCIL/METAMUCIL) 2 packet  2 packet Oral Daily Charlynne Cousins, MD   2 packet at 12/01/20 1214   senna-docusate (Senokot-S) tablet 1 tablet  1 tablet Oral QHS PRN Charlynne Cousins, MD        simvastatin (ZOCOR) tablet 40 mg  40 mg Oral Daily Charlynne Cousins, MD   40 mg at 12/01/20 0940     Discharge Medications: Please see discharge summary for a list of discharge medications.  Relevant Imaging Results:  Relevant Lab Results:   Additional Information SS# [id number], covid vax x4  Emeterio Reeve, [place]

## 2020-12-01 NOTE — Anesthesia Postprocedure Evaluation (Signed)
Anesthesia Post Note  Patient: Megan Salinas  Procedure(s) Performed: ARTHROPLASTY BIPOLAR HIP (HEMIARTHROPLASTY) REMOVAL OF HARDWARE RIGHT FEMUR (Right: Hip)     Patient location during evaluation: PACU Anesthesia Type: Spinal Level of consciousness: awake and alert Pain management: pain level controlled Vital Signs Assessment: post-procedure vital signs reviewed and stable Respiratory status: spontaneous breathing, nonlabored ventilation and respiratory function stable Cardiovascular status: blood pressure returned to baseline and stable Postop Assessment: no apparent nausea or vomiting Anesthetic complications: no   No notable events documented.  Last Vitals:  Vitals:   11/30/20 2352 12/01/20 0313  BP: 106/60 (!) 102/57  Pulse: 93 91  Resp: 18 19  Temp: 37.1 C 37 C  SpO2: (!) 89% 90%    Last Pain:  Vitals:   12/01/20 0313  TempSrc: Oral  PainSc:                  [name]

## 2020-12-01 NOTE — Progress Notes (Signed)
Orthopaedic Trauma Service Progress Note  Patient ID: Megan Salinas MRN: [id number] DOB/AGE: [birth year] 87 y.o.  Subjective:  No acute issues   ROS  Objective:   VITALS:   Vitals:   11/30/20 1930 11/30/20 2352 12/01/20 0313 12/01/20 0742  BP: (!) 106/57 106/60 (!) 102/57 (!) 115/56  Pulse: 91 93 91 90  Resp: 16 18 19 17   Temp: 98.5 F (36.9 C) 98.7 F (37.1 C) 98.6 F (37 C) 97.6 F (36.4 C)  TempSrc: Oral Oral Oral   SpO2: 95% (!) 89% 90% 95%  Weight:      Height:        Estimated body mass index is 24.6 kg/m as calculated from the following:   Height as of this encounter: 5\' 4"  (1.626 m).   Weight as of this encounter: 65 kg.   Intake/Output      11/18 0701 11/19 0700 11/19 0701 11/20 0700   P.O. 240    I.V. (mL/kg)     IV Piggyback     Total Intake(mL/kg) 240 (3.7)    Urine (mL/kg/hr) 200 (0.1)    Stool 0    Blood     Total Output 200    Net +40         Urine Occurrence 3 x    Stool Occurrence 2 x      LABS  Results for orders placed or performed during the hospital encounter of 11/28/20 (from the past 24 hour(s))  Glucose, capillary     Status: Abnormal   Collection Time: 11/30/20  4:20 PM  Result Value Ref Range   Glucose-Capillary 210 (H) 70 - 99 mg/dL  Glucose, capillary     Status: Abnormal   Collection Time: 11/30/20  7:34 PM  Result Value Ref Range   Glucose-Capillary 222 (H) 70 - 99 mg/dL  Glucose, capillary     Status: Abnormal   Collection Time: 11/30/20 11:49 PM  Result Value Ref Range   Glucose-Capillary 195 (H) 70 - 99 mg/dL  CBC     Status: Abnormal   Collection Time: 12/01/20  1:50 AM  Result Value Ref Range   WBC 10.2 4.0 - 10.5 K/uL   RBC 2.79 (L) 3.87 - 5.11 MIL/uL   Hemoglobin 8.9 (L) 12.0 - 15.0 g/dL   HCT 27.1 (L) 36.0 - 46.0 %   MCV 97.1 80.0 - 100.0 fL   MCH 31.9 26.0 - 34.0 pg   MCHC 32.8 30.0 - 36.0 g/dL   RDW 14.7 11.5 - 15.5 %    Platelets 183 150 - 400 K/uL   nRBC 0.2 0.0 - 0.2 %  Basic metabolic panel     Status: Abnormal   Collection Time: 12/01/20  1:50 AM  Result Value Ref Range   Sodium 128 (L) 135 - 145 mmol/L   Potassium 4.1 3.5 - 5.1 mmol/L   Chloride 100 98 - 111 mmol/L   CO2 20 (L) 22 - 32 mmol/L   Glucose, Bld 184 (H) 70 - 99 mg/dL   BUN 12 8 - 23 mg/dL   Creatinine, Ser 0.61 0.44 - 1.00 mg/dL   Calcium 8.6 (L) 8.9 - 10.3 mg/dL   GFR, Estimated >60 >60 mL/min   Anion gap 8 5 - 15  Glucose, capillary     Status: Abnormal  Collection Time: 12/01/20  3:10 AM  Result Value Ref Range   Glucose-Capillary 189 (H) 70 - 99 mg/dL  Glucose, capillary     Status: Abnormal   Collection Time: 12/01/20  7:44 AM  Result Value Ref Range   Glucose-Capillary 155 (H) 70 - 99 mg/dL  Glucose, capillary     Status: Abnormal   Collection Time: 12/01/20 11:21 AM  Result Value Ref Range   Glucose-Capillary 238 (H) 70 - 99 mg/dL     PHYSICAL EXAM:   Gen: NAD Lungs: unlabored Ext:       Right Lower Extremity              Dressing R hip clean, dry and intact             Ext warm              Minimal swelling             + PT pulse             No DCT             Distal motor and sensory functions intact                            Assessment/Plan: 2 Days Post-Op    Anti-infectives (From admission, onward)    Start     Dose/Rate Route Frequency Ordered Stop   11/29/20 1800  ceFAZolin (ANCEF) IVPB 2g/100 mL premix        2 g 200 mL/hr over 30 Minutes Intravenous Every 6 hours 11/29/20 1651 11/29/20 2346   11/29/20 1045  ceFAZolin (ANCEF) IVPB 2g/100 mL premix        2 g 200 mL/hr over 30 Minutes Intravenous  Once 11/29/20 1038 11/29/20 1150     .  POD/HD#: 2  87 y/o female s/p fall with R femoral neck fracture    - fall   - R femoral neck fracture s/p R hip hemiarthroplasty              WBAT R leg             Posterior hip precautions             Dressing changes as needed starting  tomorrow             PT/OT             Ice prn                           TOC consult for snf                          Family wanting something in [place] [facility] as [place] is where pts family lives    - Pain management:             Multimodal              Minimze narcotics   - ABL anemia/Hemodynamics             Monitor    - Medical issues              Per primary    - DVT/PE prophylaxis:             Lovenox x 28 days    - ID:  Periop abx   - Metabolic Bone Disease:             Vitamin d insufficiency                          Supplement              Fracture suggestive of osteoporosis    - Activity:             As above   - Impediments to fracture healing:             Vitamin d insufficiency              Osteoporosis   - Dispo:             Ortho issues stable             TOC consult for SNF     Jari Pigg, PA-C [phone] (C) 12/01/2020, 12:24 PM  [facility] [place] [address] [phone] Jenetta [phone] (F)    After 5pm and on the weekends please log on to Amion, go to orthopaedics and the look under the [facility] Call for the provider(s) on call. You can also call our office at [phone] and then follow the prompts to be connected to the call team.

## 2020-12-01 NOTE — Progress Notes (Signed)
[facility] PROGRESS NOTE    Progress Note  Megan Salinas  JSE:[id number] DOB: September 17, 1926 DOA: 11/28/2020 PCP: [name], MD     Brief Narrative:   Megan Salinas is an 87 y.o. female past medical history significant for CAD status post CABG in 0737, chronic diastolic heart failure, essential hypertension diabetes mellitus type 2 right femoral fracture shaft 15 to 20 years ago left hip fracture status post intramedullary nailing in March 2021 now presents to the ED with severe right hip pain after fall, right knee x-ray was negative for acute findings as well as CT of the head, x-ray of the right hip showed left subcapital femoral neck fracture involving the right femoral diaphysis.   Assessment/Plan:   Closed right hip fracture, initial encounter [facility]) She is status post surgical intervention on 11/29/2020. Pain is being controlled with just Tylenol.   Continue MiraLAX.  Physical therapy evaluated the patient recommended skilled nursing facility placement.   Orthopedic surgery recommended Lovenox for 28 days. Family is requesting placement as [facility].   CAD: Denies any exertional shortness of breath or chest pain. Hold aspirin for surgical procedure.  Chronic diastolic heart failure: With a last EF in March 2021 to 50%, continue to hold ACE inhibitor. She appears euvolemic.  Diabetes mellitus type 2: With an A1c of 6.8. Currently on sliding scale insulin blood glucose seems to be fairly  Essential hypertension: Blood pressure seems to be able goal, hold ACE inhibitor.  Right ankle ulcer: Follow with by vascular surgery as an outpatient. No acute ischemia, does not appear to be infected. ABI done on the 15th still pending.  Incidental thyroid nodule: Noted follow-up as an outpatient at her age risk and benefits were discussed.   DVT prophylaxis: Lovenox Family Communication:none Status is: Inpatient  Remains inpatient appropriate  because: Acute right hip fracture for surgical intervention.    Code Status:     Code Status Orders  (From admission, onward)           Start     Ordered   11/29/20 0006  Full code  Continuous        11/29/20 0007           Code Status History     Date Active Date Inactive Code Status Order ID Comments User Context   04/10/2019 2239 04/15/2019 0043 Full Code [id number]  [name], MD ED   02/28/2017 1612 03/01/2017 1748 Full Code [id number]  [name], MD Inpatient   01/28/2016 2215 01/29/2016 1912 Full Code [id number]  [name], MD Inpatient      Advance Directive Documentation    Flowsheet Row Most Recent Value  Type of Advance Directive Healthcare Power of [place], Living will  Pre-existing out of facility DNR order (yellow form or pink MOST form) --  "MOST" Form in Place? --         IV Access:   Peripheral IV   Procedures and diagnostic studies:   DG C-Arm 1-60 Min-No Report  Result Date: 11/29/2020 CLINICAL DATA:  Hardware removal EXAM: RIGHT FEMUR 2 VIEWS; DG C-ARM 1-60 MIN-NO REPORT COMPARISON:  11/28/2020 FINDINGS: A single C-arm fluoroscopic image was obtained intraoperatively and submitted for post operative interpretation. Single image obtained after screw removal of multiple screws within the proximal aspect of the patient's right femoral fixation construct. 11 seconds fluoroscopy time was utilized. Please see the performing provider's procedural report for further detail. IMPRESSION: As above. Electronically Signed   By:  Nicholas  Plundo D.O.   On: 11/29/2020 14:50   DG Hip Port Unilat With Pelvis 1V Right  Result Date: 11/29/2020 CLINICAL DATA:  Right hip arthroplasty EXAM: DG HIP (WITH OR WITHOUT PELVIS) 1V PORT RIGHT COMPARISON:  11/28/2020 FINDINGS: Interval postsurgical changes from right hip hemiarthroplasty. Arthroplasty components are in their expected alignment. No periprosthetic fracture or evidence of other complication.  There is also been interval removal of the proximal screws related to patient's prior femoral fixation construct. Expected postoperative changes within the overlying soft tissues. Advanced atherosclerotic calcifications. IMPRESSION: Interval postsurgical changes from right hip hemiarthroplasty. Electronically Signed   By: Davina Poke D.O.   On: 11/29/2020 14:48   DG FEMUR, MIN 2 VIEWS RIGHT  Result Date: 11/29/2020 CLINICAL DATA:  Hardware removal EXAM: RIGHT FEMUR 2 VIEWS; DG C-ARM 1-60 MIN-NO REPORT COMPARISON:  11/28/2020 FINDINGS: A single C-arm fluoroscopic image was obtained intraoperatively and submitted for post operative interpretation. Single image obtained after screw removal of multiple screws within the proximal aspect of the patient's right femoral fixation construct. 11 seconds fluoroscopy time was utilized. Please see the performing provider's procedural report for further detail. IMPRESSION: As above. Electronically Signed   By: Davina Poke D.O.   On: 11/29/2020 14:50     Medical Consultants:   None.   Subjective:    Megan Salinas relates her pain is controlled had a bowel movement.  Objective:    Vitals:   11/30/20 1930 11/30/20 2352 12/01/20 0313 12/01/20 0742  BP: (!) 106/57 106/60 (!) 102/57 (!) 115/56  Pulse: 91 93 91 90  Resp: 16 18 19 17   Temp: 98.5 F (36.9 C) 98.7 F (37.1 C) 98.6 F (37 C) 97.6 F (36.4 C)  TempSrc: Oral Oral Oral   SpO2: 95% (!) 89% 90% 95%  Weight:      Height:       SpO2: 95 %   Intake/Output Summary (Last 24 hours) at 12/01/2020 0908 Last data filed at 11/30/2020 2300 Gross per 24 hour  Intake --  Output 200 ml  Net -200 ml    Filed Weights   11/28/20 2310  Weight: 65 kg    Exam: General exam: In no acute distress. Respiratory system: Good air movement and clear to auscultation. Cardiovascular system: S1 & S2 heard, RRR. No JVD. Gastrointestinal system: Abdomen is nondistended, soft and nontender.   Extremities: No pedal edema. Skin: No rashes, lesions or ulcers Psychiatry: Judgement and insight appear normal. Mood & affect appropriate.   Data Reviewed:    Labs: Basic Metabolic Panel: Recent Labs  Lab 11/28/20 2013 11/29/20 0255 11/30/20 0350 12/01/20 0150  NA 136 133* 131* 128*  K 3.9 4.0 3.7 4.1  CL 106 103 104 100  CO2 23 21* 19* 20*  GLUCOSE 137* 165* 143* 184*  BUN 13 11 11 12   CREATININE 0.54 0.57 0.70 0.61  CALCIUM 9.0 9.0 8.6* 8.6*    GFR Estimated Creatinine Clearance: 38.7 mL/min (by C-G formula based on SCr of 0.61 mg/dL). Liver Function Tests: Recent Labs  Lab 11/28/20 2013  AST 31  ALT 26  ALKPHOS 80  BILITOT 1.6*  PROT 6.9  ALBUMIN 3.9    No results for input(s): LIPASE, AMYLASE in the last 168 hours. No results for input(s): AMMONIA in the last 168 hours. Coagulation profile No results for input(s): INR, PROTIME in the last 168 hours. COVID-19 Labs  No results for input(s): DDIMER, FERRITIN, LDH, CRP in the last 72 hours.  Lab Results  Component Value Date   SARSCOV2NAA NEGATIVE 11/28/2020   [place] NEGATIVE 04/13/2019   [place] NEGATIVE 04/10/2019    CBC: Recent Labs  Lab 11/28/20 2013 11/29/20 0255 11/30/20 0350 12/01/20 0150  WBC 9.3 8.9 8.3 10.2  NEUTROABS 7.2  --   --   --   HGB 11.4* 10.5* 9.2* 8.9*  HCT 34.2* 32.1* 27.9* 27.1*  MCV 98.6 97.3 97.9 97.1  PLT 213 206 172 183    Cardiac Enzymes: No results for input(s): CKTOTAL, CKMB, CKMBINDEX, TROPONINI in the last 168 hours. BNP (last 3 results) No results for input(s): PROBNP in the last 8760 hours. CBG: Recent Labs  Lab 11/30/20 1620 11/30/20 1934 11/30/20 2349 12/01/20 0310 12/01/20 0744  GLUCAP 210* 222* 195* 189* 155*    D-Dimer: No results for input(s): DDIMER in the last 72 hours. Hgb A1c: No results for input(s): HGBA1C in the last 72 hours. Lipid Profile: No results for input(s): CHOL, HDL, LDLCALC, TRIG, CHOLHDL, LDLDIRECT in the  last 72 hours. Thyroid function studies: No results for input(s): TSH, T4TOTAL, T3FREE, THYROIDAB in the last 72 hours.  Invalid input(s): FREET3 Anemia work up: No results for input(s): VITAMINB12, FOLATE, FERRITIN, TIBC, IRON, RETICCTPCT in the last 72 hours. Sepsis Labs: Recent Labs  Lab 11/28/20 2013 11/29/20 0255 11/30/20 0350 12/01/20 0150  WBC 9.3 8.9 8.3 10.2    Microbiology Recent Results (from the past 240 hour(s))  Resp Panel by RT-PCR (Flu A&B, Covid) Nasopharyngeal Swab     Status: None   Collection Time: 11/28/20  8:13 PM   Specimen: Nasopharyngeal Swab; Nasopharyngeal(NP) swabs in vial transport medium  Result Value Ref Range Status   SARS Coronavirus 2 by RT PCR NEGATIVE NEGATIVE Final    Comment: (NOTE) SARS-CoV-2 target nucleic acids are NOT DETECTED.  The SARS-CoV-2 RNA is generally detectable in upper respiratory specimens during the acute phase of infection. The lowest concentration of SARS-CoV-2 viral copies this assay can detect is 138 copies/mL. A negative result does not preclude SARS-Cov-2 infection and should not be used as the sole basis for treatment or other patient management decisions. A negative result may occur with  improper specimen collection/handling, submission of specimen other than nasopharyngeal swab, presence of viral mutation(s) within the areas targeted by this assay, and inadequate number of viral copies(<138 copies/mL). A negative result must be combined with clinical observations, patient history, and epidemiological information. The expected result is Negative.  Fact Sheet for Patients:  EntrepreneurPulse.com.au  Fact Sheet for Healthcare Providers:  IncredibleEmployment.be  This test is no t yet approved or cleared by the [place] FDA and  has been authorized for detection and/or diagnosis of SARS-CoV-2 by FDA under an Emergency Use Authorization (EUA). This EUA will remain  in  effect (meaning this test can be used) for the duration of the COVID-19 declaration under Section 564(b)(1) of the Act, 21 U.S.C.section 360bbb-3(b)(1), unless the authorization is terminated  or revoked sooner.       Influenza A by PCR NEGATIVE NEGATIVE Final   Influenza B by PCR NEGATIVE NEGATIVE Final    Comment: (NOTE) The Xpert Xpress SARS-CoV-2/FLU/RSV plus assay is intended as an aid in the diagnosis of influenza from Nasopharyngeal swab specimens and should not be used as a sole basis for treatment. Nasal washings and aspirates are unacceptable for Xpert Xpress SARS-CoV-2/FLU/RSV testing.  Fact Sheet for Patients: EntrepreneurPulse.com.au  Fact Sheet for Healthcare Providers: IncredibleEmployment.be  This test is not yet approved or cleared by the [place] FDA  and has been authorized for detection and/or diagnosis of SARS-CoV-2 by FDA under an Emergency Use Authorization (EUA). This EUA will remain in effect (meaning this test can be used) for the duration of the COVID-19 declaration under Section 564(b)(1) of the Act, 21 U.S.C. section 360bbb-3(b)(1), unless the authorization is terminated or revoked.  Performed at [facility], [address] [place], [place] [address]   MRSA Next Gen by PCR, Nasal     Status: Abnormal   Collection Time: 11/29/20  2:37 AM   Specimen: Nasal Mucosa; Nasal Swab  Result Value Ref Range Status   MRSA by PCR Next Gen POSITIVE (A) NOT DETECTED Final    Comment: RESULT CALLED TO, READ BACK BY AND VERIFIED WITH: RN DAJA 11/29/20@4 :20 BY TW Performed at [facility], [place] [address], [place], [place] [address]      Medications:    aspirin EC  81 mg Oral Daily   docusate sodium  100 mg Oral BID   enoxaparin (LOVENOX) injection  40 mg Subcutaneous Q24H   famotidine  20 mg Oral BID   feeding supplement  237 mL Oral BID BM   insulin aspart  0-9 Units Subcutaneous Q4H   melatonin   3 mg Oral QHS   metoprolol tartrate  12.5 mg Oral BID   pantoprazole  40 mg Oral Daily   polyethylene glycol  17 g Oral BID   psyllium  2 packet Oral Daily   simvastatin  40 mg Oral Daily   Continuous Infusions:      LOS: 3 days   Charlynne Cousins  [facility]  12/01/2020, 9:08 AM

## 2020-12-01 NOTE — TOC Initial Note (Signed)
Transition of Care [facility]) - Initial/Assessment Note    Patient Details  Name: Megan Salinas MRN: [id number] Date of Birth: 1926/04/16  [facility] CM/SW Contact:    Emeterio Reeve, LCSW Phone Number: 12/01/2020, 1:22 PM  Clinical Narrative:                 CSW received SNF consult. CSW met with pt at bedside, pt was sleeping. CSW called daughter Lattie Haw.CSW introduced self and explained role at the hospital. Lattie Haw reports that PTA pt was living at [facility]. Pt was independent with mobility and ADL's.   CSW reviewed PT/OT recommendations for SNF. Lattie Haw feels SNF is beneficial. Lattie Haw would like for pt to go to SNF in [place] informed Lattie Haw that hospital transport will need to be paid out of pocket, lisa expressed understanding. CSW gave pt medicare.gov rating list to review via text. Lattie Haw will review and let us know what facilitys they are interested in. CSW explained insurance auth process. Pt reports they are covid vaccinated.  CSW will continue to follow.   Expected Discharge Plan: Skilled Nursing Facility Barriers to Discharge: Continued Medical Work up   Patient Goals and CMS Choice Patient states their goals for this hospitalization and ongoing recovery are:: Pt was sleeping, unable to voice goals CMS Medicare.gov Compare Post Acute Care list provided to:: Patient Choice offered to / list presented to : Patient  Expected Discharge Plan and Services Expected Discharge Plan: [place]       Living arrangements for the past 2 months: [place]                                      Prior Living Arrangements/Services Living arrangements for the past 2 months: [place] Lives with:: Facility Resident Patient language and need for interpreter reviewed:: Yes Do you feel safe going back to the place where you live?: Yes      Need for Family Participation in Patient Care: Yes (Comment) Care giver  support system in place?: Yes (comment)   Criminal Activity/Legal Involvement Pertinent to Current Situation/Hospitalization: No - Comment as needed  Activities of Daily Living      Permission Sought/Granted Permission sought to share information with : Family Supports Permission granted to share information with : Yes, Verbal Permission Granted              Emotional Assessment Appearance:: Appears stated age Attitude/Demeanor/Rapport: Engaged Affect (typically observed): Appropriate Orientation: : Oriented to Self, Oriented to Place, Oriented to  Time, Oriented to Situation Alcohol / Substance Use: Not Applicable Psych Involvement: No (comment)  Admission diagnosis:  Closed right hip fracture, initial encounter ([place]) [S72.001A] Patient Active Problem List   Diagnosis Date Noted   Thyroid incidentaloma 11/29/2020   Closed right hip fracture, initial encounter ([place]) 11/28/2020   Lower limb ulcer, ankle, right, limited to breakdown of skin ([place]) 11/13/2020   Pain in limb 11/13/2020   Wears hearing aid in both ears 03/19/2020   Hemotympanum, left 10/04/2019   Sensorineural hearing loss of both ears 10/04/2019   Moderate aortic valve insufficiency 08/24/2019   Mild aortic stenosis 08/24/2019   Age-related osteoporosis with current pathological fracture with routine healing 04/18/2019   Displaced intertrochanteric fracture of left femur ([place]) 04/10/2019   Type 2 diabetes mellitus without complication ([place]) 33/43/5686   Preop examination 04/10/2019   Lymphedema 09/05/2018  Primary osteoarthritis of right wrist 02/22/2018   Chest pain 02/28/2017   Venous insufficiency of right lower extremity 09/02/2016   Swelling of limb 08/15/2016   Frequent PVCs 03/12/2016   Transient global amnesia 02/26/2016   TIA (transient ischemic attack) 01/28/2016   Carpal tunnel syndrome, right 08/13/2015   DDD (degenerative disc disease), lumbar 09/12/2014   Lumbar radiculitis 09/12/2014    Lumbar stenosis with neurogenic claudication 09/12/2014   GERD (gastroesophageal reflux disease) 09/08/2014   History of type 2 diabetes mellitus 09/08/2014   Benign essential hypertension 06/16/2014   Moderate tricuspid insufficiency 06/07/2014   Abdominal pain, diffuse 03/09/2014   Chronic diastolic CHF (congestive heart failure) ([place]) 12/05/2013   Moderate mitral insufficiency 12/05/2013   Coronary artery disease 03/29/2013   Mixed hyperlipidemia 03/29/2013   PCP:  [name], MD Pharmacy:   [id number] [address], [address] [place] [address] Phone: [phone] Fax: [phone]  [facility] [place], [place] - [address] [place] [address] Phone: [phone] Fax: [phone]     Social Determinants of Health (SDOH) Interventions    Readmission Risk Interventions No flowsheet data found.  Emeterio Reeve, LCSW Clinical Social Worker

## 2020-12-02 LAB — GLUCOSE, CAPILLARY
Glucose-Capillary: 125 mg/dL — ABNORMAL HIGH (ref 70–99)
Glucose-Capillary: 120 mg/dL — ABNORMAL HIGH (ref 70–99)
Glucose-Capillary: 206 mg/dL — ABNORMAL HIGH (ref 70–99)
Glucose-Capillary: 124 mg/dL — ABNORMAL HIGH (ref 70–99)
Glucose-Capillary: 183 mg/dL — ABNORMAL HIGH (ref 70–99)

## 2020-12-02 LAB — CBC
HCT: 27 % — ABNORMAL LOW (ref 36.0–46.0)
Hemoglobin: 9.3 g/dL — ABNORMAL LOW (ref 12.0–15.0)
MCH: 33.1 pg (ref 26.0–34.0)
MCHC: 34.4 g/dL (ref 30.0–36.0)
MCV: 96.1 fL (ref 80.0–100.0)
Platelets: 189 10*3/uL (ref 150–400)
RBC: 2.81 MIL/uL — ABNORMAL LOW (ref 3.87–5.11)
RDW: 14.6 % (ref 11.5–15.5)
WBC: 9.3 10*3/uL (ref 4.0–10.5)
nRBC: 0.2 % (ref 0.0–0.2)

## 2020-12-02 MED ORDER — NYSTATIN 100000 UNIT/GM EX POWD
Freq: Three times a day (TID) | CUTANEOUS | Status: DC
  Filled 2020-12-02: qty 15

## 2020-12-02 NOTE — Progress Notes (Signed)
[facility] PROGRESS NOTE    Progress Note  Megan Salinas  JZP:[id number] DOB: 13-Feb-1926 DOA: 11/28/2020 PCP: [name], MD     Brief Narrative:   Megan Salinas is an 87 y.o. female past medical history significant for CAD status post CABG in 4801, chronic diastolic heart failure, essential hypertension diabetes mellitus type 2 right femoral fracture shaft 15 to 20 years ago left hip fracture status post intramedullary nailing in March 2021 now presents to the ED with severe right hip pain after fall, right knee x-ray was negative for acute findings as well as CT of the head, x-ray of the right hip showed left subcapital femoral neck fracture involving the right femoral diaphysis.   Assessment/Plan:   Closed right hip fracture, initial encounter [facility]) She is status post surgical intervention on 11/29/2020. Pain is being controlled with just Tylenol.   Continue MiraLAX.   Physical therapy evaluated the patient recommended skilled nursing facility placement.   Orthopedic surgery recommended Lovenox for 28 days. Family is requesting placement as [facility]. Awaiting inpatient rehab placement.  CAD: Denies any exertional shortness of breath or chest pain. Hold aspirin for surgical procedure.  Chronic diastolic heart failure: With a last EF in March 2021 to 50%, continue to hold ACE inhibitor. She appears euvolemic.  Diabetes mellitus type 2: With an A1c of 6.8. Currently on sliding scale insulin blood glucose seems to be fairly  Essential hypertension: Blood pressure seems to be able goal, hold ACE inhibitor.  Right ankle ulcer: Follow with by vascular surgery as an outpatient. No acute ischemia, does not appear to be infected. ABI done on the 15th still pending.  Incidental thyroid nodule: Noted follow-up as an outpatient at her age risk and benefits were discussed.   DVT prophylaxis: Lovenox Family Communication:none Status is:  Inpatient  Remains inpatient appropriate because: Acute right hip fracture for surgical intervention.    Code Status:     Code Status Orders  (From admission, onward)           Start     Ordered   11/29/20 0006  Full code  Continuous        11/29/20 0007           Code Status History     Date Active Date Inactive Code Status Order ID Comments User Context   04/10/2019 2239 04/15/2019 0043 Full Code [id number]  [name], MD ED   02/28/2017 1612 03/01/2017 1748 Full Code [id number]  [name], MD Inpatient   01/28/2016 2215 01/29/2016 1912 Full Code [id number]  [name], MD Inpatient      Advance Directive Documentation    Flowsheet Row Most Recent Value  Type of Advance Directive Healthcare Power of Attorney, Living will  Pre-existing out of facility DNR order (yellow form or pink MOST form) --  "MOST" Form in Place? --         IV Access:   Peripheral IV   Procedures and diagnostic studies:   No results found.   Medical Consultants:   None.   Subjective:    Megan Salinas pain controlled, groin itching  Objective:    Vitals:   12/01/20 1358 12/01/20 1939 12/02/20 0500 12/02/20 0842  BP: (!) 115/55 (!) 113/53 122/61 127/61  Pulse: 88 87 78 81  Resp: 18 17 17 18   Temp: 98.2 F (36.8 C) 97.7 F (36.5 C) 98.4 F (36.9 C) 98.1 F (36.7 C)  TempSrc:  Oral  SpO2: 93% 93% 98% 98%  Weight:      Height:       SpO2: 98 %  No intake or output data in the 24 hours ending 12/02/20 0906  Filed Weights   11/28/20 2310  Weight: 65 kg    Exam: General exam: In no acute distress. Respiratory system: Good air movement and clear to auscultation. Cardiovascular system: S1 & S2 heard, RRR. No JVD. Gastrointestinal system: Abdomen is nondistended, soft and nontender.  Extremities: No pedal edema. Skin: No rashes, lesions or ulcers Psychiatry: Judgement and insight appear normal. Mood & affect appropriate.   Data Reviewed:     Labs: Basic Metabolic Panel: Recent Labs  Lab 11/28/20 2013 11/29/20 0255 11/30/20 0350 12/01/20 0150  NA 136 133* 131* 128*  K 3.9 4.0 3.7 4.1  CL 106 103 104 100  CO2 23 21* 19* 20*  GLUCOSE 137* 165* 143* 184*  BUN 13 11 11 12   CREATININE 0.54 0.57 0.70 0.61  CALCIUM 9.0 9.0 8.6* 8.6*    GFR Estimated Creatinine Clearance: 38.7 mL/min (by C-G formula based on SCr of 0.61 mg/dL). Liver Function Tests: Recent Labs  Lab 11/28/20 2013  AST 31  ALT 26  ALKPHOS 80  BILITOT 1.6*  PROT 6.9  ALBUMIN 3.9    No results for input(s): LIPASE, AMYLASE in the last 168 hours. No results for input(s): AMMONIA in the last 168 hours. Coagulation profile No results for input(s): INR, PROTIME in the last 168 hours. COVID-19 Labs  No results for input(s): DDIMER, FERRITIN, LDH, CRP in the last 72 hours.  Lab Results  Component Value Date   SARSCOV2NAA NEGATIVE 11/28/2020   SARSCOV2NAA NEGATIVE 04/13/2019   [place] NEGATIVE 04/10/2019    CBC: Recent Labs  Lab 11/28/20 2013 11/29/20 0255 11/30/20 0350 12/01/20 0150 12/02/20 0301  WBC 9.3 8.9 8.3 10.2 9.3  NEUTROABS 7.2  --   --   --   --   HGB 11.4* 10.5* 9.2* 8.9* 9.3*  HCT 34.2* 32.1* 27.9* 27.1* 27.0*  MCV 98.6 97.3 97.9 97.1 96.1  PLT 213 206 172 183 189    Cardiac Enzymes: No results for input(s): CKTOTAL, CKMB, CKMBINDEX, TROPONINI in the last 168 hours. BNP (last 3 results) No results for input(s): PROBNP in the last 8760 hours. CBG: Recent Labs  Lab 12/01/20 1518 12/01/20 2037 12/01/20 2356 12/02/20 0340 12/02/20 0840  GLUCAP 194* 247* 164* 125* 120*    D-Dimer: No results for input(s): DDIMER in the last 72 hours. Hgb A1c: No results for input(s): HGBA1C in the last 72 hours. Lipid Profile: No results for input(s): CHOL, HDL, LDLCALC, TRIG, CHOLHDL, LDLDIRECT in the last 72 hours. Thyroid function studies: No results for input(s): TSH, T4TOTAL, T3FREE, THYROIDAB in the last 72  hours.  Invalid input(s): FREET3 Anemia work up: No results for input(s): VITAMINB12, FOLATE, FERRITIN, TIBC, IRON, RETICCTPCT in the last 72 hours. Sepsis Labs: Recent Labs  Lab 11/29/20 0255 11/30/20 0350 12/01/20 0150 12/02/20 0301  WBC 8.9 8.3 10.2 9.3    Microbiology Recent Results (from the past 240 hour(s))  Resp Panel by RT-PCR (Flu A&B, Covid) Nasopharyngeal Swab     Status: None   Collection Time: 11/28/20  8:13 PM   Specimen: Nasopharyngeal Swab; Nasopharyngeal(NP) swabs in vial transport medium  Result Value Ref Range Status   SARS Coronavirus 2 by RT PCR NEGATIVE NEGATIVE Final    Comment: (NOTE) SARS-CoV-2 target nucleic acids are NOT DETECTED.  The SARS-CoV-2 RNA  is generally detectable in upper respiratory specimens during the acute phase of infection. The lowest concentration of SARS-CoV-2 viral copies this assay can detect is 138 copies/mL. A negative result does not preclude SARS-Cov-2 infection and should not be used as the sole basis for treatment or other patient management decisions. A negative result may occur with  improper specimen collection/handling, submission of specimen other than nasopharyngeal swab, presence of viral mutation(s) within the areas targeted by this assay, and inadequate number of viral copies(<138 copies/mL). A negative result must be combined with clinical observations, patient history, and epidemiological information. The expected result is Negative.  Fact Sheet for Patients:  EntrepreneurPulse.com.au  Fact Sheet for Healthcare Providers:  IncredibleEmployment.be  This test is no t yet approved or cleared by the [place] FDA and  has been authorized for detection and/or diagnosis of SARS-CoV-2 by FDA under an Emergency Use Authorization (EUA). This EUA will remain  in effect (meaning this test can be used) for the duration of the COVID-19 declaration under Section 564(b)(1) of the  Act, 21 U.S.C.section 360bbb-3(b)(1), unless the authorization is terminated  or revoked sooner.       Influenza A by PCR NEGATIVE NEGATIVE Final   Influenza B by PCR NEGATIVE NEGATIVE Final    Comment: (NOTE) The Xpert Xpress SARS-CoV-2/FLU/RSV plus assay is intended as an aid in the diagnosis of influenza from Nasopharyngeal swab specimens and should not be used as a sole basis for treatment. Nasal washings and aspirates are unacceptable for Xpert Xpress SARS-CoV-2/FLU/RSV testing.  Fact Sheet for Patients: EntrepreneurPulse.com.au  Fact Sheet for Healthcare Providers: IncredibleEmployment.be  This test is not yet approved or cleared by the [place] FDA and has been authorized for detection and/or diagnosis of SARS-CoV-2 by FDA under an Emergency Use Authorization (EUA). This EUA will remain in effect (meaning this test can be used) for the duration of the COVID-19 declaration under Section 564(b)(1) of the Act, 21 U.S.C. section 360bbb-3(b)(1), unless the authorization is terminated or revoked.  Performed at [facility], [address] [place], [place] [address]   MRSA Next Gen by PCR, Nasal     Status: Abnormal   Collection Time: 11/29/20  2:37 AM   Specimen: Nasal Mucosa; Nasal Swab  Result Value Ref Range Status   MRSA by PCR Next Gen POSITIVE (A) NOT DETECTED Final    Comment: RESULT CALLED TO, READ BACK BY AND VERIFIED WITH: RN DAJA 11/29/20@4 :20 BY TW Performed at [facility], [place] [address] [place], [place] [address]      Medications:    aspirin EC  81 mg Oral Daily   docusate sodium  100 mg Oral BID   enoxaparin (LOVENOX) injection  40 mg Subcutaneous Q24H   famotidine  20 mg Oral BID   feeding supplement  237 mL Oral BID BM   insulin aspart  0-9 Units Subcutaneous Q4H   melatonin  3 mg Oral QHS   metoprolol tartrate  12.5 mg Oral BID   pantoprazole  40 mg Oral Daily   polyethylene glycol  17  g Oral BID   psyllium  2 packet Oral Daily   simvastatin  40 mg Oral Daily   Continuous Infusions:      LOS: 4 days   Charlynne Cousins  [facility]  12/02/2020, 9:06 AM

## 2020-12-02 NOTE — TOC Progression Note (Addendum)
Transition of Care [facility]) - Progression Note    Patient Details  Name: Megan Salinas MRN: [id number] Date of Birth: Jul 26, 1926  Transition of Care [facility]) CM/SW [place], [place] Phone Number: 12/02/2020, 12:57 PM  Clinical Narrative:     Update- Received call from [facility] admissions who confirmed they will review patients referral tomorrow and also answered patients daughters question. CSW will follow up with [facility] tomorrow on referral. CSW called patients daughter Megan Salinas and updated her. CSW will continue to follow.  CSW spoke with patients daughter Megan Salinas who gave CSW permission to fax out initial referral to [facility] .CSW called lvm for admissions over at [facility]. CSW awaiting callback. Patients daughter wants CSW to ask once patient is there for short term rehab if patient will be able to transfer to another short term rehab that will be closer to family if available. CSW will continue to follow and assist with patients dc planning needs.  Expected Discharge Plan: [place] Barriers to Discharge: Continued Medical Work up  Expected Discharge Plan and Services Expected Discharge Plan: [place] arrangements for the past 2 months: [place]                                       Social Determinants of Health (SDOH) Interventions    Readmission Risk Interventions No flowsheet data found.

## 2020-12-03 ENCOUNTER — Encounter (INDEPENDENT_AMBULATORY_CARE_PROVIDER_SITE_OTHER): Payer: Self-pay

## 2020-12-03 ENCOUNTER — Ambulatory Visit (INDEPENDENT_AMBULATORY_CARE_PROVIDER_SITE_OTHER): Payer: Medicare Other | Admitting: Nurse Practitioner

## 2020-12-03 LAB — RESP PANEL BY RT-PCR (FLU A&B, COVID) ARPGX2
Influenza A by PCR: NEGATIVE
Influenza B by PCR: NEGATIVE
SARS Coronavirus 2 by RT PCR: NEGATIVE

## 2020-12-03 LAB — GLUCOSE, CAPILLARY
Glucose-Capillary: 107 mg/dL — ABNORMAL HIGH (ref 70–99)
Glucose-Capillary: 120 mg/dL — ABNORMAL HIGH (ref 70–99)
Glucose-Capillary: 122 mg/dL — ABNORMAL HIGH (ref 70–99)
Glucose-Capillary: 146 mg/dL — ABNORMAL HIGH (ref 70–99)
Glucose-Capillary: 187 mg/dL — ABNORMAL HIGH (ref 70–99)

## 2020-12-03 MED ORDER — ENOXAPARIN SODIUM 40 MG/0.4ML IJ SOSY: 40.0000 mg | PREFILLED_SYRINGE | INTRAMUSCULAR | Status: DC

## 2020-12-03 MED ORDER — CALCIUM CITRATE 950 (200 CA) MG PO TABS: 200.0000 mg | ORAL_TABLET | Freq: Two times a day (BID) | ORAL | 2 refills | Status: DC

## 2020-12-03 MED ORDER — VITAMIN D 125 MCG (5000 UT) PO CAPS: 1.0000 | ORAL_CAPSULE | Freq: Every day | ORAL | 6 refills | Status: AC

## 2020-12-03 NOTE — Discharge Summary (Signed)
Physician Discharge Summary  Megan Salinas ZOX:[id number] DOB: 1926-03-16 DOA: 11/28/2020  PCP: [name], MD  Admit date: 11/28/2020 Discharge date: 12/03/2020  Admitted From: Home Disposition:  SNF  Recommendations for Outpatient Follow-up:  Follow up with PCP in 1-2 weeks, question if metformin can be resumed as an outpatient. Please obtain BMP/CBC in one week   Home Health:No Equipment/Devices:none  Discharge Condition:Stable CODE STATUS:Full Diet recommendation: Heart Healthy   Brief/Interim Summary: 86 y.o. female past medical history significant for CAD status post CABG in 8119, chronic diastolic heart failure, essential hypertension diabetes mellitus type 2 right femoral fracture shaft 15 to 20 years ago left hip fracture status post intramedullary nailing in March 2021 now presents to the ED with severe right hip pain after fall, right knee x-ray was negative for acute findings as well as CT of the head, x-ray of the right hip showed left subcapital femoral neck fracture involving the right femoral diaphysis.  Discharge Diagnoses:  Principal Problem:   Closed right hip fracture, initial encounter [facility]) Active Problems:   Benign essential hypertension   Chronic diastolic CHF (congestive heart failure) (HCC)   Coronary artery disease   Type 2 diabetes mellitus without complication (HCC)   Lower limb ulcer, ankle, right, limited to breakdown of skin [facility])   Thyroid incidentaloma  Closed right hip fracture: Orthopedic surgery was consulted who performed surgical intervention on 11/29/2020. Her pain is just being controlled with Tylenol. She was started on a bowel regimen and she had multiple bowel movements. Physical therapy evaluated the patient and recommended skilled nursing facility. Orthopedic surgery recommended Lovenox for 28 and Robaxin orally.   CAD: Denies any exertional shortness of breath or chest pain, aspirin was held for procedure was resumed  has remained asymptomatic.  Chronic diastolic heart failure: No changes made to her medication regime.  Diabetes mellitus type 2: With an A1c of 6.8, she seems to be fairly well controlled with a diet we will follow-up with PCP in 6 weeks and see how her blood sugar has been doing, the patient relates she was recently taken off metformin question if it has to be resumed as an outpatient.  Essential hypertension: Resume home medications no changes made.  Right ankle ulcer: Follow-up by vascular as an outpatient no acute ischemia does not appear to be infected. ABIs were done which were followed up as an outpatient.  Incidental thyroid nodule: Follow-up with primary care as an outpatient at her age risk and benefits were discussed with the patient.    Discharge Instructions  Discharge Instructions     Diet - low sodium heart healthy   Complete by: As directed    Increase activity slowly   Complete by: As directed    No wound care   Complete by: As directed       Allergies as of 12/03/2020       Reactions   Dust Mite Extract Other (See Comments)   Runny nose   Sulfa Antibiotics Itching, Rash   Red, itchy palms        Medication List     STOP taking these medications    HYDROcodone-acetaminophen 5-325 MG tablet Commonly known as: NORCO/VICODIN       TAKE these medications    acetaminophen 500 MG tablet Commonly known as: TYLENOL Take 500 mg by mouth every 6 (six) hours as needed for mild pain.   amitriptyline 25 MG tablet Commonly known as: ELAVIL Take 25 mg by mouth at bedtime.  aspirin EC 81 MG tablet Take 81 mg by mouth daily.   docusate sodium 100 MG capsule Commonly known as: COLACE Take 1 capsule (100 mg total) by mouth 2 (two) times daily.   enoxaparin 40 MG/0.4ML injection Commonly known as: LOVENOX Inject 0.4 mLs (40 mg total) into the skin daily for 25 days.   famotidine 40 MG tablet Commonly known as: PEPCID Take 40 mg by mouth at  bedtime as needed for heartburn. What changed: Another medication with the same name was removed. Continue taking this medication, and follow the directions you see here.   lisinopril 20 MG tablet Commonly known as: ZESTRIL Take 20 mg by mouth daily.   methocarbamol 500 MG tablet Commonly known as: Robaxin Take 1 tablet (500 mg total) by mouth every 8 (eight) hours as needed for muscle spasms.   metoprolol tartrate 25 MG tablet Commonly known as: LOPRESSOR Take 12.5 mg by mouth 2 (two) times daily.   Multi-Vitamins Tabs Take 1 tablet by mouth daily.   nystatin cream Commonly known as: MYCOSTATIN 1 application daily as needed for dry skin.   pantoprazole 40 MG tablet Commonly known as: PROTONIX Take 1 tablet (40 mg total) by mouth daily.   polyethylene glycol 17 g packet Commonly known as: MIRALAX / GLYCOLAX Take 17 g by mouth daily.   psyllium 0.52 g capsule Commonly known as: REGULOID Take 1.04 g by mouth daily. 2 capsules daily   simvastatin 40 MG tablet Commonly known as: ZOCOR Take 40 mg by mouth daily.   spironolactone 25 MG tablet Commonly known as: ALDACTONE Take 12.5 mg by mouth daily.        Allergies  Allergen Reactions   Dust Mite Extract Other (See Comments)    Runny nose   Sulfa Antibiotics Itching and Rash    Red, itchy palms    Consultations: Orthopedic surgery   Procedures/Studies: DG Chest 1 View  Result Date: 11/28/2020 CLINICAL DATA:  Fall, right hip fracture, medical clearance EXAM: CHEST  1 VIEW COMPARISON:  04/10/2019 FINDINGS: Lungs are clear. No pneumothorax or pleural effusion. Coronary artery bypass grafting has been performed. Cardiac size is mildly enlarged, unchanged. Pulmonary vascularity is normal. No acute bone abnormality. IMPRESSION: No active disease.  Stable cardiomegaly. Electronically Signed   By: [name] M.D.   On: 11/28/2020 20:10   DG Knee 2 Views Right  Result Date: 11/28/2020 CLINICAL DATA:  Right  knee fracture, fall EXAM: RIGHT KNEE - 1-2 VIEW COMPARISON:  None. FINDINGS: Two view radiograph right knee demonstrates normal alignment. No acute fracture or dislocation. Healed distal femoral diaphyseal fracture status post ORIF in anatomic alignment. 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 Vascular calcifications noted within the posterior soft tissues. Surgical clips and soft tissue defect noted within the medial aspect of the visualized right foreleg. IMPRESSION: No acute fracture or dislocation. Electronically Signed   By: [name] M.D.   On: 11/28/2020 20:46   CT HEAD WO CONTRAST (5MM)  Result Date: 11/28/2020 CLINICAL DATA:  Fall EXAM: CT HEAD WITHOUT CONTRAST CT CERVICAL SPINE WITHOUT CONTRAST TECHNIQUE: Multidetector CT imaging of the head and cervical spine was performed following the standard protocol without intravenous contrast. Multiplanar CT image reconstructions of the cervical spine were also generated. COMPARISON:  CT head and cervical spine 04/10/2019 FINDINGS: CT HEAD FINDINGS Brain: Moderate generalized atrophy. Mild white matter hypodensity bilaterally. Negative for acute infarct, hemorrhage, or mass Vascular: Negative for hyperdense vessel  Skull: Negative Sinuses/Orbits: Mild mucosal edema paranasal sinuses. Left mastoid effusion. Right mastoid clear. Bilateral cataract extraction Other: None CT CERVICAL SPINE FINDINGS Alignment: Normal Skull base and vertebrae: Negative for fracture in the cervical spine. Soft tissues and spinal canal: Multiple thyroid nodules, difficult to measure on unenhanced CT. Right thyroid nodule approximately 15 mL. Cystic nodule in the isthmus measuring 17 mm. Atherosclerotic calcification of the carotid artery bilaterally. Disc levels: Advanced degenerative changes C1-2 with prominent pannus posterior to the dens. Moderate left foraminal narrowing C3-4 due to spurring. Moderate foraminal  narrowing bilaterally at C4-5 due to spurring with mild spinal stenosis Extensive spurring at C5-6 with moderate spinal stenosis and severe foraminal encroachment bilaterally. Probable cord compression. No change from the prior study. Moderate to severe spinal stenosis at C6-7 due to diffuse endplate spurring and probable calcified disc protrusion. No change from the prior CT. Moderate to severe foraminal encroachment bilaterally. Upper chest: Lung apices clear bilaterally Other: None IMPRESSION: 1. Atrophy and chronic microvascular ischemic change. No acute intracranial abnormality 2. Negative for cervical spine fracture 3. Advanced spondylosis. Moderate to severe spinal stenosis at C5-6 and C6-7. Multilevel foraminal encroachment bilaterally due to spurring. 4. Multiple thyroid nodules up to 17 mm diameter. (ref: J Am Coll Radiol. 2015 Feb;12(2): 143-50). Recommend thyroid ultrasound. Electronically Signed   By: Franchot Gallo M.D.   On: 11/28/2020 21:16   CT Cervical Spine Wo Contrast  Result Date: 11/28/2020 CLINICAL DATA:  Fall EXAM: CT HEAD WITHOUT CONTRAST CT CERVICAL SPINE WITHOUT CONTRAST TECHNIQUE: Multidetector CT imaging of the head and cervical spine was performed following the standard protocol without intravenous contrast. Multiplanar CT image reconstructions of the cervical spine were also generated. COMPARISON:  CT head and cervical spine 04/10/2019 FINDINGS: CT HEAD FINDINGS Brain: Moderate generalized atrophy. Mild white matter hypodensity bilaterally. Negative for acute infarct, hemorrhage, or mass Vascular: Negative for hyperdense vessel Skull: Negative Sinuses/Orbits: Mild mucosal edema paranasal sinuses. Left mastoid effusion. Right mastoid clear. Bilateral cataract extraction Other: None CT CERVICAL SPINE FINDINGS Alignment: Normal Skull base and vertebrae: Negative for fracture in the cervical spine. Soft tissues and spinal canal: Multiple thyroid nodules, difficult to measure on  unenhanced CT. Right thyroid nodule approximately 15 mL. Cystic nodule in the isthmus measuring 17 mm. Atherosclerotic calcification of the carotid artery bilaterally. Disc levels: Advanced degenerative changes C1-2 with prominent pannus posterior to the dens. Moderate left foraminal narrowing C3-4 due to spurring. Moderate foraminal narrowing bilaterally at C4-5 due to spurring with mild spinal stenosis Extensive spurring at C5-6 with moderate spinal stenosis and severe foraminal encroachment bilaterally. Probable cord compression. No change from the prior study. Moderate to severe spinal stenosis at C6-7 due to diffuse endplate spurring and probable calcified disc protrusion. No change from the prior CT. Moderate to severe foraminal encroachment bilaterally. Upper chest: Lung apices clear bilaterally Other: None IMPRESSION: 1. Atrophy and chronic microvascular ischemic change. No acute intracranial abnormality 2. Negative for cervical spine fracture 3. Advanced spondylosis. Moderate to severe spinal stenosis at C5-6 and C6-7. Multilevel foraminal encroachment bilaterally due to spurring. 4. Multiple thyroid nodules up to 17 mm diameter. (ref: J Am Coll Radiol. 2015 Feb;12(2): 143-50). Recommend thyroid ultrasound. Electronically Signed   By: Franchot Gallo M.D.   On: 11/28/2020 21:16   DG C-Arm 1-60 Min-No Report  Result Date: 11/29/2020 CLINICAL DATA:  Hardware removal EXAM: RIGHT FEMUR 2 VIEWS; DG C-ARM 1-60 MIN-NO REPORT COMPARISON:  11/28/2020 FINDINGS: A single C-arm fluoroscopic image was obtained intraoperatively and submitted  for post operative interpretation. Single image obtained after screw removal of multiple screws within the proximal aspect of the patient's right femoral fixation construct. 11 seconds fluoroscopy time was utilized. Please see the performing provider's procedural report for further detail. IMPRESSION: As above. Electronically Signed   By: Davina Poke D.O.   On: 11/29/2020  14:50   DG Hip Port Unilat With Pelvis 1V Right  Result Date: 11/29/2020 CLINICAL DATA:  Right hip arthroplasty EXAM: DG HIP (WITH OR WITHOUT PELVIS) 1V PORT RIGHT COMPARISON:  11/28/2020 FINDINGS: Interval postsurgical changes from right hip hemiarthroplasty. Arthroplasty components are in their expected alignment. No periprosthetic fracture or evidence of other complication. There is also been interval removal of the proximal screws related to patient's prior femoral fixation construct. Expected postoperative changes within the overlying soft tissues. Advanced atherosclerotic calcifications. IMPRESSION: Interval postsurgical changes from right hip hemiarthroplasty. Electronically Signed   By: Davina Poke D.O.   On: 11/29/2020 14:48   DG Hip Unilat W or Wo Pelvis 2-3 Views Right  Result Date: 11/28/2020 CLINICAL DATA:  Fall, right hip pain EXAM: DG HIP (WITH OR WITHOUT PELVIS) 2-3V RIGHT COMPARISON:  None. FINDINGS: Single view radiograph pelvis and two view radiograph right hip demonstrates an acute, displaced right subcapital femoral neck fracture withl roughly 1.5 cm superior displacement and external rotation of the distal fracture fragment. The right femoral head is still seated within the right acetabulum. Mild superimposed right hip degenerative arthritis. Right femoral ORIF has been performed with a partially visualized lateral plate and multiple cortical screws along the proximal right femoral diaphysis. Left hip ORIF has been performed. Pelvis appears intact. Degenerative changes are seen within the lumbar spine. Vascular calcifications are noted within the medial thighs and pelvis. IMPRESSION: Acute, displaced right subcapital femoral neck fracture. Pre-existing hardware partially visualized involving the proximal right femoral diaphysis. Electronically Signed   By: [name] M.D.   On: 11/28/2020 20:08   DG FEMUR, MIN 2 VIEWS RIGHT  Result Date: 11/29/2020 CLINICAL DATA:   Hardware removal EXAM: RIGHT FEMUR 2 VIEWS; DG C-ARM 1-60 MIN-NO REPORT COMPARISON:  11/28/2020 FINDINGS: A single C-arm fluoroscopic image was obtained intraoperatively and submitted for post operative interpretation. Single image obtained after screw removal of multiple screws within the proximal aspect of the patient's right femoral fixation construct. 11 seconds fluoroscopy time was utilized. Please see the performing provider's procedural report for further detail. IMPRESSION: As above. Electronically Signed   By: Davina Poke D.O.   On: 11/29/2020 14:50     Subjective: No complaints  Discharge Exam: Vitals:   12/02/20 1923 12/03/20 0359  BP: (!) 89/53 129/66  Pulse: 81 78  Resp: 18 17  Temp: 97.7 F (36.5 C) 97.9 F (36.6 C)  SpO2: 97% 99%   Vitals:   12/02/20 1255 12/02/20 1910 12/02/20 1923 12/03/20 0359  BP: 108/69  (!) 89/53 129/66  Pulse: 75  81 78  Resp: 18  18 17   Temp: 98.2 F (36.8 C)  97.7 F (36.5 C) 97.9 F (36.6 C)  TempSrc: Oral     SpO2: 96% 99% 97% 99%  Weight:      Height:        General: Pt is alert, awake, not in acute distress Cardiovascular: RRR, S1/S2 +, no rubs, no gallops Respiratory: CTA bilaterally, no wheezing, no rhonchi Abdominal: Soft, NT, ND, bowel sounds + Extremities: no edema, no cyanosis    The results of significant diagnostics from this hospitalization (including imaging, microbiology, ancillary and  laboratory) are listed below for reference.     Microbiology: Recent Results (from the past 240 hour(s))  Resp Panel by RT-PCR (Flu A&B, Covid) Nasopharyngeal Swab     Status: None   Collection Time: 11/28/20  8:13 PM   Specimen: Nasopharyngeal Swab; Nasopharyngeal(NP) swabs in vial transport medium  Result Value Ref Range Status   SARS Coronavirus 2 by RT PCR NEGATIVE NEGATIVE Final    Comment: (NOTE) SARS-CoV-2 target nucleic acids are NOT DETECTED.  The SARS-CoV-2 RNA is generally detectable in upper  respiratory specimens during the acute phase of infection. The lowest concentration of SARS-CoV-2 viral copies this assay can detect is 138 copies/mL. A negative result does not preclude SARS-Cov-2 infection and should not be used as the sole basis for treatment or other patient management decisions. A negative result may occur with  improper specimen collection/handling, submission of specimen other than nasopharyngeal swab, presence of viral mutation(s) within the areas targeted by this assay, and inadequate number of viral copies(<138 copies/mL). A negative result must be combined with clinical observations, patient history, and epidemiological information. The expected result is Negative.  Fact Sheet for Patients:  EntrepreneurPulse.com.au  Fact Sheet for Healthcare Providers:  IncredibleEmployment.be  This test is no t yet approved or cleared by the [place] FDA and  has been authorized for detection and/or diagnosis of SARS-CoV-2 by FDA under an Emergency Use Authorization (EUA). This EUA will remain  in effect (meaning this test can be used) for the duration of the COVID-19 declaration under Section 564(b)(1) of the Act, 21 U.S.C.section 360bbb-3(b)(1), unless the authorization is terminated  or revoked sooner.       Influenza A by PCR NEGATIVE NEGATIVE Final   Influenza B by PCR NEGATIVE NEGATIVE Final    Comment: (NOTE) The Xpert Xpress SARS-CoV-2/FLU/RSV plus assay is intended as an aid in the diagnosis of influenza from Nasopharyngeal swab specimens and should not be used as a sole basis for treatment. Nasal washings and aspirates are unacceptable for Xpert Xpress SARS-CoV-2/FLU/RSV testing.  Fact Sheet for Patients: EntrepreneurPulse.com.au  Fact Sheet for Healthcare Providers: IncredibleEmployment.be  This test is not yet approved or cleared by the [place] FDA and has been  authorized for detection and/or diagnosis of SARS-CoV-2 by FDA under an Emergency Use Authorization (EUA). This EUA will remain in effect (meaning this test can be used) for the duration of the COVID-19 declaration under Section 564(b)(1) of the Act, 21 U.S.C. section 360bbb-3(b)(1), unless the authorization is terminated or revoked.  Performed at [facility], [address] [place], [place] [address]   MRSA Next Gen by PCR, Nasal     Status: Abnormal   Collection Time: 11/29/20  2:37 AM   Specimen: Nasal Mucosa; Nasal Swab  Result Value Ref Range Status   MRSA by PCR Next Gen POSITIVE (A) NOT DETECTED Final    Comment: RESULT CALLED TO, READ BACK BY AND VERIFIED WITH: RN DAJA 11/29/20@4 :20 BY TW Performed at [facility] [address] [place], [place] [address]      Labs: BNP (last 3 results) No results for input(s): BNP in the last 8760 hours. Basic Metabolic Panel: Recent Labs  Lab 11/28/20 2013 11/29/20 0255 11/30/20 0350 12/01/20 0150  NA 136 133* 131* 128*  K 3.9 4.0 3.7 4.1  CL 106 103 104 100  CO2 23 21* 19* 20*  GLUCOSE 137* 165* 143* 184*  BUN 13 11 11 12   CREATININE 0.54 0.57 0.70 0.61  CALCIUM  9.0 9.0 8.6* 8.6*   Liver Function Tests: Recent Labs  Lab 11/28/20 2013  AST 31  ALT 26  ALKPHOS 80  BILITOT 1.6*  PROT 6.9  ALBUMIN 3.9   No results for input(s): LIPASE, AMYLASE in the last 168 hours. No results for input(s): AMMONIA in the last 168 hours. CBC: Recent Labs  Lab 11/28/20 2013 11/29/20 0255 11/30/20 0350 12/01/20 0150 12/02/20 0301  WBC 9.3 8.9 8.3 10.2 9.3  NEUTROABS 7.2  --   --   --   --   HGB 11.4* 10.5* 9.2* 8.9* 9.3*  HCT 34.2* 32.1* 27.9* 27.1* 27.0*  MCV 98.6 97.3 97.9 97.1 96.1  PLT 213 206 172 183 189   Cardiac Enzymes: No results for input(s): CKTOTAL, CKMB, CKMBINDEX, TROPONINI in the last 168 hours. BNP: Invalid input(s): POCBNP CBG: Recent Labs  Lab 12/02/20 1251 12/02/20 1620  12/02/20 1925 12/03/20 0004 12/03/20 0357  GLUCAP 206* 124* 183* 122* 120*   D-Dimer No results for input(s): DDIMER in the last 72 hours. Hgb A1c No results for input(s): HGBA1C in the last 72 hours. Lipid Profile No results for input(s): CHOL, HDL, LDLCALC, TRIG, CHOLHDL, LDLDIRECT in the last 72 hours. Thyroid function studies No results for input(s): TSH, T4TOTAL, T3FREE, THYROIDAB in the last 72 hours.  Invalid input(s): FREET3 Anemia work up No results for input(s): VITAMINB12, FOLATE, FERRITIN, TIBC, IRON, RETICCTPCT in the last 72 hours. Urinalysis    Component Value Date/Time   COLORURINE YELLOW (A) 05/06/2019 1155   APPEARANCEUR CLEAR (A) 05/06/2019 1155   APPEARANCEUR Clear 09/19/2013 1329   LABSPEC 1.013 05/06/2019 1155   LABSPEC 1.014 09/19/2013 1329   PHURINE 6.0 05/06/2019 1155   GLUCOSEU NEGATIVE 05/06/2019 1155   GLUCOSEU Negative 09/19/2013 1329   HGBUR NEGATIVE 05/06/2019 1155   BILIRUBINUR NEGATIVE 05/06/2019 1155   BILIRUBINUR Negative 09/19/2013 1329   KETONESUR NEGATIVE 05/06/2019 1155   PROTEINUR NEGATIVE 05/06/2019 1155   NITRITE NEGATIVE 05/06/2019 1155   LEUKOCYTESUR NEGATIVE 05/06/2019 1155   LEUKOCYTESUR Negative 09/19/2013 1329   Sepsis Labs Invalid input(s): PROCALCITONIN,  WBC,  LACTICIDVEN Microbiology Recent Results (from the past 240 hour(s))  Resp Panel by RT-PCR (Flu A&B, Covid) Nasopharyngeal Swab     Status: None   Collection Time: 11/28/20  8:13 PM   Specimen: Nasopharyngeal Swab; Nasopharyngeal(NP) swabs in vial transport medium  Result Value Ref Range Status   SARS Coronavirus 2 by RT PCR NEGATIVE NEGATIVE Final    Comment: (NOTE) SARS-CoV-2 target nucleic acids are NOT DETECTED.  The SARS-CoV-2 RNA is generally detectable in upper respiratory specimens during the acute phase of infection. The lowest concentration of SARS-CoV-2 viral copies this assay can detect is 138 copies/mL. A negative result does not preclude  SARS-Cov-2 infection and should not be used as the sole basis for treatment or other patient management decisions. A negative result may occur with  improper specimen collection/handling, submission of specimen other than nasopharyngeal swab, presence of viral mutation(s) within the areas targeted by this assay, and inadequate number of viral copies(<138 copies/mL). A negative result must be combined with clinical observations, patient history, and epidemiological information. The expected result is Negative.  Fact Sheet for Patients:  EntrepreneurPulse.com.au  Fact Sheet for Healthcare Providers:  IncredibleEmployment.be  This test is no t yet approved or cleared by the [place] FDA and  has been authorized for detection and/or diagnosis of SARS-CoV-2 by FDA under an Emergency Use Authorization (EUA). This EUA will remain  in effect (meaning  this test can be used) for the duration of the COVID-19 declaration under Section 564(b)(1) of the Act, 21 U.S.C.section 360bbb-3(b)(1), unless the authorization is terminated  or revoked sooner.       Influenza A by PCR NEGATIVE NEGATIVE Final   Influenza B by PCR NEGATIVE NEGATIVE Final    Comment: (NOTE) The Xpert Xpress SARS-CoV-2/FLU/RSV plus assay is intended as an aid in the diagnosis of influenza from Nasopharyngeal swab specimens and should not be used as a sole basis for treatment. Nasal washings and aspirates are unacceptable for Xpert Xpress SARS-CoV-2/FLU/RSV testing.  Fact Sheet for Patients: EntrepreneurPulse.com.au  Fact Sheet for Healthcare Providers: IncredibleEmployment.be  This test is not yet approved or cleared by the [place] FDA and has been authorized for detection and/or diagnosis of SARS-CoV-2 by FDA under an Emergency Use Authorization (EUA). This EUA will remain in effect (meaning this test can be used) for the duration of  the COVID-19 declaration under Section 564(b)(1) of the Act, 21 U.S.C. section 360bbb-3(b)(1), unless the authorization is terminated or revoked.  Performed at [facility], [address] [place], [place] [address]   MRSA Next Gen by PCR, Nasal     Status: Abnormal   Collection Time: 11/29/20  2:37 AM   Specimen: Nasal Mucosa; Nasal Swab  Result Value Ref Range Status   MRSA by PCR Next Gen POSITIVE (A) NOT DETECTED Final    Comment: RESULT CALLED TO, READ BACK BY AND VERIFIED WITH: RN DAJA 11/29/20@4 :20 BY TW Performed at [facility] [address], [place],  [address]      SIGNED:   Charlynne Cousins, MD  [facility] 12/03/2020, 7:58 AM Pager   If 7PM-7AM, please contact night-coverage [email or web address] Password [email or web address]

## 2020-12-03 NOTE — Care Management Important Message (Signed)
Important Message  Patient Details  Name: Megan Salinas MRN: [id number] Date of Birth: [birth year]   Medicare Important Message Given:  Yes     Joetta Manners 12/03/2020, 1:38 PM

## 2020-12-03 NOTE — Progress Notes (Addendum)
Physical Therapy Treatment Patient Details Name: Megan Salinas MRN: [id number] DOB: 07/31/1926 Today's Date: 12/03/2020   History of Present Illness 87 y.o. female admitted s/p fall with R femoral neck fracture . Underwent R hemiarthroplasty posterior) 11/17. Past medical history significant for CAD status post CABG in 3903, chronic diastolic heart failure, essential hypertension diabetes mellitus type 2 right femoral fracture shaft 15 to 20 years ago left hip fracture status post intramedullary nailing in March 2021.    PT Comments    Patient progressing slowly towards PT goals. Eager to get OOB and into chair. Requires Mod A of 2 to stand from EOB with cues and able to take a few steps to get to chair with min A of 2, assist for weight shifting and RW management. Pt fatigues pretty quickly after prolonged standing trials for pericare. Tolerated there ex.  Reviewed hip precautions as pt only able to recall 2/3. Continues to be appropriate for SNF. Will follow.    Recommendations for follow up therapy are one component of a multi-disciplinary discharge planning process, led by the attending physician.  Recommendations may be updated based on patient status, additional functional criteria and insurance authorization.  Follow Up Recommendations  Skilled nursing-short term rehab (<3 hours/day)     Assistance Recommended at Discharge Frequent or constant Supervision/Assistance  Equipment Recommendations       Recommendations for Other Services       Precautions / Restrictions Precautions Precautions: Fall;Posterior Hip Precaution Booklet Issued: Yes (comment) Required Braces or Orthoses: Other Brace Other Brace: abductor wedge Restrictions Weight Bearing Restrictions: Yes RLE Weight Bearing: Weight bearing as tolerated     Mobility  Bed Mobility Overal bed mobility: Needs Assistance Bed Mobility: Supine to Sit     Supine to sit: HOB elevated;Mod assist     General bed  mobility comments: assist with trunk and LEs to get to EOB; cues for sequencing and use of rail. + dizziness.    Transfers Overall transfer level: Needs assistance Equipment used: Rolling walker (2 wheels) Transfers: Sit to/from Stand;Bed to chair/wheelchair/BSC Sit to Stand: Mod assist;From elevated surface     Step pivot transfers: Min assist;+2 physical assistance     General transfer comment: mod A of 2 to power to standing with cues for hand placement/technique and use of momentum. Stood from EOB x2; flexed posture; able to take a few steps to get to chair with Min A of 2 anda ssist for weiht shifting.    Ambulation/Gait                   Stairs             Wheelchair Mobility    Modified Rankin (Stroke Patients Only)       Balance Overall balance assessment: History of Falls;Needs assistance Sitting-balance support: Feet supported;No upper extremity supported Sitting balance-Leahy Scale: Fair     Standing balance support: During functional activity;Reliant on assistive device for balance Standing balance-Leahy Scale: Poor Standing balance comment: Requires external support, flexed posture, decreased standing tolerance.                            Cognition Arousal/Alertness: Awake/alert Behavior During Therapy: WFL for tasks assessed/performed Overall Cognitive Status: No family/caregiver present to determine baseline cognitive functioning  General Comments: Requires repetition at times        Exercises General Exercises - Lower Extremity Ankle Circles/Pumps: AROM;Both;10 reps;Supine Long Arc Quad: AAROM;Right;Seated;10 reps    General Comments        Pertinent Vitals/Pain Pain Assessment: Faces Faces Pain Scale: Hurts little more Pain Location: R hip Pain Descriptors / Indicators: Sore;Operative site guarding Pain Intervention(s): Limited activity within patient's  tolerance;Monitored during session;Repositioned    Home Living                          Prior Function            PT Goals (current goals can now be found in the care plan section) Progress towards PT goals: Progressing toward goals (slowly)    Frequency    Min 3X/week      PT Plan Current plan remains appropriate    Co-evaluation              AM-PAC PT "6 Clicks" Mobility   Outcome Measure  Help needed turning from your back to your side while in a flat bed without using bedrails?: A Lot Help needed moving from lying on your back to sitting on the side of a flat bed without using bedrails?: A Lot Help needed moving to and from a bed to a chair (including a wheelchair)?: A Lot Help needed standing up from a chair using your arms (e.g., wheelchair or bedside chair)?: Total Help needed to walk in hospital room?: A Lot Help needed climbing 3-5 steps with a railing? : Total 6 Click Score: 10    End of Session Equipment Utilized During Treatment: Gait belt Activity Tolerance: Patient tolerated treatment well;Patient limited by fatigue Patient left: in chair;with call bell/phone within reach;with chair alarm set Nurse Communication: Mobility status PT Visit Diagnosis: Unsteadiness on feet (R26.81);Muscle weakness (generalized) (M62.81);History of falling (Z91.81);Difficulty in walking, not elsewhere classified (R26.2)     Time: 3570-1779 PT Time Calculation (min) (ACUTE ONLY): 27 min  Charges:  $Therapeutic Activity: 23-37 mins                     Marisa Severin, PT, DPT Acute Rehabilitation Services Pager [phone] Office [place] 12/03/2020, 3:32 PM

## 2020-12-03 NOTE — Discharge Instructions (Addendum)
Orthopaedic Discharge instructions   Orthopaedic Injury:  R femoral neck fracture treated with right hip hemiarthroplasty - R femoral neck fracture s/p R hip hemiarthroplasty              WBAT R leg             Posterior hip precautions             Dressing changes as needed starting now.               PT/OT daily. Ok to use exercise bike as long as hip precautions are followed              Ice prn pain and swelling   Minimize narcotics  -Vitamin D insufficiency/osteoporosis  Your fracture is suggestive of osteoporosis  Labs show vitamin D insufficiency  Take vitamin D3 5000 IUs daily and 1200 mg of calcium 2 times a day to help promote bone healing and bone health  Recommend bone density scan in the next 4 to 8 weeks    [place]   Discharge Wound Care Instructions  Do NOT apply any ointments, solutions or lotions to pin sites or surgical wounds.  These prevent needed drainage and even though solutions like hydrogen peroxide kill bacteria, they also damage cells lining the pin sites that help fight infection.  Applying lotions or ointments can keep the wounds moist and can cause them to breakdown and open up as well. This can increase the risk for infection. When in doubt call the office.  Surgical incisions should be dressed daily.  If any drainage is noted, use one layer of adaptic or Mepitel, then gauze, and tape. Alternatively you can use a mepilex (silicone foam) dressing   [email or web address] [email or web address]  TransferLive.be  These dressing supplies should be available at local medical supply stores ([employer], [facility], etc). They are not usually carried at places like [facility], [facility], [employer], etc  Once the incision is completely dry and without drainage,  it may be left open to air out.  Showering may begin 36-48 hours later.  Cleaning gently with soap and water.

## 2020-12-03 NOTE — TOC Transition Note (Signed)
Transition of Care [facility]) - CM/SW Discharge Note   Patient Details  Name: Megan Salinas MRN: [id number] Date of Birth: 1926-07-03  [facility] CM/SW Contact:  Joanne Chars, LCSW Phone Number: 12/03/2020, 1:38 PM   Clinical Narrative:   Pt discharging to [facility], room 355.  RN call [phone] for report.  PTAR called [phone].      Final next level of care: Skilled Nursing Facility Barriers to Discharge: Barriers Resolved   Patient Goals and CMS Choice Patient states their goals for this hospitalization and ongoing recovery are:: Pt was sleeping, unable to voice goals CMS Medicare.gov Compare Post Acute Care list provided to:: Patient Choice offered to / list presented to : Patient  Discharge Placement              Patient chooses bed at:  [facility]) Patient to be transferred to facility by: [place] Name of family member notified: daughter Lattie Haw Patient and family notified of of transfer: 12/03/20  Discharge Plan and Services                                     Social Determinants of Health (SDOH) Interventions     Readmission Risk Interventions No flowsheet data found.

## 2020-12-03 NOTE — Progress Notes (Signed)
Pt egress the unit with PTAR

## 2020-12-08 NOTE — Op Note (Signed)
11/29/2020  11:43 AM  PATIENT:  Megan Salinas  85-Feb-1928 female   MEDICAL RECORD NUMBER: [id number]  PRE-OPERATIVE DIAGNOSIS:   DISPLACED RIGHT FEMORAL NECK FRACTURE 2. RETAINED HARDWARE RIGHT FEMORAL PLATE  POST-OPERATIVE DIAGNOSIS:   DISPLACED RIGHT FEMORAL NECK FRACTURE 2. RETAINED HARDWARE RIGHT FEMORAL PLATE  PROCEDURE:  Procedure(s): UNIPOLAR HEMIARTHROPLASTY OF THE RIGHT HIP with [place] Basic #6 femoral stem, standard neck, mm head 2. REMOVAL OF DEEP IMPLANT RIGHT FEMUR  SURGEON:  Surgeon(s) and Role:    * Altamese Dearborn, MD - Primary  PHYSICIAN ASSISTANT: Ainsley Spinner, PA-C  ANESTHESIA:   general  EBL:  100 mL   BLOOD ADMINISTERED:none  DRAINS: none   LOCAL MEDICATIONS USED:  NONE  SPECIMEN:  No Specimen  DISPOSITION OF SPECIMEN:  N/A  COUNTS:  YES  TOURNIQUET:  * No tourniquets in log *  DICTATION: Note written in EPIC  PLAN OF CARE: Admit to inpatient   PATIENT DISPOSITION:  PACU - hemodynamically stable.   Delay start of Pharmacological VTE agent (>24hrs) due to surgical blood loss or risk of bleeding: no  BRIEF SUMMARY OF INDICATION FOR PROCEDURE:  Megan Salinas is a very pleasant 87 y.o. with multiple medical problems including heart disease, cerebrovascular disease, diabetes, who sustained an unwitnessed fall producing inability to bear weight, shortening, and external rotation of the extremity.  She was seen and evaluated with the recommendation for hemiarthroplasty that would also necessitate removal of multiple screws in her femoral plate. I discussed with the patient and family the risks and benefits, inclding the potential for leg length inequality, dislocation or instability, arthritis, loss of motion, DVT, PE, heart attack, stroke, and death.  Consent was given to proceed.  BRIEF SUMMARY OF PROCEDURE:  The patient was taken to the operating room where general anesthesia was induced and after administration of  preoperative antibiotics consisting of 2 g of Ancef.  She was positioned with the right side up and all prominences were padded appropriately.  We made a 10 cm incision after the time-out, carrying dissection down to the IT band, was split in line with the skin. I then had to make an additional 10cm of distal incision in order to access the screws for removal of her deep implant. I used C-arm to localize the screws and then removed them in stepwise fashion. There were no complications  and c-arm showed no evidence of occult nonunion. Cerebellar retractor was placed and we were able to then flex and internally rotate the hip releasing the piriformis and short rotators at their insertions.  The capsule was then T'd, tagging the corners with #1 Vicryl.  The neck cut was refined using a cutting guide and then this was followed by removal of the head, which sized perfectly. Acetabular trials were placed, confirming this size as the best fit. Mueller and Cobra retractors were placed along the proximal femur, which was then prepared with the canal finder,  then lateralizer, followed by reamers up to #6, and the broaches, achieving  outstanding fit and fill with the #6 broach.  The calcar reamer was used to refine the cut as we were using a low demand stem.  The canal was irrigated thoroughly and the acetabulum once again searched multiple times for fragments and irrigated thoroughly.  Trial components were placed and the patient had outstanding stability in combine 90 degrees of flexion, adduction, and internal rotation as well as in external rotation and extension.  Consequently, actual components were placed.  My  assistant Ainsley Spinner, was necessary for delivery and control of the proximal femur during preparation, also during relocation and dislocation of the trial components as well as relocation of the actual components.  He assisted me with wound closure as well.  I did repair the capsule with #1  Vicryl and then used #2 FiberWire through bone tunnels to repair the short rotators and piriformis.  This was followed by a #1 Vicryl for the IT band and lastly 2-0 Vicryl and nylon for the subcutaneous and skin.  Sterile gently compressive dressing was applied.  The patient was awakened from anesthesia and transported to the PACU in stable condition.  PROGNOSIS:  The patient will be weightbearing as tolerated with posterior hip precautions.  Patient has an elevated risk of complications related to declining overall health and mobility.  Megan Salinas remains on the Medical Service and will be on DVT prophylaxis mechanically and with Lovenox while in the hospital and will likely be transitioned to oral at discharge.     Astrid Divine. Marcelino Scot, M.D.

## 2020-12-21 ENCOUNTER — Emergency Department
Admission: EM | Admit: 2020-12-21 | Discharge: 2020-12-21 | Disposition: A | Discharge status: Home / Self Care | Payer: Medicare Other | Attending: Emergency Medicine | Admitting: Emergency Medicine

## 2020-12-21 ENCOUNTER — Other Ambulatory Visit: Payer: Self-pay

## 2020-12-21 ENCOUNTER — Emergency Department: Payer: Medicare Other

## 2020-12-21 DIAGNOSIS — E119 Type 2 diabetes mellitus without complications: Secondary | ICD-10-CM | POA: Diagnosis not present

## 2020-12-21 DIAGNOSIS — E871 Hypo-osmolality and hyponatremia: Secondary | ICD-10-CM | POA: Insufficient documentation

## 2020-12-21 DIAGNOSIS — I5032 Chronic diastolic (congestive) heart failure: Secondary | ICD-10-CM | POA: Insufficient documentation

## 2020-12-21 DIAGNOSIS — Z79899 Other long term (current) drug therapy: Secondary | ICD-10-CM | POA: Diagnosis not present

## 2020-12-21 DIAGNOSIS — Z96641 Presence of right artificial hip joint: Secondary | ICD-10-CM | POA: Insufficient documentation

## 2020-12-21 DIAGNOSIS — Z7984 Long term (current) use of oral hypoglycemic drugs: Secondary | ICD-10-CM | POA: Diagnosis not present

## 2020-12-21 DIAGNOSIS — Z7982 Long term (current) use of aspirin: Secondary | ICD-10-CM | POA: Insufficient documentation

## 2020-12-21 DIAGNOSIS — R339 Retention of urine, unspecified: Secondary | ICD-10-CM | POA: Diagnosis not present

## 2020-12-21 DIAGNOSIS — Z951 Presence of aortocoronary bypass graft: Secondary | ICD-10-CM | POA: Diagnosis not present

## 2020-12-21 DIAGNOSIS — R1084 Generalized abdominal pain: Secondary | ICD-10-CM | POA: Diagnosis not present

## 2020-12-21 DIAGNOSIS — I110 Hypertensive heart disease with heart failure: Secondary | ICD-10-CM | POA: Diagnosis not present

## 2020-12-21 DIAGNOSIS — I2510 Atherosclerotic heart disease of native coronary artery without angina pectoris: Secondary | ICD-10-CM | POA: Insufficient documentation

## 2020-12-21 DIAGNOSIS — Z87891 Personal history of nicotine dependence: Secondary | ICD-10-CM | POA: Insufficient documentation

## 2020-12-21 LAB — BASIC METABOLIC PANEL
Anion gap: 9 (ref 5–15)
BUN: 12 mg/dL (ref 8–23)
CO2: 26 mmol/L (ref 22–32)
Calcium: 9.2 mg/dL (ref 8.9–10.3)
Chloride: 95 mmol/L — ABNORMAL LOW (ref 98–111)
Creatinine, Ser: 0.65 mg/dL (ref 0.44–1.00)
GFR, Estimated: >60 mL/min (ref >60)
Glucose, Bld: 131 mg/dL — ABNORMAL HIGH (ref 70–99)
Potassium: 4.1 mmol/L (ref 3.5–5.1)
Sodium: 130 mmol/L — ABNORMAL LOW (ref 135–145)

## 2020-12-21 LAB — CBC
HCT: 29.5 % — ABNORMAL LOW (ref 36.0–46.0)
Hemoglobin: 9.8 g/dL — ABNORMAL LOW (ref 12.0–15.0)
MCH: 31.9 pg (ref 26.0–34.0)
MCHC: 33.2 g/dL (ref 30.0–36.0)
MCV: 96.1 fL (ref 80.0–100.0)
Platelets: 340 10*3/uL (ref 150–400)
RBC: 3.07 MIL/uL — ABNORMAL LOW (ref 3.87–5.11)
RDW: 14.4 % (ref 11.5–15.5)
WBC: 8.3 10*3/uL (ref 4.0–10.5)
nRBC: 0 % (ref 0.0–0.2)

## 2020-12-21 NOTE — ED Notes (Signed)
This RN contacted [facility] [place], d/c report given to [place]. Megan Salinas states pt will have to return to facility through ems due to recent hip fracture/surgery.

## 2020-12-21 NOTE — ED Triage Notes (Signed)
Pt arrived via ems from [facility]. NA+ of 122, three days ago. Right hip surgery a few days ago. Pt a&o on arrival. Denies any complaints at this time

## 2020-12-21 NOTE — ED Provider Notes (Addendum)
[facility] Emergency Department Provider Note   ____________________________________________    I have reviewed the triage vital signs and the nursing notes.   HISTORY  Chief Complaint Abnormal Lab     HPI Megan Salinas is a 87 y.o. female with a history of CAD, CHF, additional past medical history as detailed below who was sent from [facility] for evaluation for low sodium.  Reportedly level was 122, 3 days ago.  Review of medical records demonstrates patient had hip surgery nearly 1 month ago in our hospital.  Patient has no complaints and feels well.  Past Medical History:  Diagnosis Date   Back pain    CAD (coronary artery disease)    CHF (congestive heart failure) (HCC)    GERD (gastroesophageal reflux disease)    High cholesterol    Hypertension    TIA (transient ischemic attack) 01/2016   "possible", no deficits   Wears hearing aid    bilateral    Patient Active Problem List   Diagnosis Date Noted   Thyroid incidentaloma 11/29/2020   Closed right hip fracture, initial encounter ([place]) 11/28/2020   Lower limb ulcer, ankle, right, limited to breakdown of skin ([place]) 11/13/2020   Pain in limb 11/13/2020   Wears hearing aid in both ears 03/19/2020   Hemotympanum, left 10/04/2019   Sensorineural hearing loss of both ears 10/04/2019   Moderate aortic valve insufficiency 08/24/2019   Mild aortic stenosis 08/24/2019   Age-related osteoporosis with current pathological fracture with routine healing 04/18/2019   Displaced intertrochanteric fracture of left femur ([place]) 04/10/2019   Type 2 diabetes mellitus without complication ([place]) 73/22/0254   Preop examination 04/10/2019   Lymphedema 09/05/2018   Primary osteoarthritis of right wrist 02/22/2018   Chest pain 02/28/2017   Venous insufficiency of right lower extremity 09/02/2016   Swelling of limb 08/15/2016   Frequent PVCs 03/12/2016   Transient global amnesia 02/26/2016   TIA  (transient ischemic attack) 01/28/2016   Carpal tunnel syndrome, right 08/13/2015   DDD (degenerative disc disease), lumbar 09/12/2014   Lumbar radiculitis 09/12/2014   Lumbar stenosis with neurogenic claudication 09/12/2014   GERD (gastroesophageal reflux disease) 09/08/2014   History of type 2 diabetes mellitus 09/08/2014   Benign essential hypertension 06/16/2014   Moderate tricuspid insufficiency 06/07/2014   Abdominal pain, diffuse 03/09/2014   Chronic diastolic CHF (congestive heart failure) ([place]) 12/05/2013   Moderate mitral insufficiency 12/05/2013   Coronary artery disease 03/29/2013   Mixed hyperlipidemia 03/29/2013    Past Surgical History:  Procedure Laterality Date   ABDOMINAL HYSTERECTOMY  1998   APPENDECTOMY     bald[place] tac  1995   BREAST CYST ASPIRATION Right    CARPAL TUNNEL RELEASE Right 03/26/2016   Procedure: CARPAL TUNNEL RELEASE ENDOSCOPIC  right;  Surgeon: Corky Mull, MD;  Location: [place];  Service: Orthopedics;  Laterality: Right;  bier block   CHOLECYSTECTOMY     CORONARY ARTERY BYPASS GRAFT  02/15/2010   [facility], 3 vessel   HIP ARTHROPLASTY Right 11/29/2020   Procedure: ARTHROPLASTY BIPOLAR HIP (HEMIARTHROPLASTY) REMOVAL OF HARDWARE RIGHT FEMUR;  Surgeon: Altamese Rafael Gonzalez, MD;  Location: [place];  Service: Orthopedics;  Laterality: Right;   INTRAMEDULLARY (IM) NAIL INTERTROCHANTERIC Left 04/11/2019   Procedure: INTRAMEDULLARY (IM) NAIL INTERTROCHANTRIC;  Surgeon: Leim Fabry, MD;  Location: [facility];  Service: Orthopedics;  Laterality: Left;   TONSILLECTOMY      Prior to Admission medications   Medication Sig Start Date End Date  Taking? Authorizing Provider  acetaminophen (TYLENOL) 500 MG tablet Take 500 mg by mouth every 6 (six) hours as needed for mild pain.     [provider]  amitriptyline (ELAVIL) 25 MG tablet Take 25 mg by mouth at bedtime.  09/12/15   [provider]  aspirin EC 81 MG tablet Take 81 mg by mouth daily.      [provider]  calcium citrate (CALCITRATE - DOSED IN MG ELEMENTAL CALCIUM) 950 (200 Ca) MG tablet Take 1 tablet (200 mg of elemental calcium total) by mouth 2 (two) times daily. 12/03/20 01/02/21  Ainsley Spinner, PA-C  Cholecalciferol (VITAMIN D) 125 MCG (5000 UT) CAPS Take 1 capsule by mouth daily. 12/03/20   Ainsley Spinner, PA-C  docusate sodium (COLACE) 100 MG capsule Take 1 capsule (100 mg total) by mouth 2 (two) times daily. Patient not taking: Reported on 11/30/2020 04/13/19   Dhungel, Flonnie Overman, MD  enoxaparin (LOVENOX) 40 MG/0.4ML injection Inject 0.4 mLs (40 mg total) into the skin daily for 25 days. 12/03/20 12/28/20  Charlynne Cousins, MD  famotidine (PEPCID) 40 MG tablet Take 40 mg by mouth at bedtime as needed for heartburn. 11/16/20   [provider]  lisinopril (PRINIVIL,ZESTRIL) 20 MG tablet Take 20 mg by mouth daily.  05/19/16   [provider]  methocarbamol (ROBAXIN) 500 MG tablet Take 1 tablet (500 mg total) by mouth every 8 (eight) hours as needed for muscle spasms. Patient not taking: Reported on 11/30/2020 04/13/19   Dhungel, Flonnie Overman, MD  metoprolol tartrate (LOPRESSOR) 25 MG tablet Take 12.5 mg by mouth 2 (two) times daily. 06/26/15   [provider]  Multiple Vitamin (MULTI-VITAMINS) TABS Take 1 tablet by mouth daily.     [provider]  nystatin cream (MYCOSTATIN) 1 application daily as needed for dry skin. 09/10/15   [provider]  pantoprazole (PROTONIX) 40 MG tablet Take 1 tablet (40 mg total) by mouth daily. 04/13/19   Dhungel, Nishant, MD  polyethylene glycol (MIRALAX / GLYCOLAX) packet Take 17 g by mouth daily.     [provider]  psyllium (REGULOID) 0.52 g capsule Take 1.04 g by mouth daily. 2 capsules daily    [provider]  simvastatin (ZOCOR) 40 MG tablet Take 40 mg by mouth daily. 05/28/15   [provider]  spironolactone (ALDACTONE) 25 MG tablet Take 12.5 mg by mouth daily. 09/06/15    [provider]     Allergies Dust mite extract and Sulfa antibiotics  Family History  Problem Relation Age of Onset   Breast cancer Paternal Aunt        69's   Hypertension Mother     Social History Social History   Tobacco Use   Smoking status: Former   Smokeless tobacco: Never   Tobacco comments:    quit over 50 yrs ago  Substance Use Topics   Alcohol use: Yes    Alcohol/week: 5.0 standard drinks    Types: 5 Glasses of wine per week    Comment:     Drug use: No    Review of Systems  Constitutional: No fever/chills Eyes: No visual changes.  ENT: No sore throat. Cardiovascular: Denies chest pain. Respiratory: Denies shortness of breath. Gastrointestinal: No abdominal pain.  N Genitourinary: Negative for dysuria. Musculoskeletal: Negative for back pain. Skin: Negative for rash. Neurological: Negative for headaches or weakness   ____________________________________________   PHYSICAL EXAM:  VITAL SIGNS: ED Triage Vitals  Enc Vitals Group  BP 12/21/20 0828 (!) 140/58     Pulse Rate 12/21/20 0828 86     Resp 12/21/20 0828 15     Temp 12/21/20 0828 97.9 F (36.6 C)     Temp Source 12/21/20 0828 Oral     SpO2 12/21/20 0828 98 %     Weight 12/21/20 0827 70.3 kg (155 lb)     Height 12/21/20 0827 1.626 m (5\' 4" )     Head Circumference --      Peak Flow --      Pain Score 12/21/20 0827 0     Pain Loc --      Pain Edu? --      Excl. in [place]? --     Constitutional: Alert and oriented. No acute distress. Pleasant and interactive  Nose: No congestion/rhinnorhea. Mouth/Throat: Mucous membranes are moist.   Neck:  Painless ROM Cardiovascular: Normal rate, regular rhythm.  Good peripheral circulation. Respiratory: Normal respiratory effort.  No retractions.  Gastrointestinal: Soft and nontender.  Small umbilical hernia, easily reducible, no significant distention Musculoskeletal:   Warm and well perfused Neurologic:  Normal speech and language.   Skin:  Skin is warm, dry and intact. No rash noted. Psychiatric: Mood and affect are normal. Speech and behavior are normal.  ____________________________________________   LABS (all labs ordered are listed, but only abnormal results are displayed)  Labs Reviewed  CBC - Abnormal; Notable for the following components:      Result Value   RBC 3.07 (*)    Hemoglobin 9.8 (*)    HCT 29.5 (*)    All other components within normal limits  BASIC METABOLIC PANEL - Abnormal; Notable for the following components:   Sodium 130 (*)    Chloride 95 (*)    Glucose, Bld 131 (*)    All other components within normal limits   ____________________________________________  EKG  None ____________________________________________  RADIOLOGY   ____________________________________________   PROCEDURES  Procedure(s) performed: No  Procedures   Critical Care performed: No ____________________________________________   INITIAL IMPRESSION / ASSESSMENT AND PLAN / ED COURSE  Pertinent labs & imaging results that were available during my care of the patient were reviewed by me and considered in my medical decision making (see chart for details).   Labs obtained, patient sodium today is 130.  In reviewing prior records this seems to be in line with her baseline.    Patient with some swelling noted to abdomen prior to discharge, CT obtained which demonstrates significant urinary retention, question whether this may be from constipation.  Will place Foley catheter, recommend aggressive bowel regimen  No emergent treatment indicated, appropriate for discharge back to facility with outpatient follow-up with her PCP       ____________________________________________   FINAL CLINICAL IMPRESSION(S) / ED DIAGNOSES  Final diagnoses:  Hyponatremia  Urinary retention        Note:  This document was prepared using Dragon voice recognition software and may include unintentional dictation  errors.    Lavonia Drafts, MD 12/21/20 7989    Lavonia Drafts, MD 12/21/20 1004    Lavonia Drafts, MD 12/21/20 1049

## 2020-12-21 NOTE — Discharge Instructions (Addendum)
Patient's sodium level today is 130, which is in line with her prior levels  She was found to have severe urinary retention but with normal kidney labs.  Foley catheter was placed which drained over 2 L.  Please follow-up with urology  I recommend aggressive bowel regimen

## 2020-12-21 NOTE — ED Notes (Signed)
[facility]  CALLED  FOR  TO  [facility]  AT  [facility]

## 2021-01-02 ENCOUNTER — Other Ambulatory Visit: Payer: Self-pay

## 2021-01-02 ENCOUNTER — Ambulatory Visit (INDEPENDENT_AMBULATORY_CARE_PROVIDER_SITE_OTHER): Payer: Medicare Other | Admitting: Urology

## 2021-01-02 ENCOUNTER — Encounter: Payer: Self-pay | Admitting: Urology

## 2021-01-02 VITALS — BP 115/58 | HR 70 | Ht 64.0 in | Wt 145.0 lb

## 2021-01-02 DIAGNOSIS — N1330 Unspecified hydronephrosis: Secondary | ICD-10-CM | POA: Diagnosis not present

## 2021-01-02 DIAGNOSIS — R339 Retention of urine, unspecified: Secondary | ICD-10-CM

## 2021-01-02 NOTE — Progress Notes (Signed)
01/02/2021 8:23 AM   Shauna Hugh Guilford Shi 03-10-1926 [id number]  Referring provider: [name], MD [place],  [place] [address]  Chief Complaint  Patient presents with   Other    HPI: Megan Salinas is a 87 y.o. female who presents for follow-up of urinary retention.  She presents with a caregiver from [facility] ED visit 12/21/2020 for evaluation of hyponatremia Was also complaining of mild left lower quadrant abdominal pain and CT was performed which showed bladder distention with bilateral hydronephrosis/hydroureter to the UVJ. Creatinine was normal Foley catheter was placed with 2 L urine output CT abdomen/pelvis 2020 showed no hydronephrosis or bladder distention She underwent right hemiarthroplasty 11/29/2020 for hip fracture and after discharge has noted voiding problems   PMH: Past Medical History:  Diagnosis Date   Back pain    CAD (coronary artery disease)    CHF (congestive heart failure) (HCC)    GERD (gastroesophageal reflux disease)    High cholesterol    Hypertension    TIA (transient ischemic attack) 01/2016   "possible", no deficits   Wears hearing aid    bilateral    Surgical History: Past Surgical History:  Procedure Laterality Date   ABDOMINAL HYSTERECTOMY  1998   APPENDECTOMY     baldder tac  1995   BREAST CYST ASPIRATION Right    CARPAL TUNNEL RELEASE Right 03/26/2016   Procedure: CARPAL TUNNEL RELEASE ENDOSCOPIC  right;  Surgeon: Corky Mull, MD;  Location: [facility];  Service: Orthopedics;  Laterality: Right;  bier block   CHOLECYSTECTOMY     CORONARY ARTERY BYPASS GRAFT  02/15/2010   [facility], 3 vessel   HIP ARTHROPLASTY Right 11/29/2020   Procedure: ARTHROPLASTY BIPOLAR HIP (HEMIARTHROPLASTY) REMOVAL OF HARDWARE RIGHT FEMUR;  Surgeon: Altamese Romoland, MD;  Location: [facility];  Service: Orthopedics;  Laterality: Right;   INTRAMEDULLARY (IM) NAIL INTERTROCHANTERIC Left 04/11/2019   Procedure: INTRAMEDULLARY (IM)  NAIL INTERTROCHANTRIC;  Surgeon: Leim Fabry, MD;  Location: [facility];  Service: Orthopedics;  Laterality: Left;   TONSILLECTOMY      Home Medications:  Allergies as of 01/02/2021       Reactions   Dust Mite Extract Other (See Comments)   Runny nose   Sulfa Antibiotics Itching, Rash   Red, itchy palms        Medication List        Accurate as of January 02, 2021  8:23 AM. If you have any questions, ask your nurse or doctor.          acetaminophen 500 MG tablet Commonly known as: TYLENOL Take 500 mg by mouth every 6 (six) hours as needed for mild pain.   amitriptyline 25 MG tablet Commonly known as: ELAVIL Take 25 mg by mouth at bedtime.   aspirin EC 81 MG tablet Take 81 mg by mouth daily.   calcium citrate 950 (200 Ca) MG tablet Commonly known as: CALCITRATE - dosed in mg elemental calcium Take 1 tablet (200 mg of elemental calcium total) by mouth 2 (two) times daily.   docusate sodium 100 MG capsule Commonly known as: COLACE Take 1 capsule (100 mg total) by mouth 2 (two) times daily.   enoxaparin 40 MG/0.4ML injection Commonly known as: LOVENOX Inject 0.4 mLs (40 mg total) into the skin daily for 25 days.   famotidine 40 MG tablet Commonly known as: PEPCID Take 40 mg by mouth at bedtime as needed for heartburn.   lisinopril 20 MG tablet Commonly  known as: ZESTRIL Take 20 mg by mouth daily.   methocarbamol 500 MG tablet Commonly known as: Robaxin Take 1 tablet (500 mg total) by mouth every 8 (eight) hours as needed for muscle spasms.   metoprolol tartrate 25 MG tablet Commonly known as: LOPRESSOR Take 12.5 mg by mouth 2 (two) times daily.   Multi-Vitamins Tabs Take 1 tablet by mouth daily.   nystatin cream Commonly known as: MYCOSTATIN 1 application daily as needed for dry skin.   pantoprazole 40 MG tablet Commonly known as: PROTONIX Take 1 tablet (40 mg total) by mouth daily.   polyethylene glycol 17 g packet Commonly known as: MIRALAX /  GLYCOLAX Take 17 g by mouth daily.   psyllium 0.52 g capsule Commonly known as: REGULOID Take 1.04 g by mouth daily. 2 capsules daily   simvastatin 40 MG tablet Commonly known as: ZOCOR Take 40 mg by mouth daily.   spironolactone 25 MG tablet Commonly known as: ALDACTONE Take 12.5 mg by mouth daily.   Vitamin D 125 MCG (5000 UT) Caps Take 1 capsule by mouth daily.        Allergies:  Allergies  Allergen Reactions   Dust Mite Extract Other (See Comments)    Runny nose   Sulfa Antibiotics Itching and Rash    Red, itchy palms    Family History: Family History  Problem Relation Age of Onset   Breast cancer Paternal Aunt        70's   Hypertension Mother     Social History:  reports that she has quit smoking. She has never used smokeless tobacco. She reports current alcohol use of about 5.0 standard drinks per week. She reports that she does not use drugs.   Physical Exam: BP (!) 115/58    Pulse 70    Ht 5\' 4"  (1.626 m)    Wt 145 lb (65.8 kg)    BMI 24.89 kg/m   Constitutional:  Alert, no acute distress. HEENT: [place] AT, moist mucus membranes.  Trachea midline, no masses. Cardiovascular: No clubbing, cyanosis, or edema. Respiratory: Normal respiratory effort, no increased work of breathing.   Assessment & Plan:    1.  Urinary retention Indwelling catheter x12 days Catheter removed for voiding trial [facility] does have a bladder scan and an order was written to check bladder scan today and replace Foley for residual >500 mL Will schedule follow-up renal ultrasound with PA follow-up in 1 month for review and repeat bladder scan  2. Bilateral hydronephrosis   Abbie Sons, MD  [place] [address] [place], [place] [address] [phone]

## 2021-01-02 NOTE — Progress Notes (Signed)
Catheter Removal  Patient is present today for a catheter removal.  30ml of water was drained from the balloon. A 16FR foley cath was removed from the bladder no complications were noted . Patient tolerated well.  Performed by: Elberta Leatherwood, [place]  Follow up/ Additional notes: afternoon PVR at [facility].

## 2021-01-03 ENCOUNTER — Ambulatory Visit: Payer: Medicare Other | Admitting: Physician Assistant

## 2021-01-08 ENCOUNTER — Other Ambulatory Visit: Payer: Self-pay

## 2021-01-08 ENCOUNTER — Inpatient Hospital Stay
Admission: EM | Admit: 2021-01-08 | Discharge: 2021-01-10 | DRG: 392 | Disposition: A | Discharge status: Skilled Nursing Facility | Payer: Medicare Other | Source: Skilled Nursing Facility | Attending: Internal Medicine | Admitting: Internal Medicine

## 2021-01-08 ENCOUNTER — Emergency Department: Payer: Medicare Other

## 2021-01-08 DIAGNOSIS — Z974 Presence of external hearing-aid: Secondary | ICD-10-CM | POA: Diagnosis not present

## 2021-01-08 DIAGNOSIS — I5032 Chronic diastolic (congestive) heart failure: Secondary | ICD-10-CM | POA: Diagnosis present

## 2021-01-08 DIAGNOSIS — Z978 Presence of other specified devices: Secondary | ICD-10-CM | POA: Diagnosis not present

## 2021-01-08 DIAGNOSIS — I493 Ventricular premature depolarization: Secondary | ICD-10-CM | POA: Diagnosis present

## 2021-01-08 DIAGNOSIS — E7800 Pure hypercholesterolemia, unspecified: Secondary | ICD-10-CM | POA: Diagnosis present

## 2021-01-08 DIAGNOSIS — D72828 Other elevated white blood cell count: Secondary | ICD-10-CM

## 2021-01-08 DIAGNOSIS — I110 Hypertensive heart disease with heart failure: Secondary | ICD-10-CM | POA: Diagnosis present

## 2021-01-08 DIAGNOSIS — R109 Unspecified abdominal pain: Secondary | ICD-10-CM | POA: Diagnosis present

## 2021-01-08 DIAGNOSIS — Z9109 Other allergy status, other than to drugs and biological substances: Secondary | ICD-10-CM | POA: Diagnosis not present

## 2021-01-08 DIAGNOSIS — Z882 Allergy status to sulfonamides status: Secondary | ICD-10-CM

## 2021-01-08 DIAGNOSIS — I10 Essential (primary) hypertension: Secondary | ICD-10-CM | POA: Diagnosis not present

## 2021-01-08 DIAGNOSIS — K5900 Constipation, unspecified: Secondary | ICD-10-CM | POA: Diagnosis present

## 2021-01-08 DIAGNOSIS — Z960 Presence of urogenital implants: Secondary | ICD-10-CM | POA: Diagnosis present

## 2021-01-08 DIAGNOSIS — E860 Dehydration: Secondary | ICD-10-CM | POA: Diagnosis present

## 2021-01-08 DIAGNOSIS — K219 Gastro-esophageal reflux disease without esophagitis: Secondary | ICD-10-CM | POA: Diagnosis present

## 2021-01-08 DIAGNOSIS — Z96641 Presence of right artificial hip joint: Secondary | ICD-10-CM | POA: Diagnosis present

## 2021-01-08 DIAGNOSIS — N136 Pyonephrosis: Secondary | ICD-10-CM | POA: Diagnosis present

## 2021-01-08 DIAGNOSIS — R1084 Generalized abdominal pain: Secondary | ICD-10-CM

## 2021-01-08 DIAGNOSIS — Z8249 Family history of ischemic heart disease and other diseases of the circulatory system: Secondary | ICD-10-CM | POA: Diagnosis not present

## 2021-01-08 DIAGNOSIS — Z7982 Long term (current) use of aspirin: Secondary | ICD-10-CM | POA: Diagnosis not present

## 2021-01-08 DIAGNOSIS — E119 Type 2 diabetes mellitus without complications: Secondary | ICD-10-CM | POA: Diagnosis present

## 2021-01-08 DIAGNOSIS — I2510 Atherosclerotic heart disease of native coronary artery without angina pectoris: Secondary | ICD-10-CM | POA: Diagnosis present

## 2021-01-08 DIAGNOSIS — K529 Noninfective gastroenteritis and colitis, unspecified: Secondary | ICD-10-CM | POA: Diagnosis present

## 2021-01-08 DIAGNOSIS — N3000 Acute cystitis without hematuria: Secondary | ICD-10-CM

## 2021-01-08 DIAGNOSIS — Z803 Family history of malignant neoplasm of breast: Secondary | ICD-10-CM

## 2021-01-08 DIAGNOSIS — Z79899 Other long term (current) drug therapy: Secondary | ICD-10-CM | POA: Diagnosis not present

## 2021-01-08 DIAGNOSIS — Z20822 Contact with and (suspected) exposure to covid-19: Secondary | ICD-10-CM | POA: Diagnosis present

## 2021-01-08 DIAGNOSIS — A09 Infectious gastroenteritis and colitis, unspecified: Secondary | ICD-10-CM | POA: Diagnosis not present

## 2021-01-08 DIAGNOSIS — Z8673 Personal history of transient ischemic attack (TIA), and cerebral infarction without residual deficits: Secondary | ICD-10-CM | POA: Diagnosis not present

## 2021-01-08 LAB — BASIC METABOLIC PANEL
Anion gap: 9 (ref 5–15)
BUN: 22 mg/dL (ref 8–23)
CO2: 21 mmol/L — ABNORMAL LOW (ref 22–32)
Calcium: 9.3 mg/dL (ref 8.9–10.3)
Chloride: 96 mmol/L — ABNORMAL LOW (ref 98–111)
Creatinine, Ser: 0.92 mg/dL (ref 0.44–1.00)
GFR, Estimated: 58 mL/min — ABNORMAL LOW (ref >60)
Glucose, Bld: 207 mg/dL — ABNORMAL HIGH (ref 70–99)
Potassium: 4.4 mmol/L (ref 3.5–5.1)
Sodium: 126 mmol/L — ABNORMAL LOW (ref 135–145)

## 2021-01-08 LAB — URINALYSIS, COMPLETE (UACMP) WITH MICROSCOPIC
Bilirubin Urine: NEGATIVE
Glucose, UA: NEGATIVE mg/dL
Ketones, ur: NEGATIVE mg/dL
Nitrite: NEGATIVE
Protein, ur: NEGATIVE mg/dL
Specific Gravity, Urine: 1.017 (ref 1.005–1.030)
pH: 5 (ref 5.0–8.0)

## 2021-01-08 LAB — HEPATIC FUNCTION PANEL
ALT: 11 U/L (ref 0–44)
AST: 22 U/L (ref 15–41)
Albumin: 3.2 g/dL — ABNORMAL LOW (ref 3.5–5.0)
Alkaline Phosphatase: 104 U/L (ref 38–126)
Bilirubin, Direct: 0.3 mg/dL — ABNORMAL HIGH (ref 0.0–0.2)
Indirect Bilirubin: 0.9 mg/dL (ref 0.3–0.9)
Total Bilirubin: 1.2 mg/dL (ref 0.3–1.2)
Total Protein: 6.5 g/dL (ref 6.5–8.1)

## 2021-01-08 LAB — CBC
HCT: 32.6 % — ABNORMAL LOW (ref 36.0–46.0)
Hemoglobin: 10.9 g/dL — ABNORMAL LOW (ref 12.0–15.0)
MCH: 31.2 pg (ref 26.0–34.0)
MCHC: 33.4 g/dL (ref 30.0–36.0)
MCV: 93.4 fL (ref 80.0–100.0)
Platelets: 411 10*3/uL — ABNORMAL HIGH (ref 150–400)
RBC: 3.49 MIL/uL — ABNORMAL LOW (ref 3.87–5.11)
RDW: 15.4 % (ref 11.5–15.5)
WBC: 34 10*3/uL — ABNORMAL HIGH (ref 4.0–10.5)
nRBC: 0.1 % (ref 0.0–0.2)

## 2021-01-08 LAB — RESP PANEL BY RT-PCR (FLU A&B, COVID) ARPGX2
Influenza A by PCR: NEGATIVE
Influenza B by PCR: NEGATIVE
SARS Coronavirus 2 by RT PCR: NEGATIVE

## 2021-01-08 LAB — LACTIC ACID, PLASMA: Lactic Acid, Venous: 1.8 mmol/L (ref 0.5–1.9)

## 2021-01-08 LAB — LIPASE, BLOOD: Lipase: 21 U/L (ref 11–51)

## 2021-01-08 MED ORDER — INSULIN ASPART 100 UNIT/ML IJ SOLN
0.0000 [IU] | Freq: Three times a day (TID) | INTRAMUSCULAR | Status: DC
  Filled 2021-01-08: qty 1

## 2021-01-08 MED ORDER — METOPROLOL TARTRATE 25 MG PO TABS
12.5000 mg | ORAL_TABLET | Freq: Two times a day (BID) | ORAL | Status: DC
  Administered 2021-01-09 – 2021-01-10 (×4): 12.5 mg via ORAL
  Filled 2021-01-08 (×4): qty 1

## 2021-01-08 MED ORDER — SODIUM CHLORIDE 0.9 % IV SOLN: INTRAVENOUS | Status: AC

## 2021-01-08 MED ORDER — HEPARIN SODIUM (PORCINE) 5000 UNIT/ML IJ SOLN
5000.0000 [IU] | Freq: Three times a day (TID) | INTRAMUSCULAR | Status: DC
  Administered 2021-01-09 – 2021-01-10 (×5): 5000 [IU] via SUBCUTANEOUS
  Filled 2021-01-08 (×5): qty 1

## 2021-01-08 MED ORDER — SIMVASTATIN 40 MG PO TABS
40.0000 mg | ORAL_TABLET | Freq: Every day | ORAL | Status: DC
  Administered 2021-01-09: 22:00:00 40 mg via ORAL
  Filled 2021-01-08 (×2): qty 1

## 2021-01-08 MED ORDER — ACETAMINOPHEN 650 MG RE SUPP: 650.0000 mg | Freq: Four times a day (QID) | RECTAL | Status: DC | PRN

## 2021-01-08 MED ORDER — SPIRONOLACTONE 25 MG PO TABS
12.5000 mg | ORAL_TABLET | Freq: Every day | ORAL | Status: DC
  Administered 2021-01-09 – 2021-01-10 (×2): 12.5 mg via ORAL
  Filled 2021-01-08: qty 0.5
  Filled 2021-01-08 (×2): qty 1
  Filled 2021-01-08: qty 0.5

## 2021-01-08 MED ORDER — ALUM & MAG HYDROXIDE-SIMETH 200-200-20 MG/5ML PO SUSP: 30.0000 mL | ORAL | Status: DC | PRN

## 2021-01-08 MED ORDER — ASPIRIN EC 81 MG PO TBEC
81.0000 mg | DELAYED_RELEASE_TABLET | Freq: Every day | ORAL | Status: DC
  Administered 2021-01-09 – 2021-01-10 (×2): 81 mg via ORAL
  Filled 2021-01-08 (×2): qty 1

## 2021-01-08 MED ORDER — PANTOPRAZOLE SODIUM 40 MG PO TBEC
40.0000 mg | DELAYED_RELEASE_TABLET | Freq: Every day | ORAL | Status: DC
  Administered 2021-01-09 – 2021-01-10 (×2): 40 mg via ORAL
  Filled 2021-01-08 (×2): qty 1

## 2021-01-08 MED ORDER — METRONIDAZOLE 500 MG/100ML IV SOLN
500.0000 mg | Freq: Once | INTRAVENOUS | Status: AC
  Administered 2021-01-08: 22:00:00 500 mg via INTRAVENOUS
  Filled 2021-01-08: qty 100

## 2021-01-08 MED ORDER — LISINOPRIL 20 MG PO TABS
20.0000 mg | ORAL_TABLET | Freq: Every day | ORAL | Status: DC
  Administered 2021-01-09 – 2021-01-10 (×2): 20 mg via ORAL
  Filled 2021-01-08: qty 2
  Filled 2021-01-08: qty 1

## 2021-01-08 MED ORDER — AMITRIPTYLINE HCL 25 MG PO TABS
25.0000 mg | ORAL_TABLET | Freq: Every day | ORAL | Status: DC
  Administered 2021-01-09 (×2): 25 mg via ORAL
  Filled 2021-01-08 (×3): qty 1

## 2021-01-08 MED ORDER — SODIUM CHLORIDE 0.9 % IV SOLN
2.0000 g | Freq: Once | INTRAVENOUS | Status: AC
  Administered 2021-01-09: 2 g via INTRAVENOUS
  Filled 2021-01-08: qty 2

## 2021-01-08 MED ORDER — LACTATED RINGERS IV BOLUS
1000.0000 mL | Freq: Once | INTRAVENOUS | Status: AC
  Administered 2021-01-08: 21:00:00 1000 mL via INTRAVENOUS

## 2021-01-08 MED ORDER — METRONIDAZOLE 500 MG/100ML IV SOLN
500.0000 mg | Freq: Two times a day (BID) | INTRAVENOUS | Status: DC
  Administered 2021-01-09 – 2021-01-10 (×3): 500 mg via INTRAVENOUS
  Filled 2021-01-08 (×4): qty 100

## 2021-01-08 MED ORDER — IOHEXOL 300 MG/ML  SOLN
100.0000 mL | Freq: Once | INTRAMUSCULAR | Status: AC | PRN
  Administered 2021-01-08: 21:00:00 100 mL via INTRAVENOUS

## 2021-01-08 MED ORDER — ACETAMINOPHEN 325 MG PO TABS: 650.0000 mg | ORAL_TABLET | Freq: Four times a day (QID) | ORAL | Status: DC | PRN

## 2021-01-08 MED ORDER — HYDRALAZINE HCL 20 MG/ML IJ SOLN: 5.0000 mg | INTRAMUSCULAR | Status: DC | PRN

## 2021-01-08 NOTE — ED Provider Notes (Addendum)
[facility] Emergency Department Provider Note  ____________________________________________   Event Date/Time   First MD Initiated Contact with Patient 01/08/21 2018     (approximate)  I have reviewed the triage vital signs and the nursing notes.   HISTORY  Chief Complaint GI Bleeding    HPI Megan Salinas is a 87 y.o. female with extensive past medical history including CHF, CAD, here with diffuse abdominal pain, nausea, diarrhea, and now constipation.  The patient states that for the last several days to week, she is epigastric worsening aching, throbbing, severe, primarily lower abdominal pain.  She currently has a Foley catheter in due to urinary retention after recent hip replacement, and has had some darkened urine as well.  She was recently on antibiotics for this.  She lives in a nursing facility.  She states the pain seems to come and go randomly.  Does not necessarily worsen with eating or movement.  She is had small spots of blood in her stool.  She did have some loose stool, though now she is more so constipated.  She has not wanted to eat or drink and has had very little to eat over the last 48 hours.    Past Medical History:  Diagnosis Date   Back pain    CAD (coronary artery disease)    CHF (congestive heart failure) (HCC)    GERD (gastroesophageal reflux disease)    High cholesterol    Hypertension    TIA (transient ischemic attack) 01/2016   "possible", no deficits   Wears hearing aid    bilateral    Patient Active Problem List   Diagnosis Date Noted   Colitis 01/08/2021   Thyroid incidentaloma 11/29/2020   Closed right hip fracture, initial encounter ([place]) 11/28/2020   Lower limb ulcer, ankle, right, limited to breakdown of skin ([place]) 11/13/2020   Pain in limb 11/13/2020   Wears hearing aid in both ears 03/19/2020   Hemotympanum, left 10/04/2019   Sensorineural hearing loss of both ears 10/04/2019   Moderate aortic valve  insufficiency 08/24/2019   Mild aortic stenosis 08/24/2019   Age-related osteoporosis with current pathological fracture with routine healing 04/18/2019   Displaced intertrochanteric fracture of left femur ([place]) 04/10/2019   Type 2 diabetes mellitus without complication ([place]) 73/41/9379   Preop examination 04/10/2019   Lymphedema 09/05/2018   Primary osteoarthritis of right wrist 02/22/2018   Chest pain 02/28/2017   Venous insufficiency of right lower extremity 09/02/2016   Swelling of limb 08/15/2016   Frequent PVCs 03/12/2016   Transient global amnesia 02/26/2016   TIA (transient ischemic attack) 01/28/2016   Carpal tunnel syndrome, right 08/13/2015   DDD (degenerative disc disease), lumbar 09/12/2014   Lumbar radiculitis 09/12/2014   Lumbar stenosis with neurogenic claudication 09/12/2014   GERD (gastroesophageal reflux disease) 09/08/2014   History of type 2 diabetes mellitus 09/08/2014   Benign essential hypertension 06/16/2014   Moderate tricuspid insufficiency 06/07/2014   Abdominal pain, diffuse 03/09/2014   Chronic diastolic CHF (congestive heart failure) ([place]) 12/05/2013   Moderate mitral insufficiency 12/05/2013   Coronary artery disease 03/29/2013   Mixed hyperlipidemia 03/29/2013    Past Surgical History:  Procedure Laterality Date   ABDOMINAL HYSTERECTOMY  1998   APPENDECTOMY     baldder tac  1995   BREAST CYST ASPIRATION Right    CARPAL TUNNEL RELEASE Right 03/26/2016   Procedure: CARPAL TUNNEL RELEASE ENDOSCOPIC  right;  Surgeon: Corky Mull, MD;  Location: [facility];  Service: Orthopedics;  Laterality: Right;  bier block   CHOLECYSTECTOMY     CORONARY ARTERY BYPASS GRAFT  02/15/2010   [facility], 3 vessel   HIP ARTHROPLASTY Right 11/29/2020   Procedure: ARTHROPLASTY BIPOLAR HIP (HEMIARTHROPLASTY) REMOVAL OF HARDWARE RIGHT FEMUR;  Surgeon: Altamese [place], MD;  Location: [facility];  Service: Orthopedics;  Laterality: Right;   INTRAMEDULLARY (IM) NAIL  INTERTROCHANTERIC Left 04/11/2019   Procedure: INTRAMEDULLARY (IM) NAIL INTERTROCHANTRIC;  Surgeon: Leim Fabry, MD;  Location: [facility];  Service: Orthopedics;  Laterality: Left;   TONSILLECTOMY      Prior to Admission medications   Medication Sig Start Date End Date Taking? Authorizing Provider  acetaminophen (TYLENOL) 500 MG tablet Take 500 mg by mouth every 6 (six) hours as needed for mild pain.    Yes [provider]  alum & mag hydroxide-simeth (MAALOX/MYLANTA) 200-200-20 MG/5ML suspension Take 30 mLs by mouth every 4 (four) hours as needed for indigestion or heartburn.   Yes [provider]  amitriptyline (ELAVIL) 25 MG tablet Take 25 mg by mouth at bedtime.  09/12/15  Yes [provider]  aspirin EC 81 MG tablet Take 81 mg by mouth daily.    Yes [provider]  bisacodyl (DULCOLAX) 10 MG suppository Place 10 mg rectally as needed for moderate constipation.   Yes [provider]  calcium citrate (CALCITRATE - DOSED IN MG ELEMENTAL CALCIUM) 950 (200 Ca) MG tablet Take 1 tablet (200 mg of elemental calcium total) by mouth 2 (two) times daily. 12/03/20 01/08/21 Yes Ainsley Spinner, PA-C  Cholecalciferol (VITAMIN D) 125 MCG (5000 UT) CAPS Take 1 capsule by mouth daily. 12/03/20  Yes Ainsley Spinner, PA-C  dicyclomine (BENTYL) 20 MG tablet Take 20 mg by mouth every 6 (six) hours.   Yes [provider]  docusate sodium (COLACE) 100 MG capsule Take 1 capsule (100 mg total) by mouth 2 (two) times daily. 04/13/19  Yes Dhungel, Nishant, MD  famotidine (PEPCID) 40 MG tablet Take 40 mg by mouth at bedtime as needed for heartburn. 11/16/20  Yes [provider]  [facility]) OINT Apply 1 application topically daily as needed.   Yes [provider]  lisinopril (PRINIVIL,ZESTRIL) 20 MG tablet Take 20 mg by mouth daily.  05/19/16  Yes [provider]  methocarbamol (ROBAXIN) 500 MG tablet Take 1 tablet (500 mg total)  by mouth every 8 (eight) hours as needed for muscle spasms. 04/13/19  Yes Dhungel, Nishant, MD  metoprolol tartrate (LOPRESSOR) 25 MG tablet Take 12.5 mg by mouth 2 (two) times daily. 06/26/15  Yes [provider]  Multiple Vitamin (MULTI-VITAMINS) TABS Take 1 tablet by mouth daily.    Yes [provider]  nystatin cream (MYCOSTATIN) 1 application daily as needed for dry skin. 09/10/15  Yes [provider]  ondansetron (ZOFRAN) 4 MG tablet Take 4 mg by mouth every 8 (eight) hours as needed for nausea or vomiting.   Yes [provider]  pantoprazole (PROTONIX) 40 MG tablet Take 1 tablet (40 mg total) by mouth daily. 04/13/19  Yes Dhungel, Nishant, MD  polyethylene glycol (MIRALAX / GLYCOLAX) packet Take 17 g by mouth daily.    Yes [provider]  psyllium (REGULOID) 0.52 g capsule Take 1.04 g by mouth daily. 2 capsules daily   Yes [provider]  simvastatin (ZOCOR) 40 MG tablet Take 40 mg by mouth daily. 05/28/15  Yes [provider]  spironolactone (ALDACTONE) 25 MG tablet Take 12.5 mg by mouth  daily. 09/06/15  Yes [provider]  enoxaparin (LOVENOX) 40 MG/0.4ML injection Inject 0.4 mLs (40 mg total) into the skin daily for 25 days. 12/03/20 12/28/20  Charlynne Cousins, MD    Allergies Dust mite extract and Sulfa antibiotics  Family History  Problem Relation Age of Onset   Breast cancer Paternal Aunt        14's   Hypertension Mother     Social History Social History   Tobacco Use   Smoking status: Former   Smokeless tobacco: Never   Tobacco comments:    quit over 50 yrs ago  Substance Use Topics   Alcohol use: Yes    Alcohol/week: 5.0 standard drinks    Types: 5 Glasses of wine per week    Comment:     Drug use: No    Review of Systems  Review of Systems  Constitutional:  Positive for fatigue. Negative for fever.  HENT:  Negative for congestion and sore throat.   Eyes:  Negative for visual  disturbance.  Respiratory:  Negative for cough and shortness of breath.   Cardiovascular:  Negative for chest pain.  Gastrointestinal:  Positive for abdominal pain, diarrhea, nausea and vomiting.  Genitourinary:  Negative for flank pain.  Musculoskeletal:  Negative for back pain and neck pain.  Skin:  Negative for rash and wound.  Neurological:  Positive for weakness.  All other systems reviewed and are negative.   ____________________________________________  PHYSICAL EXAM:      VITAL SIGNS: ED Triage Vitals [01/08/21 1449]  Enc Vitals Group     BP 102/89     Pulse Rate 81     Resp 20     Temp 98 F (36.7 C)     Temp Source Oral     SpO2 97 %     Weight 145 lb 8.1 oz (66 kg)     Height 5\' 4"  (1.626 m)     Head Circumference      Peak Flow      Pain Score 0     Pain Loc      Pain Edu?      Excl. in [place]?      Physical Exam Vitals and nursing note reviewed.  Constitutional:      General: She is not in acute distress.    Appearance: She is well-developed.  HENT:     Head: Normocephalic and atraumatic.     Mouth/Throat:     Mouth: Mucous membranes are dry.  Eyes:     Conjunctiva/sclera: Conjunctivae normal.  Cardiovascular:     Rate and Rhythm: Normal rate and regular rhythm.     Heart sounds: Normal heart sounds. No murmur heard.   No friction rub.  Pulmonary:     Effort: Pulmonary effort is normal. No respiratory distress.     Breath sounds: Normal breath sounds. No wheezing or rales.  Abdominal:     General: There is no distension.     Palpations: Abdomen is soft.     Tenderness: There is abdominal tenderness (Moderate, generalized).  Musculoskeletal:     Cervical back: Neck supple.     Right lower leg: No edema.     Left lower leg: No edema.  Skin:    General: Skin is warm.     Capillary Refill: Capillary refill takes less than 2 seconds.  Neurological:     Mental Status: She is alert and oriented to person, place, and time.     Motor: No  abnormal  muscle tone.      ____________________________________________   LABS (all labs ordered are listed, but only abnormal results are displayed)  Labs Reviewed  CBC - Abnormal; Notable for the following components:      Result Value   WBC 34.0 (*)    RBC 3.49 (*)    Hemoglobin 10.9 (*)    HCT 32.6 (*)    Platelets 411 (*)    All other components within normal limits  BASIC METABOLIC PANEL - Abnormal; Notable for the following components:   Sodium 126 (*)    Chloride 96 (*)    CO2 21 (*)    Glucose, Bld 207 (*)    GFR, Estimated 58 (*)    All other components within normal limits  URINALYSIS, COMPLETE (UACMP) WITH MICROSCOPIC - Abnormal; Notable for the following components:   Color, Urine AMBER (*)    APPearance CLOUDY (*)    Hgb urine dipstick MODERATE (*)    Leukocytes,Ua MODERATE (*)    Bacteria, UA RARE (*)    All other components within normal limits  HEPATIC FUNCTION PANEL - Abnormal; Notable for the following components:   Albumin 3.2 (*)    Bilirubin, Direct 0.3 (*)    All other components within normal limits  URINE CULTURE  CULTURE, BLOOD (ROUTINE X 2)  CULTURE, BLOOD (ROUTINE X 2)  C DIFFICILE QUICK SCREEN W PCR REFLEX    GASTROINTESTINAL PANEL BY PCR, STOOL (REPLACES STOOL CULTURE)  RESP PANEL BY RT-PCR (FLU A&B, COVID) ARPGX2  LACTIC ACID, PLASMA  LIPASE, BLOOD    ____________________________________________  EKG:  ________________________________________  RADIOLOGY All imaging, including plain films, CT scans, and ultrasounds, independently reviewed by me, and interpretations confirmed via formal radiology reads.  ED MD interpretation:   CT abdomen/pelvis: Wall thickening of the distal transverse colon, splenic flexure, descending colon consistent with colitis, extensive diverticulosis, small bilateral effusions  Official radiology report(s): CT ABDOMEN PELVIS W CONTRAST  Result Date: 01/08/2021 CLINICAL DATA:  Abdomen pain blood in stool EXAM:  CT ABDOMEN AND PELVIS WITH CONTRAST TECHNIQUE: Multidetector CT imaging of the abdomen and pelvis was performed using the standard protocol following bolus administration of intravenous contrast. CONTRAST:  164mL OMNIPAQUE IOHEXOL 300 MG/ML  SOLN COMPARISON:  CT 12/21/2020, 01/20/2018 FINDINGS: Lower chest: Lung bases demonstrate small bilateral pleural effusions with passive atelectasis. Cardiomegaly with coronary vascular calcification. Hepatobiliary: No focal liver abnormality is seen. Status post cholecystectomy. No biliary dilatation. Pancreas: Unremarkable. No pancreatic ductal dilatation or surrounding inflammatory changes. Spleen: Normal in size without focal abnormality. Adrenals/Urinary Tract: Adrenal glands are normal. Mild bilateral hydronephrosis, significantly decreased compared to prior. The bladder contains a Foley catheter but is largely obscured by artifact. Delayed excretion of contrast from the ladder consistent with decreased renal function. Stomach/Bowel: The stomach is nonenlarged. No dilated small bowel. Liquid stool in the transverse and right colon. Wall thickening with inflammation at the distal transverse colon, splenic flexure and descending colon. Sigmoid colon diverticula without definite acute wall thickening. Vascular/Lymphatic: Advanced aortic atherosclerosis. No aneurysm. No suspicious lymph nodes. Reproductive: Status post hysterectomy. No adnexal masses. Other: Negative for pelvic effusion or free air. Moderate fat containing periumbilical ventral hernia. Musculoskeletal: Right hip replacement and intramedullary rodding of left femur with artifact. Advanced degenerative changes involving the lumbar spine at multiple levels. IMPRESSION: 1. Wall thickening of the distal transverse colon, splenic flexure and descending colon felt consistent with colitis of infectious, inflammatory, or ischemic etiology. Negative for perforation or abscess. 2. Extensive sigmoid  colon diverticular  disease but without acute wall thickening here. 3. Moderate fat containing periumbilical ventral hernia 4. Small bilateral effusions with passive atelectasis at the bases 5. Minimal bilateral hydronephrosis but overall decreased compared to the CT earlier this month. Electronically Signed   By: Donavan Foil M.D.   On: 01/08/2021 21:25    ____________________________________________  PROCEDURES   Procedure(s) performed (including Critical Care):  .Critical Care Performed by: Duffy Bruce, MD Authorized by: Duffy Bruce, MD   Critical care provider statement:    Critical care time (minutes):  30   Critical care time was exclusive of:  Separately billable procedures and treating other patients   Critical care was necessary to treat or prevent imminent or life-threatening deterioration of the following conditions:  Circulatory failure, cardiac failure and sepsis   Critical care was time spent personally by me on the following activities:  Development of treatment plan with patient or surrogate, discussions with consultants, evaluation of patient's response to treatment, examination of patient, ordering and review of laboratory studies, ordering and review of radiographic studies, ordering and performing treatments and interventions, pulse oximetry, re-evaluation of patient's condition and review of old charts  ____________________________________________  INITIAL IMPRESSION / MDM / [place] / ED COURSE  As part of my medical decision making, I reviewed the following data within the [facility] notes reviewed and incorporated, Old chart reviewed, Notes from prior ED visits, and [place] Controlled Substance Database       *Megan Salinas was evaluated in Emergency Department on 01/08/2021 for the symptoms described in the history of present illness. She was evaluated in the context of the global COVID-19 pandemic, which necessitated consideration that the  patient might be at risk for infection with the SARS-CoV-2 virus that causes COVID-19. Institutional protocols and algorithms that pertain to the evaluation of patients at risk for COVID-19 are in a state of rapid change based on information released by regulatory bodies including the CDC and federal and state organizations. These policies and algorithms were followed during the patient's care in the ED.  Some ED evaluations and interventions may be delayed as a result of limited staffing during the pandemic.*     Medical Decision Making: 87 year old female here with generalized weakness, abdominal pain.  She appears moderately dehydrated though not in distress.  Lab work shows marked leukocytosis with white count of 34,000.  BMP likely with mild hypovolemic hyponatremia and decreased bicarb from dehydration.  UA shows pyuria and hematuria, though she does have an indwelling Foley catheter.  Lactic acid 1.8.  Given her significant leukocytosis, hypotension, and concern for infection, patient covered empirically with broad-spectrum antibiotics.  CT abdomen and pelvis obtained, reviewed, shows wall thickening of the distal transverse colon, splenic flexure and descending colon.  This consistent with likely infectious colitis.  Given normal lactic acid, do not suspect ischemic etiology and she has no pain currently.  Given her recent antibiotic use, C. difficile is certainly a consideration.  She is unable to provide a stool sample at this time.  Will admit.  Flagyl has been added for GI coverage as well as possible C. difficile coverage.  This was discussed with patient's daughter at bedside who is in agreement with the plan. ____________________________________________  FINAL CLINICAL IMPRESSION(S) / ED DIAGNOSES  Final diagnoses:  Colitis  Other elevated white blood cell (WBC) count  Acute cystitis without hematuria  Foley catheter in place     MEDICATIONS GIVEN DURING THIS VISIT:  Medications   ceFEPIme (MAXIPIME) 2 g in sodium chloride 0.9 % 100 mL IVPB (has no administration in time range)  metroNIDAZOLE (FLAGYL) IVPB 500 mg (500 mg Intravenous New Bag/Given 01/08/21 2205)  lactated ringers bolus 1,000 mL (1,000 mLs Intravenous New Bag/Given 01/08/21 2124)  iohexol (OMNIPAQUE) 300 MG/ML solution 100 mL (100 mLs Intravenous Contrast Given 01/08/21 2055)     ED Discharge Orders     None        Note:  This document was prepared using Dragon voice recognition software and may include unintentional dictation errors.   Duffy Bruce, MD 01/08/21 Natividad Brood    Duffy Bruce, MD 01/08/21 2251

## 2021-01-08 NOTE — H&P (Signed)
History and Physical    Megan Salinas YCX:[id number] DOB: Dec 26, 1926 DOA: 01/08/2021  PCP: Kirk Ruths, MD    Patient coming from:  [facility]   Chief Complaint:  Abdominal pain  HPI:  Megan Salinas is a 87 y.o. female seen in ed with complaints of abdominal pain. Diffuse abdominal pain nausea and diarrhea constipation over the last several days.  Pain is worsening and progressive.  Patient reports some dark urine in her Foley that she had placed after recent hip replacement.  Currently on antibiotics for that  Pt has past medical history of allergies to dust mite extract and sulfa antibiotics, diabetes mellitus type 2, hypertension, congestive heart failure, hyperlipidemia, heart disease, right hip fracture. ED Course:   Vitals:   01/08/21 1449 01/08/21 1836 01/08/21 2030 01/09/21 0023  BP: 102/89 (!) 147/135 (!) 106/44 (!) 146/58  Pulse: 81 84 82 95  Resp: _0 Temp: 98 F (36.7 C)     TempSrc: Oral     SpO2: 97% 98% 99%   Weight: 66 kg     Height: _1  (1.626 m)      In the emergency room patient is alert awake afebrile oxygenating 97% on room air. Initial CMP shows sodium of 126 chloride of 96 glucose of 207, creatinine of 0.92 with a EGFR 58, normal LFTs, lactic acid of 1.8. CBC shows a white count of 34 point hemoglobin of 10.9, platelets of 411. Urinalysis is cloudy with amber-colored urine moderate hemoglobin and moderate leukocytes, 21-50 WBCs, urine culture obtained, blood culture obtained.  CT of the abdomen for abdominal pain shows liquid stool in the transverse colon and wall thickening of the transverse colon splenic flexure descending colon consistent with colitis, no perforation or abscess, extensive sigmoid colon diverticular disease but no acute wall thickening, moderate fat-containing periumbilical ventral hernia, small bilateral effusions with passive atelectasis, mild bilateral hydronephrosis but decreased compared to the CT scanned done  earlier on comparison per radiology report.  Per ED provider case highly suspicious for C. difficile colitis, and patient given cefepime and metronidazole.  Patient also given 1 L LR bolus in the ED.  Review of Systems:  Review of Systems  Gastrointestinal:  Positive for abdominal pain.  All other systems reviewed and are negative.   Past Medical History:  Diagnosis Date   Back pain    CAD (coronary artery disease)    CHF (congestive heart failure) (HCC)    GERD (gastroesophageal reflux disease)    High cholesterol    Hypertension    TIA (transient ischemic attack) 01/2016   "possible", no deficits   Wears hearing aid    bilateral    Past Surgical History:  Procedure Laterality Date   ABDOMINAL HYSTERECTOMY  1998   APPENDECTOMY     baldder tac  1995   BREAST CYST ASPIRATION Right    CARPAL TUNNEL RELEASE Right 03/26/2016   Procedure: CARPAL TUNNEL RELEASE ENDOSCOPIC  right;  Surgeon: Corky Mull, MD;  Location: [place];  Service: Orthopedics;  Laterality: Right;  bier block   CHOLECYSTECTOMY     CORONARY ARTERY BYPASS GRAFT  02/15/2010   [facility], 3 vessel   HIP ARTHROPLASTY Right 11/29/2020   Procedure: ARTHROPLASTY BIPOLAR HIP (HEMIARTHROPLASTY) REMOVAL OF HARDWARE RIGHT FEMUR;  Surgeon: Altamese [place], MD;  Location: [place];  Service: Orthopedics;  Laterality: Right;   INTRAMEDULLARY (IM) NAIL INTERTROCHANTERIC Left 04/11/2019   Procedure: INTRAMEDULLARY (IM) NAIL INTERTROCHANTRIC;  Surgeon: Leim Fabry,  MD;  Location: [facility];  Service: Orthopedics;  Laterality: Left;   TONSILLECTOMY       reports that she has quit smoking. She has never used smokeless tobacco. She reports current alcohol use of about 5.0 standard drinks per week. She reports that she does not use drugs.  Allergies  Allergen Reactions   Dust Mite Extract Other (See Comments)    Runny nose   Sulfa Antibiotics Itching and Rash    Red, itchy palms    Family History  Problem Relation Age of  Onset   Breast cancer Paternal Aunt        42's   Hypertension Mother     Prior to Admission medications   Medication Sig Start Date End Date Taking? Authorizing Provider  acetaminophen (TYLENOL) 500 MG tablet Take 500 mg by mouth every 6 (six) hours as needed for mild pain.    Yes [provider]  alum & mag hydroxide-simeth (MAALOX/MYLANTA) 200-200-20 MG/5ML suspension Take 30 mLs by mouth every 4 (four) hours as needed for indigestion or heartburn.   Yes [provider]  amitriptyline (ELAVIL) 25 MG tablet Take 25 mg by mouth at bedtime.  09/12/15  Yes [provider]  aspirin EC 81 MG tablet Take 81 mg by mouth daily.    Yes [provider]  bisacodyl (DULCOLAX) 10 MG suppository Place 10 mg rectally as needed for moderate constipation.   Yes [provider]  calcium citrate (CALCITRATE - DOSED IN MG ELEMENTAL CALCIUM) 950 (200 Ca) MG tablet Take 1 tablet (200 mg of elemental calcium total) by mouth 2 (two) times daily. 12/03/20 01/08/21 Yes Ainsley Spinner, PA-C  Cholecalciferol (VITAMIN D) 125 MCG (5000 UT) CAPS Take 1 capsule by mouth daily. 12/03/20  Yes Ainsley Spinner, PA-C  dicyclomine (BENTYL) 20 MG tablet Take 20 mg by mouth every 6 (six) hours.   Yes [provider]  docusate sodium (COLACE) 100 MG capsule Take 1 capsule (100 mg total) by mouth 2 (two) times daily. 04/13/19  Yes Dhungel, Nishant, MD  famotidine (PEPCID) 40 MG tablet Take 40 mg by mouth at bedtime as needed for heartburn. 11/16/20  Yes [provider]  [facility]) OINT Apply 1 application topically daily as needed.   Yes [provider]  lisinopril (PRINIVIL,ZESTRIL) 20 MG tablet Take 20 mg by mouth daily.  05/19/16  Yes [provider]  methocarbamol (ROBAXIN) 500 MG tablet Take 1 tablet (500 mg total) by mouth every 8 (eight) hours as needed for muscle spasms. 04/13/19  Yes Dhungel, Nishant, MD  metoprolol tartrate (LOPRESSOR)  25 MG tablet Take 12.5 mg by mouth 2 (two) times daily. 06/26/15  Yes [provider]  Multiple Vitamin (MULTI-VITAMINS) TABS Take 1 tablet by mouth daily.    Yes [provider]  nystatin cream (MYCOSTATIN) 1 application daily as needed for dry skin. 09/10/15  Yes [provider]  ondansetron (ZOFRAN) 4 MG tablet Take 4 mg by mouth every 8 (eight) hours as needed for nausea or vomiting.   Yes [provider]  pantoprazole (PROTONIX) 40 MG tablet Take 1 tablet (40 mg total) by mouth daily. 04/13/19  Yes Dhungel, Nishant, MD  polyethylene glycol (MIRALAX / GLYCOLAX) packet Take 17 g by mouth daily.    Yes [provider]  psyllium (REGULOID) 0.52 g capsule Take 1.04 g by mouth daily. 2 capsules daily   Yes [provider]  simvastatin (ZOCOR) 40 MG tablet Take 40 mg by  mouth daily. 05/28/15  Yes [provider]  spironolactone (ALDACTONE) 25 MG tablet Take 12.5 mg by mouth daily. 09/06/15  Yes [provider]  enoxaparin (LOVENOX) 40 MG/0.4ML injection Inject 0.4 mLs (40 mg total) into the skin daily for 25 days. 12/03/20 12/28/20  Charlynne Cousins, MD    Physical Exam: Vitals:   01/08/21 1449 01/08/21 1836 01/08/21 2030 01/09/21 0023  BP: 102/89 (!) 147/135 (!) 106/44 (!) 146/58  Pulse: 81 84 82 95  Resp: _0 Temp: 98 F (36.7 C)     TempSrc: Oral     SpO2: 97% 98% 99%   Weight: 66 kg     Height: _1  (1.626 m)      Physical Exam Vitals reviewed.  Constitutional:      General: She is not in acute distress.    Appearance: She is not ill-appearing.  HENT:     Head: Normocephalic and atraumatic.     Right Ear: External ear normal.     Left Ear: External ear normal.     Nose: Nose normal.     Mouth/Throat:     Mouth: Mucous membranes are dry.  Eyes:     Extraocular Movements: Extraocular movements intact.     Pupils: Pupils are equal, round, and reactive to light.  Neck:     Vascular: No carotid bruit.   Cardiovascular:     Rate and Rhythm: Normal rate and regular rhythm.     Pulses: Normal pulses.     Heart sounds: Normal heart sounds.  Pulmonary:     Effort: Pulmonary effort is normal.     Breath sounds: Normal breath sounds.  Abdominal:     General: Bowel sounds are normal. There is no distension.     Palpations: Abdomen is soft. There is no mass.     Tenderness: There is no abdominal tenderness. There is no guarding.     Hernia: No hernia is present.  Musculoskeletal:     Right lower leg: No edema.     Left lower leg: No edema.  Skin:    General: Skin is warm.  Neurological:     General: No focal deficit present.     Mental Status: She is alert. She is disoriented.  Psychiatric:        Mood and Affect: Mood normal.        Behavior: Behavior normal.   Labs on Admission: I have personally reviewed following labs and imaging studies  No results for input(s): CKTOTAL, CKMB, TROPONINI in the last 72 hours. Lab Results  Component Value Date   WBC 34.0 (H) 01/08/2021   HGB 10.9 (L) 01/08/2021   HCT 32.6 (L) 01/08/2021   MCV 93.4 01/08/2021   PLT 411 (H) 01/08/2021    Recent Labs  Lab 01/08/21 1451  NA 126*  K 4.4  CL 96*  CO2 21*  BUN 22  CREATININE 0.92  CALCIUM 9.3  PROT 6.5  BILITOT 1.2  ALKPHOS 104  ALT 11  AST 22  GLUCOSE 207*   Lab Results  Component Value Date   CHOL 160 01/29/2016   HDL 61 01/29/2016   LDLCALC 67 01/29/2016   TRIG 158 (H) 01/29/2016   No results found for: DDIMER Invalid input(s): POCBNP   COVID-19 Labs No results for input(s): DDIMER, FERRITIN, LDH, CRP in the last 72 hours. Lab Results  Component Value Date   SARSCOV2NAA NEGATIVE 01/08/2021   SARSCOV2NAA NEGATIVE 12/03/2020   SARSCOV2NAA NEGATIVE  11/28/2020   [place] NEGATIVE 04/13/2019    Radiological Exams on Admission: CT ABDOMEN PELVIS W CONTRAST  Result Date: 01/08/2021 CLINICAL DATA:  Abdomen pain blood in stool EXAM: CT ABDOMEN AND PELVIS WITH CONTRAST  TECHNIQUE: Multidetector CT imaging of the abdomen and pelvis was performed using the standard protocol following bolus administration of intravenous contrast. CONTRAST:  158m OMNIPAQUE IOHEXOL 300 MG/ML  SOLN COMPARISON:  CT 12/21/2020, 01/20/2018 FINDINGS: Lower chest: Lung bases demonstrate small bilateral pleural effusions with passive atelectasis. Cardiomegaly with coronary vascular calcification. Hepatobiliary: No focal liver abnormality is seen. Status post cholecystectomy. No biliary dilatation. Pancreas: Unremarkable. No pancreatic ductal dilatation or surrounding inflammatory changes. Spleen: Normal in size without focal abnormality. Adrenals/Urinary Tract: Adrenal glands are normal. Mild bilateral hydronephrosis, significantly decreased compared to prior. The bladder contains a Foley catheter but is largely obscured by artifact. Delayed excretion of contrast from the ladder consistent with decreased renal function. Stomach/Bowel: The stomach is nonenlarged. No dilated small bowel. Liquid stool in the transverse and right colon. Wall thickening with inflammation at the distal transverse colon, splenic flexure and descending colon. Sigmoid colon diverticula without definite acute wall thickening. Vascular/Lymphatic: Advanced aortic atherosclerosis. No aneurysm. No suspicious lymph nodes. Reproductive: Status post hysterectomy. No adnexal masses. Other: Negative for pelvic effusion or free air. Moderate fat containing periumbilical ventral hernia. Musculoskeletal: Right hip replacement and intramedullary rodding of left femur with artifact. Advanced degenerative changes involving the lumbar spine at multiple levels. IMPRESSION: 1. Wall thickening of the distal transverse colon, splenic flexure and descending colon felt consistent with colitis of infectious, inflammatory, or ischemic etiology. Negative for perforation or abscess. 2. Extensive sigmoid colon diverticular disease but without acute wall  thickening here. 3. Moderate fat containing periumbilical ventral hernia 4. Small bilateral effusions with passive atelectasis at the bases 5. Minimal bilateral hydronephrosis but overall decreased compared to the CT earlier this month. Electronically Signed   By: KDonavan FoilM.D.   On: 01/08/2021 21:25    EKG: Independently reviewed.  None   Assessment/Plan: Principal Problem:   Abdominal pain, diffuse Active Problems:   Abdominal pain   Colitis   Chronic diastolic CHF (congestive heart failure) (HCC)   Coronary artery disease   Benign essential hypertension   Type 2 diabetes mellitus without complication (HCC)   GERD (gastroesophageal reflux disease)   Abdominal pain/colitis: Patient complaining of diffuse abdominal pain for the past few days diagnosed with colitis on CT scan and started on cefepime and metronidazole. We will obtain stool for C. difficile and start p.o. vancomycin.  In the meantime we have continued patient on metronidazole for the nonsevere C.diff. Will place patient on enteric precautions. Will start patient on a  liquid diet as tolerated, along with Mylanta and IV PPI.  Chronic diastolic congestive heart failure/CAD: Patient continued on her home regimen with Aspirin 81, Aldactone 12.5, lisinopril 20 mg, and metoprolol 12.5 mg twice daily.  Hypertension: Again patient continued on lisinopril, metoprolol, Aldactone.   Diabetes mellitus type 2: We will continue patient on sliding scale insulin regimen and Accu-Cheks.  GERD: IV PPI.  DVT prophylaxis:  Heparin   Code Status:  Full code    Family Communication:  OSu Grand(Daughter)  [phone](Home Phone)   Disposition Plan:  SNF   Consults called:  None   Admission status: Inpatient.     EPara SkeansMD [facility]  6 PM- 2 AM. Please contact me via secure Chat 6 PM-2 AM. How to contact the [facility]ending  or Consulting provider [place] or covering provider during after hours  [place], for this patient.   Check the care team in [facility] and look for a) attending/consulting [facility] provider listed and b) the [facility] team listed Log into [email or web address] and use [place]'s universal password to access. If you do not have the password, please contact the hospital operator. Locate the [facility] provider you are looking for under [facility] and page to a number that you can be directly reached. If you still have difficulty reaching the provider, please page the [facility] (Director on Call) for the Hospitalists listed on amion for assistance. [email or web address] 01/09/2021, 12:56 AM

## 2021-01-08 NOTE — ED Notes (Signed)
Pt returned from CT via stretcher with CT tech.

## 2021-01-08 NOTE — ED Triage Notes (Signed)
Pt to ED With daughter for blood in stools and urine. Daughter also reports has elevated white count. NAD noted Pt describes stool as having "bright red spot" Pt has chronic foley in , no blood noted.  Pt with skin color WDL, moist mucus membranes

## 2021-01-08 NOTE — Consult Note (Signed)
PHARMACY -  BRIEF ANTIBIOTIC NOTE   Pharmacy has received consult(s) for Cefepime from an ED provider.  The patient's profile has been reviewed for ht/wt/allergies/indication/available labs.    One time order(s) placed for Cefepime IV 2g x1 in ED  Further antibiotics/pharmacy consults should be ordered by admitting physician if indicated.                       Thank you, Lorna Dibble 01/08/2021  9:24 PM

## 2021-01-08 NOTE — ED Notes (Signed)
Daughter at stat desk numerous times, upset over wait times, no bed for pt to lie on.  Explained numerous times to family, no beds available at this time.  Pt given another warm blanket and ice chips.  Pt alert and talking, sitting up in wheelchair.

## 2021-01-08 NOTE — ED Triage Notes (Addendum)
Presents via EMS from [facility]  had some abnormal lab this am

## 2021-01-09 DIAGNOSIS — I2510 Atherosclerotic heart disease of native coronary artery without angina pectoris: Secondary | ICD-10-CM

## 2021-01-09 DIAGNOSIS — Z978 Presence of other specified devices: Secondary | ICD-10-CM | POA: Insufficient documentation

## 2021-01-09 DIAGNOSIS — I5032 Chronic diastolic (congestive) heart failure: Secondary | ICD-10-CM | POA: Diagnosis not present

## 2021-01-09 DIAGNOSIS — N3000 Acute cystitis without hematuria: Secondary | ICD-10-CM

## 2021-01-09 DIAGNOSIS — R1084 Generalized abdominal pain: Secondary | ICD-10-CM | POA: Diagnosis not present

## 2021-01-09 DIAGNOSIS — I10 Essential (primary) hypertension: Secondary | ICD-10-CM | POA: Diagnosis not present

## 2021-01-09 DIAGNOSIS — A09 Infectious gastroenteritis and colitis, unspecified: Principal | ICD-10-CM

## 2021-01-09 LAB — COMPREHENSIVE METABOLIC PANEL
ALT: 11 U/L (ref 0–44)
AST: 14 U/L — ABNORMAL LOW (ref 15–41)
Albumin: 2.7 g/dL — ABNORMAL LOW (ref 3.5–5.0)
Alkaline Phosphatase: 99 U/L (ref 38–126)
Anion gap: 7 (ref 5–15)
BUN: 22 mg/dL (ref 8–23)
CO2: 24 mmol/L (ref 22–32)
Calcium: 9.5 mg/dL (ref 8.9–10.3)
Chloride: 100 mmol/L (ref 98–111)
Creatinine, Ser: 0.71 mg/dL (ref 0.44–1.00)
GFR, Estimated: >60 mL/min (ref >60)
Glucose, Bld: 136 mg/dL — ABNORMAL HIGH (ref 70–99)
Potassium: 4.1 mmol/L (ref 3.5–5.1)
Sodium: 131 mmol/L — ABNORMAL LOW (ref 135–145)
Total Bilirubin: 0.9 mg/dL (ref 0.3–1.2)
Total Protein: 5.9 g/dL — ABNORMAL LOW (ref 6.5–8.1)

## 2021-01-09 LAB — CBC
HCT: 30.2 % — ABNORMAL LOW (ref 36.0–46.0)
Hemoglobin: 9.9 g/dL — ABNORMAL LOW (ref 12.0–15.0)
MCH: 30.4 pg (ref 26.0–34.0)
MCHC: 32.8 g/dL (ref 30.0–36.0)
MCV: 92.6 fL (ref 80.0–100.0)
Platelets: 349 10*3/uL (ref 150–400)
RBC: 3.26 MIL/uL — ABNORMAL LOW (ref 3.87–5.11)
RDW: 15.3 % (ref 11.5–15.5)
WBC: 27 10*3/uL — ABNORMAL HIGH (ref 4.0–10.5)
nRBC: 0 % (ref 0.0–0.2)

## 2021-01-09 LAB — CBG MONITORING, ED
Glucose-Capillary: 127 mg/dL — ABNORMAL HIGH (ref 70–99)
Glucose-Capillary: 169 mg/dL — ABNORMAL HIGH (ref 70–99)
Glucose-Capillary: 161 mg/dL — ABNORMAL HIGH (ref 70–99)

## 2021-01-09 LAB — URINE CULTURE: Culture: NO GROWTH

## 2021-01-09 LAB — HEMOGLOBIN A1C
Hgb A1c MFr Bld: 6.4 % — ABNORMAL HIGH (ref 4.8–5.6)
Mean Plasma Glucose: 136.98 mg/dL

## 2021-01-09 MED ORDER — VANCOMYCIN HCL 125 MG PO CAPS
125.0000 mg | ORAL_CAPSULE | Freq: Four times a day (QID) | ORAL | Status: DC
  Filled 2021-01-09 (×3): qty 1

## 2021-01-09 MED ORDER — SODIUM CHLORIDE 0.9 % IV SOLN
2.0000 g | Freq: Two times a day (BID) | INTRAVENOUS | Status: DC
  Administered 2021-01-09 – 2021-01-10 (×3): 2 g via INTRAVENOUS
  Filled 2021-01-09 (×4): qty 2

## 2021-01-09 MED ORDER — CHLORHEXIDINE GLUCONATE CLOTH 2 % EX PADS
6.0000 | MEDICATED_PAD | Freq: Every day | CUTANEOUS | Status: DC
  Administered 2021-01-09 – 2021-01-10 (×2): 6 via TOPICAL

## 2021-01-09 NOTE — Progress Notes (Signed)
Pharmacy Antibiotic Note  Megan Salinas is a 87 y.o. female admitted on 01/08/2021 with colitis.  Pharmacy has been consulted for Cefepime dosing.  Plan: Cefepime 2 gm q12h per indication and current renal fxn.  Pharmacy will continue to follow and adjust abx dosing whenever warranted.  Height: 5\' 4"  (162.6 cm) Weight: 66 kg (145 lb 8.1 oz) IBW/kg (Calculated) : 54.7  Temp (24hrs), Avg:98 F (36.7 C), Min:98 F (36.7 C), Max:98 F (36.7 C)  Recent Labs  Lab 01/08/21 1451 01/08/21 2112  WBC 34.0*  --   CREATININE 0.92  --   LATICACIDVEN  --  1.8    Estimated Creatinine Clearance: 36.5 mL/min (by C-G formula based on SCr of 0.92 mg/dL).    Allergies  Allergen Reactions   Dust Mite Extract Other (See Comments)    Runny nose   Sulfa Antibiotics Itching and Rash    Red, itchy palms    Antimicrobials this admission: 12/28 Cefepime >>  12/27 Flagyl >>   Microbiology results: 12/27 BCx: Pending 12/27 UCx: Pending  12/27 C Diff: Pending  12/27 GI Panel:  Pending  Thank you for allowing pharmacy to be a part of this patients care.  Renda Rolls, PharmD, MBA 01/09/2021 1:30 AM

## 2021-01-09 NOTE — Progress Notes (Addendum)
PROGRESS NOTE  Megan Salinas    DOB: January 26, 1926, 87 y.o.  HLK:[id number]  PCP: Kirk Ruths, MD   Code Status: Full Code   DOA: 01/08/2021   LOS: 1  Brief Narrative of Current Hospitalization  Megan Salinas is a 87 y.o. female with a PMH significant for type II DM, HTN, HFrEF, HLD, CAD, GERD, DDD, lumbar back pain, moderate mitral and tricuspid insufficiency, frequent PVCs, OA, lymphedema, hard of hearing. They presented from [facility] to the ED on 01/08/2021 with abdominal pain and diarrhea x several days. In the ED, it was found that they had dehydration.  CT abdomen showed diffuse colitis likely infectious but no perforation or abscess.  There was improvement in hydronephrosis from previous imaging.  Patient has indwelling Foley catheter since her previous surgery.  They were treated with supportive care including IV hydration, antibiotics, home medications.  Patient was admitted to medicine service for further workup and management of colitis as outlined in detail below.  01/09/21 -colitis  Assessment & Plan  Principal Problem:   Abdominal pain, diffuse Active Problems:   Benign essential hypertension   Chronic diastolic CHF (congestive heart failure) (HCC)   Coronary artery disease   GERD (gastroesophageal reflux disease)   Type 2 diabetes mellitus without complication (HCC)   Colitis   Abdominal pain  Colitis, likely infectious-patient has not had bowel movement since presentation.  Awaiting to collect sample for C. difficile/GI pathogens.  Potentially viral source which is improving spontaneously.  Patient endorsing improvement in her abdominal pain since admission.  White blood cells have improved from 34-27. -Continue IV fluids and antiemetics until taking better p.o. -PT/OT for acute physical deconditioning -Strict I's/O -Continue IV antibiotics -CBC a.m. -Follow-up stool studies once collected  CAD   HTN   HFrEF   HLD   frequent PVCs-well-controlled BP,  asymptomatic -Continue home medications  History of type II DM-A1c is 6.4 and has been well controlled under goal of 8 for patient's age for many years.  We will no longer require patient to have frequent glucose monitoring or insulin injections. -Discontinue sliding scale.  Acute urinary retention s/p surgery in previous admission.-Bilateral hydronephrosis is improved on CT pelvic imaging this admission from previous. -Maintain Foley catheter until outpatient urology follow-up  Hard of hearing- patient has hearing aids. Sometimes can appear confused but it is primarily due to her poor hearing. Daughter confirms she is about at baseline for mental status.  DVT prophylaxis: heparin injection 5,000 Units Start: 01/08/21 2345 SCDs Start: 01/08/21 2337  Diet:  Diet Orders (From admission, onward)     Start     Ordered   01/08/21 2337  Diet full liquid Room service appropriate? Yes; Fluid consistency: Thin  Diet effective now       Question Answer Comment  Room service appropriate? Yes   Fluid consistency: Thin      01/08/21 2336            Subjective 01/09/21    Pt reports feeling improved since admission.  Denies current abdominal pain.  She has not had a bowel movement since presentation to the hospital.  Disposition Plan & Communication  Patient status: Inpatient  Admitted From: SNF Disposition: Skilled nursing facility Anticipated discharge date: 12/29  Family Communication: daughter at bedside  Consults, Procedures, Significant Events  Consultants:  none  Procedures/significant events:  None  Antimicrobials:  Anti-infectives (From admission, onward)    Start     Dose/Rate Route Frequency Ordered Stop  01/09/21 1000  metroNIDAZOLE (FLAGYL) IVPB 500 mg        500 mg 100 mL/hr over 60 Minutes Intravenous Every 12 hours 01/08/21 2336 01/13/21 2159   01/09/21 1000  ceFEPIme (MAXIPIME) 2 g in sodium chloride 0.9 % 100 mL IVPB        2 g 200 mL/hr over 30 Minutes  Intravenous Every 12 hours 01/09/21 0128     01/09/21 0100  vancomycin (VANCOCIN) capsule 125 mg  Status:  Discontinued        125 mg Oral 4 times daily 01/09/21 0054 01/09/21 0057   01/08/21 2145  metroNIDAZOLE (FLAGYL) IVPB 500 mg        500 mg 100 mL/hr over 60 Minutes Intravenous  Once 01/08/21 2131 01/09/21 0047   01/08/21 2130  ceFEPIme (MAXIPIME) 2 g in sodium chloride 0.9 % 100 mL IVPB        2 g 200 mL/hr over 30 Minutes Intravenous  Once 01/08/21 2124 01/09/21 0123       Objective   Vitals:   01/09/21 0430 01/09/21 0500 01/09/21 0530 01/09/21 0600  BP: 138/60 125/73 (!) 139/57 129/63  Pulse: 95 94 95 93  Resp:    17  Temp:      TempSrc:      SpO2: 96% 94% 93% 95%  Weight:      Height:        Intake/Output Summary (Last 24 hours) at 01/09/2021 0729 Last data filed at 01/09/2021 0123 Gross per 24 hour  Intake 1200 ml  Output --  Net 1200 ml   Filed Weights   01/08/21 1449  Weight: 66 kg    Patient BMI: Body mass index is 24.98 kg/m.   Physical Exam:  General: awake, alert, NAD HEENT: atraumatic, clear conjunctiva, anicteric sclera, MMM, hard of hearing Respiratory: normal respiratory effort. Cardiovascular: normal S1/S2, RRR, no JVD, murmurs, quick capillary refill  Gastrointestinal: soft, NT, softly distended Nervous: A&O x3. no gross focal neurologic deficits, normal speech Extremities: moves all equally, trace lower extremity edema, normal tone Skin: dry, intact, normal temperature, normal color. No rashes, lesions or ulcers on exposed skin Psychiatry: normal mood, congruent affect  Labs   I have personally reviewed following labs and imaging studies Admission on 01/08/2021  Component Date Value Ref Range Status   WBC 01/08/2021 34.0 (H)  4.0 - 10.5 K/uL Final   RBC 01/08/2021 3.49 (L)  3.87 - 5.11 MIL/uL Final   Hemoglobin 01/08/2021 10.9 (L)  12.0 - 15.0 g/dL Final   HCT 01/08/2021 32.6 (L)  36.0 - 46.0 % Final   MCV 01/08/2021 93.4  80.0 - 100.0  fL Final   MCH 01/08/2021 31.2  26.0 - 34.0 pg Final   MCHC 01/08/2021 33.4  30.0 - 36.0 g/dL Final   RDW 01/08/2021 15.4  11.5 - 15.5 % Final   Platelets 01/08/2021 411 (H)  150 - 400 K/uL Final   nRBC 01/08/2021 0.1  0.0 - 0.2 % Final   Sodium 01/08/2021 126 (L)  135 - 145 mmol/L Final   Potassium 01/08/2021 4.4  3.5 - 5.1 mmol/L Final   Chloride 01/08/2021 96 (L)  98 - 111 mmol/L Final   CO2 01/08/2021 21 (L)  22 - 32 mmol/L Final   Glucose, Bld 01/08/2021 207 (H)  70 - 99 mg/dL Final   BUN 01/08/2021 22  8 - 23 mg/dL Final   Creatinine, Ser 01/08/2021 0.92  0.44 - 1.00 mg/dL Final   Calcium 01/08/2021 9.3  8.9 - 10.3 mg/dL Final   GFR, Estimated 01/08/2021 58 (L)  >60 mL/min Final   Anion gap 01/08/2021 9  5 - 15 Final   Color, Urine 01/08/2021 AMBER (A)  YELLOW Final   APPearance 01/08/2021 CLOUDY (A)  CLEAR Final   Specific Gravity, Urine 01/08/2021 1.017  1.005 - 1.030 Final   pH 01/08/2021 5.0  5.0 - 8.0 Final   Glucose, UA 01/08/2021 NEGATIVE  NEGATIVE mg/dL Final   Hgb urine dipstick 01/08/2021 MODERATE (A)  NEGATIVE Final   Bilirubin Urine 01/08/2021 NEGATIVE  NEGATIVE Final   Ketones, ur 01/08/2021 NEGATIVE  NEGATIVE mg/dL Final   Protein, ur 01/08/2021 NEGATIVE  NEGATIVE mg/dL Final   Nitrite 01/08/2021 NEGATIVE  NEGATIVE Final   Leukocytes,Ua 01/08/2021 MODERATE (A)  NEGATIVE Final   RBC / HPF 01/08/2021 11-20  0 - 5 RBC/hpf Final   WBC, UA 01/08/2021 21-50  0 - 5 WBC/hpf Final   Bacteria, UA 01/08/2021 RARE (A)  NONE SEEN Final   Squamous Epithelial / LPF 01/08/2021 0-5  0 - 5 Final   WBC Clumps 01/08/2021 PRESENT   Final   Mucus 01/08/2021 PRESENT   Final   Hyaline Casts, UA 01/08/2021 PRESENT   Final   Ca Oxalate Crys, UA 01/08/2021 PRESENT   Final   Lactic Acid, Venous 01/08/2021 1.8  0.5 - 1.9 mmol/L Final   Total Protein 01/08/2021 6.5  6.5 - 8.1 g/dL Final   Albumin 01/08/2021 3.2 (L)  3.5 - 5.0 g/dL Final   AST 01/08/2021 22  15 - 41 U/L Final   ALT  01/08/2021 11  0 - 44 U/L Final   Alkaline Phosphatase 01/08/2021 104  38 - 126 U/L Final   Total Bilirubin 01/08/2021 1.2  0.3 - 1.2 mg/dL Final   Bilirubin, Direct 01/08/2021 0.3 (H)  0.0 - 0.2 mg/dL Final   Indirect Bilirubin 01/08/2021 0.9  0.3 - 0.9 mg/dL Final   Lipase 01/08/2021 21  11 - 51 U/L Final   SARS Coronavirus 2 by RT PCR 01/08/2021 NEGATIVE  NEGATIVE Final   Influenza A by PCR 01/08/2021 NEGATIVE  NEGATIVE Final   Influenza B by PCR 01/08/2021 NEGATIVE  NEGATIVE Final    Imaging Studies  CT ABDOMEN PELVIS W CONTRAST  Result Date: 01/08/2021 CLINICAL DATA:  Abdomen pain blood in stool EXAM: CT ABDOMEN AND PELVIS WITH CONTRAST TECHNIQUE: Multidetector CT imaging of the abdomen and pelvis was performed using the standard protocol following bolus administration of intravenous contrast. CONTRAST:  165mL OMNIPAQUE IOHEXOL 300 MG/ML  SOLN COMPARISON:  CT 12/21/2020, 01/20/2018 FINDINGS: Lower chest: Lung bases demonstrate small bilateral pleural effusions with passive atelectasis. Cardiomegaly with coronary vascular calcification. Hepatobiliary: No focal liver abnormality is seen. Status post cholecystectomy. No biliary dilatation. Pancreas: Unremarkable. No pancreatic ductal dilatation or surrounding inflammatory changes. Spleen: Normal in size without focal abnormality. Adrenals/Urinary Tract: Adrenal glands are normal. Mild bilateral hydronephrosis, significantly decreased compared to prior. The bladder contains a Foley catheter but is largely obscured by artifact. Delayed excretion of contrast from the ladder consistent with decreased renal function. Stomach/Bowel: The stomach is nonenlarged. No dilated small bowel. Liquid stool in the transverse and right colon. Wall thickening with inflammation at the distal transverse colon, splenic flexure and descending colon. Sigmoid colon diverticula without definite acute wall thickening. Vascular/Lymphatic: Advanced aortic atherosclerosis. No  aneurysm. No suspicious lymph nodes. Reproductive: Status post hysterectomy. No adnexal masses. Other: Negative for pelvic effusion or free air. Moderate fat containing periumbilical ventral hernia. Musculoskeletal:  Right hip replacement and intramedullary rodding of left femur with artifact. Advanced degenerative changes involving the lumbar spine at multiple levels. IMPRESSION: 1. Wall thickening of the distal transverse colon, splenic flexure and descending colon felt consistent with colitis of infectious, inflammatory, or ischemic etiology. Negative for perforation or abscess. 2. Extensive sigmoid colon diverticular disease but without acute wall thickening here. 3. Moderate fat containing periumbilical ventral hernia 4. Small bilateral effusions with passive atelectasis at the bases 5. Minimal bilateral hydronephrosis but overall decreased compared to the CT earlier this month. Electronically Signed   By: Donavan Foil M.D.   On: 01/08/2021 21:25    Medications   Scheduled Meds:  amitriptyline  25 mg Oral QHS   aspirin EC  81 mg Oral Daily   heparin  5,000 Units Subcutaneous Q8H   insulin aspart  0-9 Units Subcutaneous TID WC   lisinopril  20 mg Oral Daily   metoprolol tartrate  12.5 mg Oral BID   pantoprazole  40 mg Oral Q breakfast   simvastatin  40 mg Oral QHS   spironolactone  12.5 mg Oral Daily   No recently discontinued medications to reconcile  LOS: 1 day   Richarda Osmond, DO [facility] 01/09/2021, 7:29 AM   Available by Epic secure chat 7AM-7PM. If 7PM-7AM, please contact night-coverage Refer to [email or web address] to contact the [facility] Attending or Consulting provider for this pt

## 2021-01-10 DIAGNOSIS — I10 Essential (primary) hypertension: Secondary | ICD-10-CM | POA: Diagnosis not present

## 2021-01-10 DIAGNOSIS — R1084 Generalized abdominal pain: Secondary | ICD-10-CM | POA: Diagnosis not present

## 2021-01-10 DIAGNOSIS — K529 Noninfective gastroenteritis and colitis, unspecified: Secondary | ICD-10-CM

## 2021-01-10 DIAGNOSIS — K219 Gastro-esophageal reflux disease without esophagitis: Secondary | ICD-10-CM

## 2021-01-10 DIAGNOSIS — N3000 Acute cystitis without hematuria: Secondary | ICD-10-CM | POA: Diagnosis not present

## 2021-01-10 LAB — COMPREHENSIVE METABOLIC PANEL
ALT: 12 U/L (ref 0–44)
AST: 14 U/L — ABNORMAL LOW (ref 15–41)
Albumin: 2.4 g/dL — ABNORMAL LOW (ref 3.5–5.0)
Alkaline Phosphatase: 85 U/L (ref 38–126)
Anion gap: 6 (ref 5–15)
BUN: 17 mg/dL (ref 8–23)
CO2: 23 mmol/L (ref 22–32)
Calcium: 9 mg/dL (ref 8.9–10.3)
Chloride: 105 mmol/L (ref 98–111)
Creatinine, Ser: 0.53 mg/dL (ref 0.44–1.00)
GFR, Estimated: >60 mL/min (ref >60)
Glucose, Bld: 131 mg/dL — ABNORMAL HIGH (ref 70–99)
Potassium: 3.8 mmol/L (ref 3.5–5.1)
Sodium: 134 mmol/L — ABNORMAL LOW (ref 135–145)
Total Bilirubin: 0.6 mg/dL (ref 0.3–1.2)
Total Protein: 5.4 g/dL — ABNORMAL LOW (ref 6.5–8.1)

## 2021-01-10 LAB — GASTROINTESTINAL PANEL BY PCR, STOOL (REPLACES STOOL CULTURE)

## 2021-01-10 LAB — CBC
HCT: 29 % — ABNORMAL LOW (ref 36.0–46.0)
Hemoglobin: 9.5 g/dL — ABNORMAL LOW (ref 12.0–15.0)
MCH: 30.4 pg (ref 26.0–34.0)
MCHC: 32.8 g/dL (ref 30.0–36.0)
MCV: 92.9 fL (ref 80.0–100.0)
Platelets: 309 10*3/uL (ref 150–400)
RBC: 3.12 MIL/uL — ABNORMAL LOW (ref 3.87–5.11)
RDW: 15.6 % — ABNORMAL HIGH (ref 11.5–15.5)
WBC: 15.6 10*3/uL — ABNORMAL HIGH (ref 4.0–10.5)
nRBC: 0 % (ref 0.0–0.2)

## 2021-01-10 LAB — C DIFFICILE QUICK SCREEN W PCR REFLEX
C Diff antigen: NEGATIVE
C Diff interpretation: NOT DETECTED
C Diff toxin: NEGATIVE

## 2021-01-10 MED ORDER — CEPHALEXIN 250 MG PO CAPS: 250.0000 mg | ORAL_CAPSULE | Freq: Four times a day (QID) | ORAL | 0 refills | Status: AC

## 2021-01-10 MED ORDER — CEPHALEXIN 250 MG PO CAPS
250.0000 mg | ORAL_CAPSULE | Freq: Four times a day (QID) | ORAL | 0 refills | Status: AC
Start: 2021-01-10 — End: 2021-01-13

## 2021-01-10 NOTE — Evaluation (Signed)
Physical Therapy Evaluation Patient Details Name: Megan Salinas MRN: [id number] DOB: 10-25-1926 Today's Date: 01/10/2021  History of Present Illness  87 y.o. female with a PMH significant for type II DM, HTN, HFrEF, HLD, CAD, GERD, DDD, lumbar back pain, moderate mitral and tricuspid insufficiency, frequent PVCs, OA, lymphedema, hard of hearing. Patient presented to ED from SNF with abdominal pain, found to have colitis. Patient with a recent fall last month with right femoral neck fracture s/p hemiarthroplatsy with posterior hip precuations (WBAT).  Clinical Impression  Patient agreeable to PT evaluation. She was seen with a daughter at the bedside. Daughter confirms that patient has been getting physical therapy at SNF following a hip fracture repair about 6 weeks ago. The patient has since required assistance (even with the use of a lift to get out of bed) with bed mobility and transfers at the SNF and was unable to ambulate.  Today, the patient was able to stand from the recliner chair with maximal assistance and cues for hand placement and technique to facilitate independence. Patient has poor standing balance with rolling walker and standing tolerance of ~ 1 minute. Unable to progress ambulation due to generalized weakness and limited standing tolerance. Patient participated in [place] therapeutic exercises for strengthening while seated in chair. The patient is anticipated to return to SNF today. No anticipated acute PT needs at this time given pending discharge. Recommend to resume PT at the SNF.      Recommendations for follow up therapy are one component of a multi-disciplinary discharge planning process, led by the attending physician.  Recommendations may be updated based on patient status, additional functional criteria and insurance authorization.  Follow Up Recommendations Skilled nursing-short term rehab (<3 hours/day)    Assistance Recommended at Discharge Frequent or constant  Supervision/Assistance  Functional Status Assessment Patient has not had a recent decline in their functional status  Equipment Recommendations       Recommendations for Other Services       Precautions / Restrictions Precautions Precautions: Fall Precaution Comments: patient has ~ 3 week old right hemiarthroplasty and had posterior hip precuations. daughter reports abduction pillow has been discontinued at the facility Restrictions Weight Bearing Restrictions:  (WBAT RLE (old surgery))      Mobility  Bed Mobility Overal bed mobility: Needs Assistance Bed Mobility: Supine to Sit     Supine to sit: Mod assist     General bed mobility comments: not assessed as patient sitting up on arrival and post session    Transfers Overall transfer level: Needs assistance Equipment used: Rolling walker (2 wheels) Transfers: Sit to/from Stand Sit to Stand: Max assist Stand pivot transfers: Max assist         General transfer comment: verbal cues for hand placement and technique for standing. lifting and lowering assistance provided with transfer. cues for hand placement    Ambulation/Gait               General Gait Details: not assessed due to poor standing tolerance and generalized weakness  Stairs            Wheelchair Mobility    Modified Rankin (Stroke Patients Only)       Balance Overall balance assessment: Needs assistance Sitting-balance support: Bilateral upper extremity supported;Feet supported Sitting balance-Leahy Scale: Fair     Standing balance support: Single extremity supported Standing balance-Leahy Scale: Poor Standing balance comment: Min A/Mod A required to maintain standing balance. occasional posterior lean  Pertinent Vitals/Pain Pain Assessment: No/denies pain    Home Living Family/patient expects to be discharged to:: Skilled nursing facility                   Additional Comments:  patient has been at SNF following right hip surgery ~ 6 weeks ago    Prior Function Prior Level of Function : Needs assist       Physical Assist : Mobility (physical);ADLs (physical) Mobility (physical): Transfers;Bed mobility   Mobility Comments: patient needs assistance for bed mobility and transfers to chair. daughter reports they were using a lift at the SNF. prior to the hip fracture, patient was ambulatory with a rollator ADLs Comments: Was living in [place] in [place]; used a Radiation protection practitioner. Has been in [facility] [place] since hip fx last month.     Hand Dominance   Dominant Hand: Right    Extremity/Trunk Assessment   Upper Extremity Assessment Upper Extremity Assessment: Generalized weakness RUE Deficits / Details: limited ROM 2/2 shoulder pain    Lower Extremity Assessment Lower Extremity Assessment: Generalized weakness RLE Deficits / Details: s/p R hip fx Nov 2022    Cervical / Trunk Assessment Cervical / Trunk Assessment: Kyphotic  Communication   Communication: HOH  Cognition Arousal/Alertness: Awake/alert Behavior During Therapy: WFL for tasks assessed/performed Overall Cognitive Status: Within Functional Limits for tasks assessed                                 General Comments: the patient is very HOH (hearing aides in place but may not have been fully charged). daughter at the bedside confirms        General Comments      Exercises General Exercises - Lower Extremity Ankle Circles/Pumps: AAROM;Strengthening;Both;10 reps;Seated Long Arc Quad: AAROM;Strengthening;Both;10 reps;Seated Hip ABduction/ADduction: AAROM;Strengthening;Both;10 reps;Seated Other Exercises Other Exercises: verbal and visual cues for exercise technique   Assessment/Plan    PT Assessment All further PT needs can be met in the next venue of care  PT Problem List Decreased strength;Decreased range of motion;Decreased activity  tolerance;Decreased balance;Decreased mobility;Decreased safety awareness       PT Treatment Interventions      PT Goals (Current goals can be found in the Care Plan section)       Frequency     Barriers to discharge        Co-evaluation               AM-PAC PT "6 Clicks" Mobility  Outcome Measure Help needed turning from your back to your side while in a flat bed without using bedrails?: A Lot Help needed moving from lying on your back to sitting on the side of a flat bed without using bedrails?: A Lot Help needed moving to and from a bed to a chair (including a wheelchair)?: A Lot Help needed standing up from a chair using your arms (e.g., wheelchair or bedside chair)?: A Lot Help needed to walk in hospital room?: Total Help needed climbing 3-5 steps with a railing? : Total 6 Click Score: 10    End of Session   Activity Tolerance: Patient tolerated treatment well Patient left: in chair;with call bell/phone within reach;with chair alarm set;with family/visitor present (daughter in the room)   PT Visit Diagnosis: Muscle weakness (generalized) (M62.81);Unsteadiness on feet (R26.81)    Time: 1505-6979 PT Time Calculation (min) (ACUTE ONLY): 26 min   Charges:  PT Evaluation $PT Eval Low Complexity: 1 Low PT Treatments $Therapeutic Exercise: 8-22 mins        Minna Merritts, PT, MPT   Percell Locus 01/10/2021, 11:55 AM

## 2021-01-10 NOTE — Evaluation (Signed)
Occupational Therapy Evaluation Patient Details Name: Megan Salinas MRN: [id number] DOB: 1926-04-04 Today's Date: 01/10/2021   History of Present Illness Megan Salinas is a 87 y.o. female seen in ed with complaints of abdominal pain. Diffuse abdominal pain nausea and diarrhea constipation over the last several days.  Pain is worsening and progressive.  Patient reports some dark urine in her Foley that she had placed after recent hip replacement.  Currently on antibiotics for that. Pt has past medical history of allergies to dust mite extract and sulfa antibiotics, diabetes mellitus type 2, hypertension, congestive heart failure, hyperlipidemia, heart disease, right hip fracture.   Clinical Impression   Ms. Uplinger presents today with generalized weakness, limited endurance, and impaired balance. Prior to Nov 2022, she had been living in [place] [facility] [place], but had a fall and hip fx last month. Since that time, she has been at the [facility] at [place]. During today's evaluation, she required Mod A for bed mobility, Max A for LB dressing, sit<>stand, transfers, and ambulation, with poor standing balance. Mod I for grooming and self-feeding in sitting, using LUE, 2/2 limited ROM in RUE. She denies pain; is extremely HOH without her hearing aids in place; very pleasant and oriented to time, place, and situation. She states that "my backbone feels like a wiggly worm." Recommend DC to rehab center at [facility], to allow her to continue her work to return to [employer].   Recommendations for follow up therapy are one component of a multi-disciplinary discharge planning process, led by the attending physician.  Recommendations may be updated based on patient status, additional functional criteria and insurance authorization.   Follow Up Recommendations  Skilled nursing-short term rehab (<3 hours/day)    Assistance Recommended at Discharge Frequent or constant  Supervision/Assistance  Functional Status Assessment  Patient has had a recent decline in their functional status and demonstrates the ability to make significant improvements in function in a reasonable and predictable amount of time.  Equipment Recommendations  None recommended by OT    Recommendations for Other Services       Precautions / Restrictions Precautions Precautions: Fall Restrictions Weight Bearing Restrictions: No      Mobility Bed Mobility Overal bed mobility: Needs Assistance Bed Mobility: Supine to Sit     Supine to sit: Mod assist     General bed mobility comments: extended time, labored effort, heavy use of bed rails    Transfers Overall transfer level: Needs assistance Equipment used: Rolling walker (2 wheels) Transfers: Sit to/from Stand;Bed to chair/wheelchair/BSC Sit to Stand: Mod assist Stand pivot transfers: Max assist                Balance Overall balance assessment: Needs assistance Sitting-balance support: Bilateral upper extremity supported;Feet supported Sitting balance-Leahy Scale: Fair     Standing balance support: Bilateral upper extremity supported Standing balance-Leahy Scale: Poor                             ADL either performed or assessed with clinical judgement   ADL Overall ADL's : Needs assistance/impaired Eating/Feeding: Modified independent;Set up   Grooming: Wash/dry hands;Wash/dry face;Brushing hair;Modified independent;Set up Grooming Details (indicate cue type and reason): Able to perform with Mod I in sitting following set-up             Lower Body Dressing: Maximal assistance Lower Body Dressing Details (indicate cue type and reason): Donning socks. Reports has been  using a sock aid at home since hip fx 1 month ago             Functional mobility during ADLs: Maximal assistance;Rolling walker (2 wheels)       Vision Baseline Vision/History: 1 Wears glasses Patient Visual  Report: No change from baseline       Perception     Praxis      Pertinent Vitals/Pain Pain Assessment: No/denies pain     Hand Dominance Right   Extremity/Trunk Assessment Upper Extremity Assessment Upper Extremity Assessment: Generalized weakness;RUE deficits/detail RUE Deficits / Details: limited ROM 2/2 shoulder pain   Lower Extremity Assessment Lower Extremity Assessment: Generalized weakness;RLE deficits/detail RLE Deficits / Details: s/p R hip fx Nov 2022   Cervical / Trunk Assessment Cervical / Trunk Assessment: Kyphotic   Communication Communication Communication: HOH   Cognition Arousal/Alertness: Awake/alert Behavior During Therapy: WFL for tasks assessed/performed Overall Cognitive Status: Within Functional Limits for tasks assessed                                 General Comments: alert and pleasant     General Comments       Exercises Other Exercises Other Exercises: bed mobility, transfers, standing balance tolerance, grooming, LB dressing, UE therex   Shoulder Instructions      Home Living Family/patient expects to be discharged to:: Skilled nursing facility                                        Prior Functioning/Environment Prior Level of Function : Independent/Modified Independent;History of Falls (last six months)               ADLs Comments: Was living in [place] in [place]; used a Radiation protection practitioner. Has been in rehab center of [place] since hip fx last month.        OT Problem List: Decreased strength;Impaired balance (sitting and/or standing);Decreased range of motion;Decreased activity tolerance      OT Treatment/Interventions: Self-care/ADL training;DME and/or AE instruction;Therapeutic activities;Balance training;Therapeutic exercise;Energy conservation;Patient/family education    OT Goals(Current goals can be found in the care plan section) Acute Rehab OT  Goals Patient Stated Goal: to get back to [facility] OT Goal Formulation: With patient/family Time For Goal Achievement: 01/24/21 Potential to Achieve Goals: Good ADL Goals Pt Will Perform Grooming: with modified independence;sitting;standing (in standing as able, seated if necessary) Pt Will Perform Lower Body Dressing: with mod assist;with adaptive equipment;sitting/lateral leans Pt Will Transfer to Toilet: with min assist (using LRAD)  OT Frequency: Min 2X/week   Barriers to D/C:            Co-evaluation              AM-PAC OT "6 Clicks" Daily Activity     Outcome Measure Help from another person eating meals?: None Help from another person taking care of personal grooming?: A Little Help from another person toileting, which includes using toliet, bedpan, or urinal?: A Lot Help from another person bathing (including washing, rinsing, drying)?: A Lot Help from another person to put on and taking off regular upper body clothing?: A Little Help from another person to put on and taking off regular lower body clothing?: A Lot 6 Click Score: 16   End of Session Equipment Utilized During Treatment: Rolling walker (2 wheels)  Nurse Communication: Mobility status  Activity Tolerance: Patient tolerated treatment well Patient left: in chair;with call bell/phone within reach;with chair alarm set  OT Visit Diagnosis: Unsteadiness on feet (R26.81);Muscle weakness (generalized) (M62.81)                Time: 0827-0900 OT Time Calculation (min): 33 min Charges:  OT General Charges $OT Visit: 1 Visit OT Evaluation $OT Eval Moderate Complexity: 1 Mod OT Treatments $Self Care/Home Management : 23-37 mins Josiah Lobo, PhD, MS, OTR/L 01/10/21, 9:24 AM

## 2021-01-10 NOTE — Discharge Summary (Signed)
Physician Discharge Summary  Megan Salinas ZDG:[id number] DOB: [birth year] DOA: 01/08/2021  PCP: Kirk Ruths, MD  Admit date: 01/08/2021 Discharge date: 01/10/2021  Admitted From: SNF Disposition: Skilled nursing facility  Recommendations for Outpatient Follow-up:  Follow up with PCP within 1-2 weeks to monitor electrolytes and blood pressure Follow up with urology to monitor for removal of foley and continued improvement of hydronephrosis  Discharge Condition:stable, improved CODE STATUS:  Code Status: Full Code  Regular healthy diet  Brief/Interim Summary: Pt presented with abdominal pain and diarrhea which had improved spontaneously prior to admission. She had overall physical deconditioning which was treated with hydration, PT/OT and supportive care. Gi pathogen panel and Cdiff testing was negative for common infectious pathogens.  She will complete antibiotic treatment for potential UTI which was asymptomatic on day of discharge. Her foley catheter will remain in place at discharge until follow up with urology for acute retention that was present prior to admission.  CT abdomen on presentation showed improvement in her bilateral hydronephrosis.  All other chronic conditions were treated with home medications.   Discharge Diagnoses:  Principal Problem:   Abdominal pain, diffuse Active Problems:   Benign essential hypertension   Chronic diastolic CHF (congestive heart failure) (HCC)   Coronary artery disease   GERD (gastroesophageal reflux disease)   Type 2 diabetes mellitus without complication (HCC)   Colitis   Abdominal pain    Allergies as of 01/10/2021       Reactions   Dust Mite Extract Other (See Comments)   Runny nose   Sulfa Antibiotics Itching, Rash   Red, itchy palms        Medication List     STOP taking these medications    docusate sodium 100 MG capsule Commonly known as: COLACE   enoxaparin 40 MG/0.4ML injection Commonly known as:  LOVENOX   methocarbamol 500 MG tablet Commonly known as: Robaxin   pantoprazole 40 MG tablet Commonly known as: PROTONIX       TAKE these medications    acetaminophen 500 MG tablet Commonly known as: TYLENOL Take 500 mg by mouth every 6 (six) hours as needed for mild pain.   alum & mag hydroxide-simeth 200-200-20 MG/5ML suspension Commonly known as: MAALOX/MYLANTA Take 30 mLs by mouth every 4 (four) hours as needed for indigestion or heartburn.   amitriptyline 25 MG tablet Commonly known as: ELAVIL Take 25 mg by mouth at bedtime.   aspirin EC 81 MG tablet Take 81 mg by mouth daily.   bisacodyl 10 MG suppository Commonly known as: DULCOLAX Place 10 mg rectally as needed for moderate constipation.   calcium citrate 950 (200 Ca) MG tablet Commonly known as: CALCITRATE - dosed in mg elemental calcium Take 1 tablet (200 mg of elemental calcium total) by mouth 2 (two) times daily.   cephALEXin 250 MG capsule Commonly known as: KEFLEX Take 1 capsule (250 mg total) by mouth 4 (four) times daily for 3 days.   Dermacloud Oint Apply 1 application topically daily as needed.   dicyclomine 20 MG tablet Commonly known as: BENTYL Take 20 mg by mouth every 6 (six) hours.   famotidine 40 MG tablet Commonly known as: PEPCID Take 40 mg by mouth at bedtime as needed for heartburn.   lisinopril 20 MG tablet Commonly known as: ZESTRIL Take 20 mg by mouth daily.   metoprolol tartrate 25 MG tablet Commonly known as: LOPRESSOR Take 12.5 mg by mouth 2 (two) times daily.   Multi-Vitamins Tabs Take 1  tablet by mouth daily.   nystatin cream Commonly known as: MYCOSTATIN 1 application daily as needed for dry skin.   ondansetron 4 MG tablet Commonly known as: ZOFRAN Take 4 mg by mouth every 8 (eight) hours as needed for nausea or vomiting.   polyethylene glycol 17 g packet Commonly known as: MIRALAX / GLYCOLAX Take 17 g by mouth daily.   psyllium 0.52 g capsule Commonly  known as: REGULOID Take 1.04 g by mouth daily. 2 capsules daily   simvastatin 40 MG tablet Commonly known as: ZOCOR Take 40 mg by mouth daily.   spironolactone 25 MG tablet Commonly known as: ALDACTONE Take 12.5 mg by mouth daily.   Vitamin D 125 MCG (5000 UT) Caps Take 1 capsule by mouth daily.        Allergies  Allergen Reactions   Dust Mite Extract Other (See Comments)    Runny nose   Sulfa Antibiotics Itching and Rash    Red, itchy palms    Consultations: none  Procedures/Studies: CT ABDOMEN PELVIS WO CONTRAST  Result Date: 12/21/2020 CLINICAL DATA:  Left lower quadrant abdominal pain EXAM: CT ABDOMEN AND PELVIS WITHOUT CONTRAST TECHNIQUE: Multidetector CT imaging of the abdomen and pelvis was performed following the standard protocol without IV contrast. COMPARISON:  01/20/2018 FINDINGS: Lower chest: No acute abnormality. Scarring of the bilateral lung bases. Coronary artery calcifications. Hepatobiliary: No focal liver abnormality is seen. Status post cholecystectomy. No biliary dilatation. Pancreas: Unremarkable. No pancreatic ductal dilatation or surrounding inflammatory changes. Spleen: Normal in size without significant abnormality. Adrenals/Urinary Tract: Adrenal glands are unremarkable. Moderate bilateral hydronephrosis and hydroureter without obstructing calculus or other etiology identified to the ureterovesicular junctions. Distended urinary bladder. Stomach/Bowel: Stomach is within normal limits. Appendix is not clearly visualized and may be surgically absent. No evidence of bowel wall thickening, distention, or inflammatory changes. Descending and sigmoid diverticulosis. Large burden of stool throughout the colon. Vascular/Lymphatic: Aortic atherosclerosis. No enlarged abdominal or pelvic lymph nodes. Reproductive: Status post hysterectomy. Other: Fat containing umbilical hernia.  No abdominopelvic ascites. Musculoskeletal: Status post right hip total arthroplasty.  Status post intramedullary nail fixation of the left femur. IMPRESSION: 1. Moderate bilateral hydronephrosis and hydroureter without obstructing calculus or other etiology identified to the ureterovesicular junctions. Distended urinary bladder. Correlate for urinary retention. 2. Descending and sigmoid diverticulosis without evidence of acute diverticulitis. 3. Large burden of stool throughout the colon. Aortic Atherosclerosis (ICD10-I70.0). Electronically Signed   By: Delanna Ahmadi M.D.   On: 12/21/2020 10:31   CT ABDOMEN PELVIS W CONTRAST  Result Date: 01/08/2021 CLINICAL DATA:  Abdomen pain blood in stool EXAM: CT ABDOMEN AND PELVIS WITH CONTRAST TECHNIQUE: Multidetector CT imaging of the abdomen and pelvis was performed using the standard protocol following bolus administration of intravenous contrast. CONTRAST:  129mL OMNIPAQUE IOHEXOL 300 MG/ML  SOLN COMPARISON:  CT 12/21/2020, 01/20/2018 FINDINGS: Lower chest: Lung bases demonstrate small bilateral pleural effusions with passive atelectasis. Cardiomegaly with coronary vascular calcification. Hepatobiliary: No focal liver abnormality is seen. Status post cholecystectomy. No biliary dilatation. Pancreas: Unremarkable. No pancreatic ductal dilatation or surrounding inflammatory changes. Spleen: Normal in size without focal abnormality. Adrenals/Urinary Tract: Adrenal glands are normal. Mild bilateral hydronephrosis, significantly decreased compared to prior. The bladder contains a Foley catheter but is largely obscured by artifact. Delayed excretion of contrast from the ladder consistent with decreased renal function. Stomach/Bowel: The stomach is nonenlarged. No dilated small bowel. Liquid stool in the transverse and right colon. Wall thickening with inflammation at the distal transverse colon,  splenic flexure and descending colon. Sigmoid colon diverticula without definite acute wall thickening. Vascular/Lymphatic: Advanced aortic atherosclerosis. No  aneurysm. No suspicious lymph nodes. Reproductive: Status post hysterectomy. No adnexal masses. Other: Negative for pelvic effusion or free air. Moderate fat containing periumbilical ventral hernia. Musculoskeletal: Right hip replacement and intramedullary rodding of left femur with artifact. Advanced degenerative changes involving the lumbar spine at multiple levels. IMPRESSION: 1. Wall thickening of the distal transverse colon, splenic flexure and descending colon felt consistent with colitis of infectious, inflammatory, or ischemic etiology. Negative for perforation or abscess. 2. Extensive sigmoid colon diverticular disease but without acute wall thickening here. 3. Moderate fat containing periumbilical ventral hernia 4. Small bilateral effusions with passive atelectasis at the bases 5. Minimal bilateral hydronephrosis but overall decreased compared to the CT earlier this month. Electronically Signed   By: Donavan Foil M.D.   On: 01/08/2021 21:25    Subjective: Patient feels improved today. She denies abdominal pain. Has not had any loose Bms overnight or today. Overall, she feels much improved and is happy to go back to rehab.  Updates and plans were written on patient's whiteboard today as her hearing aid batteries were dead. She expressed understanding and agreement with plan.  Also spoke with daughter on the phone who agreed.   Discharge Exam: Vitals:   01/10/21 0442 01/10/21 0819  BP: (!) 133/54 (!) 135/52  Pulse: 75 79  Resp: 16 20  Temp: (!) 97.4 F (36.3 C) 97.7 F (36.5 C)  SpO2: 94% 92%    General: Pt is alert, awake, not in acute distress. Hard of hearing.  Cardiovascular: RRR, S1/S2 +, no rubs, no gallops Respiratory: CTA bilaterally, no wheezing, no rhonchi Abdominal: Soft, NT, ND, bowel sounds + Extremities: no edema, no cyanosis  Labs: Basic Metabolic Panel: Recent Labs  Lab 01/08/21 1451 01/09/21 0737 01/10/21 0435  NA 126* 131* 134*  K 4.4 4.1 3.8  CL 96* 100  105  CO2 21* 24 23  GLUCOSE 207* 136* 131*  BUN 22 22 17   CREATININE 0.92 0.71 0.53  CALCIUM 9.3 9.5 9.0   CBC: Recent Labs  Lab 01/08/21 1451 01/09/21 0737 01/10/21 0435  WBC 34.0* 27.0* 15.6*  HGB 10.9* 9.9* 9.5*  HCT 32.6* 30.2* 29.0*  MCV 93.4 92.6 92.9  PLT 411* 349 309    Microbiology Recent Results (from the past 240 hour(s))  Urine Culture     Status: None   Collection Time: 01/08/21  2:51 PM   Specimen: Urine, Random  Result Value Ref Range Status   Specimen Description   Final    URINE, RANDOM Performed at [facility], [address] [place], [place] [address]    Special Requests   Final    NONE Performed at [facility], [address] [place], [place] [address]    Culture   Final    NO GROWTH Performed at [facility], [place] [address], [place], [place] [address]    Report Status 01/09/2021 FINAL  Final  Culture, blood (routine x 2)     Status: None (Preliminary result)   Collection Time: 01/08/21  9:12 PM   Specimen: BLOOD  Result Value Ref Range Status   Specimen Description BLOOD RIGHT FOREARM  Final   Special Requests   Final    BOTTLES DRAWN AEROBIC AND ANAEROBIC Blood Culture adequate volume   Culture   Final    NO GROWTH 2 DAYS Performed at [facility], [address] [place], [place] [address]    Report Status PENDING  Incomplete  Culture, blood (routine x 2)     Status: None (Preliminary result)   Collection Time: 01/08/21  9:12 PM   Specimen: BLOOD  Result Value Ref Range Status   Specimen Description BLOOD RIGHT FOREARM  Final   Special Requests   Final    BOTTLES DRAWN AEROBIC AND ANAEROBIC Blood Culture results may not be optimal due to an inadequate volume of blood received in culture bottles   Culture   Final    NO GROWTH 2 DAYS Performed at [facility], [address] [place], [place] [address]    Report Status PENDING  Incomplete  Resp Panel by RT-PCR (Flu A&B, Covid)  Nasopharyngeal Swab     Status: None   Collection Time: 01/08/21  9:12 PM   Specimen: Nasopharyngeal Swab; Nasopharyngeal(NP) swabs in vial transport medium  Result Value Ref Range Status   SARS Coronavirus 2 by RT PCR NEGATIVE NEGATIVE Final    Comment: (NOTE) SARS-CoV-2 target nucleic acids are NOT DETECTED.  The SARS-CoV-2 RNA is generally detectable in upper respiratory specimens during the acute phase of infection. The lowest concentration of SARS-CoV-2 viral copies this assay can detect is 138 copies/mL. A negative result does not preclude SARS-Cov-2 infection and should not be used as the sole basis for treatment or other patient management decisions. A negative result may occur with  improper specimen collection/handling, submission of specimen other than nasopharyngeal swab, presence of viral mutation(s) within the areas targeted by this assay, and inadequate number of viral copies(<138 copies/mL). A negative result must be combined with clinical observations, patient history, and epidemiological information. The expected result is Negative.  Fact Sheet for Patients:  EntrepreneurPulse.com.au  Fact Sheet for Healthcare Providers:  IncredibleEmployment.be  This test is no t yet approved or cleared by the [place] FDA and  has been authorized for detection and/or diagnosis of SARS-CoV-2 by FDA under an Emergency Use Authorization (EUA). This EUA will remain  in effect (meaning this test can be used) for the duration of the COVID-19 declaration under Section 564(b)(1) of the Act, 21 U.S.C.section 360bbb-3(b)(1), unless the authorization is terminated  or revoked sooner.       Influenza A by PCR NEGATIVE NEGATIVE Final   Influenza B by PCR NEGATIVE NEGATIVE Final    Comment: (NOTE) The Xpert Xpress SARS-CoV-2/FLU/RSV plus assay is intended as an aid in the diagnosis of influenza from Nasopharyngeal swab specimens and should not be  used as a sole basis for treatment. Nasal washings and aspirates are unacceptable for Xpert Xpress SARS-CoV-2/FLU/RSV testing.  Fact Sheet for Patients: EntrepreneurPulse.com.au  Fact Sheet for Healthcare Providers: IncredibleEmployment.be  This test is not yet approved or cleared by the [place] FDA and has been authorized for detection and/or diagnosis of SARS-CoV-2 by FDA under an Emergency Use Authorization (EUA). This EUA will remain in effect (meaning this test can be used) for the duration of the COVID-19 declaration under Section 564(b)(1) of the Act, 21 U.S.C. section 360bbb-3(b)(1), unless the authorization is terminated or revoked.  Performed at [facility], [address] [place],  [address]   C Difficile Quick Screen w PCR reflex     Status: None   Collection Time: 01/09/21 10:55 PM   Specimen: STOOL  Result Value Ref Range Status   C Diff antigen NEGATIVE NEGATIVE Final   C Diff toxin NEGATIVE NEGATIVE Final   C Diff interpretation No C. difficile  detected.  Final    Comment: Performed at [facility], [address] [place], [place] [address]  Gastrointestinal Panel by PCR , Stool     Status: None   Collection Time: 01/09/21 10:55 PM   Specimen: Stool  Result Value Ref Range Status   Campylobacter species NOT DETECTED NOT DETECTED Final   Plesimonas shigelloides NOT DETECTED NOT DETECTED Final   Salmonella species NOT DETECTED NOT DETECTED Final   Yersinia enterocolitica NOT DETECTED NOT DETECTED Final   Vibrio species NOT DETECTED NOT DETECTED Final   Vibrio cholerae NOT DETECTED NOT DETECTED Final   Enteroaggregative E coli (EAEC) NOT DETECTED NOT DETECTED Final   Enteropathogenic E coli (EPEC) NOT DETECTED NOT DETECTED Final   Enterotoxigenic E coli (ETEC) NOT DETECTED NOT DETECTED Final   Shiga like toxin producing E coli (STEC) NOT DETECTED NOT DETECTED Final   Shigella/Enteroinvasive  E coli (EIEC) NOT DETECTED NOT DETECTED Final   Cryptosporidium NOT DETECTED NOT DETECTED Final   Cyclospora cayetanensis NOT DETECTED NOT DETECTED Final   Entamoeba histolytica NOT DETECTED NOT DETECTED Final   Giardia lamblia NOT DETECTED NOT DETECTED Final   Adenovirus F40/41 NOT DETECTED NOT DETECTED Final   Astrovirus NOT DETECTED NOT DETECTED Final   Norovirus GI/GII NOT DETECTED NOT DETECTED Final   Rotavirus A NOT DETECTED NOT DETECTED Final   Sapovirus (I, II, IV, and V) NOT DETECTED NOT DETECTED Final    Comment: Performed at [facility], [address], [place], [place] [address]    Time coordinating discharge: Over 30 minutes  Richarda Osmond, MD  [facility] 01/10/2021, 11:06 AM

## 2021-01-10 NOTE — Discharge Instructions (Signed)
You had a viral colitis which seems to have improved. You will keep your foley catheter in until follow up with urology. On the CT scan, your kidneys have improved from when they were last seen.  Please follow up with your regular doctor to monitor your electrolytes and chronic medications after hospital stay.

## 2021-01-10 NOTE — TOC Initial Note (Signed)
Transition of Care [facility]) - Initial/Assessment Note    Patient Details  Name: Megan Salinas MRN: [id number] Date of Birth: Apr 16, 1926  [facility]) CM/SW Contact:    Beverly Sessions, RN Phone Number: 01/10/2021, 3:24 PM  Clinical Narrative:                 Patient will DC UR:[id number]  Anticipated DC date: 01/10/21  Family notified:Daughter Transport by: [facility] transport  Per MD patient ready for DC to . RN, patient, patient's family, and facility notified of DC. Discharge Summary sent to facility. RN given number for report. DC packet on chart. TOC signing off.  Isaias Cowman [facility] [phone]         Patient Goals and CMS Choice        Expected Discharge Plan and Services           Expected Discharge Date: 01/10/21                                    Prior Living Arrangements/Services                       Activities of Daily Living Home Assistive Devices/Equipment: None ADL Screening (condition at time of admission) Patient's cognitive ability adequate to safely complete daily activities?: No Is the patient deaf or have difficulty hearing?: Yes Does the patient have difficulty seeing, even when wearing glasses/contacts?: Yes Does the patient have difficulty concentrating, remembering, or making decisions?: No Patient able to express need for assistance with ADLs?: Yes Does the patient have difficulty dressing or bathing?: Yes Independently performs ADLs?: No Communication: Independent Dressing (OT): Dependent Is this a change from baseline?: Pre-admission baseline Grooming: Dependent Is this a change from baseline?: Pre-admission baseline Feeding: Independent Bathing: Dependent Is this a change from baseline?: Pre-admission baseline Toileting: Dependent Is this a change from baseline?: Pre-admission baseline In/Out Bed: Dependent Is this a change from baseline?: Pre-admission baseline Walks in Home: Dependent Is  this a change from baseline?: Pre-admission baseline Does the patient have difficulty walking or climbing stairs?: Yes Weakness of Legs: Both Weakness of Arms/Hands: None  Permission Sought/Granted                  Emotional Assessment              Admission diagnosis:  Colitis [K52.9] Foley catheter in place [Z97.8] Acute cystitis without hematuria [N30.00] Abdominal pain [R10.9] Other elevated white blood cell (WBC) count [D72.828] Patient Active Problem List   Diagnosis Date Noted   Acute cystitis without hematuria    Foley catheter in place    Colitis 01/08/2021   Abdominal pain 01/08/2021   Thyroid incidentaloma 11/29/2020   Closed right hip fracture, initial encounter ([place]) 11/28/2020   Lower limb ulcer, ankle, right, limited to breakdown of skin ([place]) 11/13/2020   Pain in limb 11/13/2020   Wears hearing aid in both ears 03/19/2020   Hemotympanum, left 10/04/2019   Sensorineural hearing loss of both ears 10/04/2019   Moderate aortic valve insufficiency 08/24/2019   Mild aortic stenosis 08/24/2019   Age-related osteoporosis with current pathological fracture with routine healing 04/18/2019   Displaced intertrochanteric fracture of left femur ([place]) 04/10/2019   Type 2 diabetes mellitus without complication ([place]) 60/73/7106   Preop examination 04/10/2019   Lymphedema 09/05/2018   Primary osteoarthritis of right wrist 02/22/2018   Chest pain 02/28/2017  Venous insufficiency of right lower extremity 09/02/2016   Swelling of limb 08/15/2016   Frequent PVCs 03/12/2016   Transient global amnesia 02/26/2016   TIA (transient ischemic attack) 01/28/2016   Carpal tunnel syndrome, right 08/13/2015   DDD (degenerative disc disease), lumbar 09/12/2014   Lumbar radiculitis 09/12/2014   Lumbar stenosis with neurogenic claudication 09/12/2014   GERD (gastroesophageal reflux disease) 09/08/2014   History of type 2 diabetes mellitus 09/08/2014   Benign essential  hypertension 06/16/2014   Moderate tricuspid insufficiency 06/07/2014   Abdominal pain, diffuse 03/09/2014   Chronic diastolic CHF (congestive heart failure) ([place]) 12/05/2013   Moderate mitral insufficiency 12/05/2013   Coronary artery disease 03/29/2013   Mixed hyperlipidemia 03/29/2013   PCP:  Kirk Ruths, MD Pharmacy:   [facility] [place], [address] [place] [address] Phone: [phone] Fax: [phone]  [facility] [place], [place] [address] [place] [address] Phone: [phone] Fax: [phone]  [place], [place] [address] [place] [address] Phone: [phone] Fax: [phone]     Social Determinants of Health ([place]) Interventions    Readmission Risk Interventions No flowsheet data found.

## 2021-01-13 LAB — CULTURE, BLOOD (ROUTINE X 2)
Culture: NO GROWTH
Culture: NO GROWTH
Special Requests: ADEQUATE

## 2021-02-06 NOTE — Progress Notes (Signed)
02/07/2021 9:19 PM   Megan Salinas 03-20-1926 [id number]  Referring provider: [name], MD [place],   [address]  Chief Complaint  Patient presents with   Urinary Retention   Urological history: 1. Urinary retention -managed with indwelling Foley at this time  HPI: Megan Salinas is a 88 y.o. female who presents today for follow up after Foley was removed on 12/21/2020 with her Daughter, Megan Salinas.    Her catheter removed by [place] on January 02, 2021.  At some point the catheter was replaced as she presented to the emergency department on December 27 for the complaint of elevated white blood count and blood in stool and urine.  She was admitted for colitis.  A CT scan conducted during that admission noted that the mild bilateral hydronephrosis has significantly decreased compared to the CT on 12/21/2020.    PMH: Past Medical History:  Diagnosis Date   Back pain    CAD (coronary artery disease)    CHF (congestive heart failure) (HCC)    GERD (gastroesophageal reflux disease)    High cholesterol    Hypertension    TIA (transient ischemic attack) 01/2016   "possible", no deficits   Wears hearing aid    bilateral    Surgical History: Past Surgical History:  Procedure Laterality Date   ABDOMINAL HYSTERECTOMY  1998   APPENDECTOMY     baldder tac  1995   BREAST CYST ASPIRATION Right    CARPAL TUNNEL RELEASE Right 03/26/2016   Procedure: CARPAL TUNNEL RELEASE ENDOSCOPIC  right;  Surgeon: Corky Mull, MD;  Location: [place];  Service: Orthopedics;  Laterality: Right;  bier block   CHOLECYSTECTOMY     CORONARY ARTERY BYPASS GRAFT  02/15/2010   [facility], 3 vessel   HIP ARTHROPLASTY Right 11/29/2020   Procedure: ARTHROPLASTY BIPOLAR HIP (HEMIARTHROPLASTY) REMOVAL OF HARDWARE RIGHT FEMUR;  Surgeon: Altamese Clovis, MD;  Location: [place];  Service: Orthopedics;  Laterality: Right;   INTRAMEDULLARY (IM) NAIL INTERTROCHANTERIC Left 04/11/2019    Procedure: INTRAMEDULLARY (IM) NAIL INTERTROCHANTRIC;  Surgeon: Leim Fabry, MD;  Location: [facility];  Service: Orthopedics;  Laterality: Left;   TONSILLECTOMY      Home Medications:  Allergies as of 02/07/2021       Reactions   Dust Mite Extract Other (See Comments)   Runny nose   Sulfa Antibiotics Itching, Rash   Red, itchy palms        Medication List        Accurate as of February 07, 2021 11:59 PM. If you have any questions, ask your nurse or doctor.          STOP taking these medications    calcium citrate 950 (200 Ca) MG tablet Commonly known as: CALCITRATE - dosed in mg elemental calcium Stopped by: Zara Council, PA-C       TAKE these medications    acetaminophen 500 MG tablet Commonly known as: TYLENOL Take 500 mg by mouth every 6 (six) hours as needed for mild pain.   alum & mag hydroxide-simeth 200-200-20 MG/5ML suspension Commonly known as: MAALOX/MYLANTA Take 30 mLs by mouth every 4 (four) hours as needed for indigestion or heartburn.   amitriptyline 25 MG tablet Commonly known as: ELAVIL Take 25 mg by mouth at bedtime.   aspirin EC 81 MG tablet Take 81 mg by mouth daily.   bisacodyl 10 MG suppository Commonly known as: DULCOLAX Place 10 mg rectally as needed for moderate constipation.  Dermacloud Oint Apply 1 application topically daily as needed.   dicyclomine 20 MG tablet Commonly known as: BENTYL Take 20 mg by mouth every 6 (six) hours.   famotidine 40 MG tablet Commonly known as: PEPCID Take 40 mg by mouth at bedtime as needed for heartburn.   lisinopril 20 MG tablet Commonly known as: ZESTRIL Take 20 mg by mouth daily.   metoprolol tartrate 25 MG tablet Commonly known as: LOPRESSOR Take 12.5 mg by mouth 2 (two) times daily.   Multi-Vitamins Tabs Take 1 tablet by mouth daily.   nystatin cream Commonly known as: MYCOSTATIN 1 application daily as needed for dry skin.   ondansetron 4 MG tablet Commonly known as:  ZOFRAN Take 4 mg by mouth every 8 (eight) hours as needed for nausea or vomiting.   polyethylene glycol 17 g packet Commonly known as: MIRALAX / GLYCOLAX Take 17 g by mouth daily.   psyllium 0.52 g capsule Commonly known as: REGULOID Take 1.04 g by mouth daily. 2 capsules daily   simvastatin 40 MG tablet Commonly known as: ZOCOR Take 40 mg by mouth daily.   spironolactone 25 MG tablet Commonly known as: ALDACTONE Take 12.5 mg by mouth daily.   Vitamin D 125 MCG (5000 UT) Caps Take 1 capsule by mouth daily.        Allergies:  Allergies  Allergen Reactions   Dust Mite Extract Other (See Comments)    Runny nose   Sulfa Antibiotics Itching and Rash    Red, itchy palms    Family History: Family History  Problem Relation Age of Onset   Breast cancer Paternal Aunt        59's   Hypertension Mother     Social History:  reports that she has quit smoking. She has never used smokeless tobacco. She reports current alcohol use of about 5.0 standard drinks per week. She reports that she does not use drugs.  ROS: Pertinent ROS in HPI  Physical Exam: BP 127/61    Pulse 72    Wt 145 lb (65.8 kg)    BMI 24.89 kg/m   Constitutional:  Well nourished. Alert and oriented, No acute distress. HEENT: [place] AT, mask in place.  Trachea midline Cardiovascular: No clubbing, cyanosis, or edema. Respiratory: Normal respiratory effort, no increased work of breathing. Neurologic: Grossly intact, no focal deficits, moving all 4 extremities. Psychiatric: Normal mood and affect.    Laboratory Data: Lab Results  Component Value Date   WBC 15.6 (H) 01/10/2021   HGB 9.5 (L) 01/10/2021   HCT 29.0 (L) 01/10/2021   MCV 92.9 01/10/2021   PLT 309 01/10/2021    Lab Results  Component Value Date   CREATININE 0.53 01/10/2021    Lab Results  Component Value Date   HGBA1C 6.4 (H) 01/09/2021    Lab Results  Component Value Date   AST 14 (L) 01/10/2021   Lab Results  Component Value Date    ALT 12 01/10/2021    Urinalysis    Component Value Date/Time   COLORURINE AMBER (A) 01/08/2021 1451   APPEARANCEUR CLOUDY (A) 01/08/2021 1451   APPEARANCEUR Clear 09/19/2013 1329   LABSPEC 1.017 01/08/2021 1451   LABSPEC 1.014 09/19/2013 1329   PHURINE 5.0 01/08/2021 1451   GLUCOSEU NEGATIVE 01/08/2021 1451   GLUCOSEU Negative 09/19/2013 1329   HGBUR MODERATE (A) 01/08/2021 1451   BILIRUBINUR NEGATIVE 01/08/2021 1451   BILIRUBINUR Negative 09/19/2013 [place] 01/08/2021 1451   PROTEINUR NEGATIVE 01/08/2021 1451  NITRITE NEGATIVE 01/08/2021 1451   LEUKOCYTESUR MODERATE (A) 01/08/2021 1451   LEUKOCYTESUR Negative 09/19/2013 1329  I have reviewed the labs.   Pertinent Imaging: N/A  Assessment & Plan:    1. Urinary retention -going forward, I discussed with the patient and her daughter the options of repeat TOV/PVR, continue with the indwelling Foley, having UDS for investigation and/or placement of SPT -at this time, they would like to pursue another TOV/PVR  -Foley is exchanged today   Return in about 1 week (around 02/14/2021) for TOV/PVR .  These notes generated with voice recognition software. I apologize for typographical errors.  Zara Council, PA-C  [facility] [address]  [place], [place] [address] [phone]   I spent 25 minutes on the day of the encounter to include pre-visit record review, face-to-face time with the patient, and post-visit ordering of tests.

## 2021-02-07 ENCOUNTER — Ambulatory Visit (INDEPENDENT_AMBULATORY_CARE_PROVIDER_SITE_OTHER): Payer: Medicare Other | Admitting: Urology

## 2021-02-07 ENCOUNTER — Other Ambulatory Visit: Payer: Self-pay

## 2021-02-07 ENCOUNTER — Encounter: Payer: Self-pay | Admitting: Urology

## 2021-02-07 VITALS — BP 127/61 | HR 72 | Wt 145.0 lb

## 2021-02-07 DIAGNOSIS — R339 Retention of urine, unspecified: Secondary | ICD-10-CM | POA: Diagnosis not present

## 2021-02-08 ENCOUNTER — Telehealth: Payer: Self-pay | Admitting: *Deleted

## 2021-02-08 NOTE — Telephone Encounter (Signed)
Daughter changing care, requesting records, records request signed. Pt only seen twice, per lisa Curator) ok to print and give hard copy to daughter.

## 2021-02-11 NOTE — Telephone Encounter (Signed)
Spoke with daughter Lattie Haw Hours to let her know one of the notes is not finished and when it's complete we will get her a copy.  Also daughter states pt is NOT changing care and that she wanted the records for her personal use. Pt's other daughter will bring pt in again for voiding trial on 02/26/2021.

## 2021-02-14 ENCOUNTER — Ambulatory Visit: Payer: Medicare Other | Admitting: Urology

## 2021-02-14 ENCOUNTER — Ambulatory Visit: Payer: Medicare Other

## 2021-02-22 ENCOUNTER — Ambulatory Visit: Payer: Medicare Other

## 2021-02-25 NOTE — Progress Notes (Signed)
02/26/2021 11:30 AM   Megan Salinas 08-26-1926 [id number]  Referring provider: Kirk Ruths, MD [place] [facility] [place],  [place] [address]  Chief Complaint  Patient presents with   Urinary Retention   Urological history: 1. Urinary retention -managed with indwelling Foley at this time  HPI: Megan Salinas is a 88 y.o. female who presents today for follow up after Foley was removed this morning with her daughter, Megan Salinas.  Her catheter removed by [place] on January 02, 2021.  At some point the catheter was replaced as she presented to the emergency department on December 27 for the complaint of elevated white blood count and blood in stool and urine.  She was admitted for colitis.  A CT scan conducted during that admission noted that the mild bilateral hydronephrosis has significantly decreased compared to the CT on 12/21/2020.    Foley catheter was removed this morning, but she is only urinated very small amount.  Her bladder scan today in the office is 323 mL.    PMH: Past Medical History:  Diagnosis Date   Back pain    CAD (coronary artery disease)    CHF (congestive heart failure) (HCC)    GERD (gastroesophageal reflux disease)    High cholesterol    Hypertension    TIA (transient ischemic attack) 01/2016   "possible", no deficits   Wears hearing aid    bilateral    Surgical History: Past Surgical History:  Procedure Laterality Date   ABDOMINAL HYSTERECTOMY  1998   APPENDECTOMY     baldder tac  1995   BREAST CYST ASPIRATION Right    CARPAL TUNNEL RELEASE Right 03/26/2016   Procedure: CARPAL TUNNEL RELEASE ENDOSCOPIC  right;  Surgeon: Corky Mull, MD;  Location: [place];  Service: Orthopedics;  Laterality: Right;  bier block   CHOLECYSTECTOMY     CORONARY ARTERY BYPASS GRAFT  02/15/2010   [facility], 3 vessel   HIP ARTHROPLASTY Right 11/29/2020   Procedure: ARTHROPLASTY BIPOLAR HIP (HEMIARTHROPLASTY) REMOVAL OF  HARDWARE RIGHT FEMUR;  Surgeon: Altamese [place], MD;  Location: [place];  Service: Orthopedics;  Laterality: Right;   INTRAMEDULLARY (IM) NAIL INTERTROCHANTERIC Left 04/11/2019   Procedure: INTRAMEDULLARY (IM) NAIL INTERTROCHANTRIC;  Surgeon: Leim Fabry, MD;  Location: [facility];  Service: Orthopedics;  Laterality: Left;   TONSILLECTOMY      Home Medications:  Allergies as of 02/26/2021       Reactions   Dust Mite Extract Other (See Comments)   Runny nose   Sulfa Antibiotics Itching, Rash   Red, itchy palms        Medication List        Accurate as of February 26, 2021 11:59 PM. If you have any questions, ask your nurse or doctor.          acetaminophen 500 MG tablet Commonly known as: TYLENOL Take 500 mg by mouth every 6 (six) hours as needed for mild pain.   alum & mag hydroxide-simeth 200-200-20 MG/5ML suspension Commonly known as: MAALOX/MYLANTA Take 30 mLs by mouth every 4 (four) hours as needed for indigestion or heartburn.   amitriptyline 25 MG tablet Commonly known as: ELAVIL Take 25 mg by mouth at bedtime.   aspirin EC 81 MG tablet Take 81 mg by mouth daily.   bisacodyl 10 MG suppository Commonly known as: DULCOLAX Place 10 mg rectally as needed for moderate constipation.   Dermacloud Oint Apply 1 application topically daily as  needed.   dicyclomine 20 MG tablet Commonly known as: BENTYL Take 20 mg by mouth every 6 (six) hours.   famotidine 40 MG tablet Commonly known as: PEPCID Take 40 mg by mouth at bedtime as needed for heartburn.   lisinopril 20 MG tablet Commonly known as: ZESTRIL Take 20 mg by mouth daily.   metoprolol tartrate 25 MG tablet Commonly known as: LOPRESSOR Take 12.5 mg by mouth 2 (two) times daily.   Multi-Vitamins Tabs Take 1 tablet by mouth daily.   nystatin cream Commonly known as: MYCOSTATIN 1 application daily as needed for dry skin.   ondansetron 4 MG tablet Commonly known as: ZOFRAN Take 4 mg by mouth every 8  (eight) hours as needed for nausea or vomiting.   polyethylene glycol 17 g packet Commonly known as: MIRALAX / GLYCOLAX Take 17 g by mouth daily.   psyllium 0.52 g capsule Commonly known as: REGULOID Take 1.04 g by mouth daily. 2 capsules daily   simvastatin 40 MG tablet Commonly known as: ZOCOR Take 40 mg by mouth daily.   spironolactone 25 MG tablet Commonly known as: ALDACTONE Take 12.5 mg by mouth daily.   Vitamin D 125 MCG (5000 UT) Caps Take 1 capsule by mouth daily.        Allergies:  Allergies  Allergen Reactions   Dust Mite Extract Other (See Comments)    Runny nose   Sulfa Antibiotics Itching and Rash    Red, itchy palms    Family History: Family History  Problem Relation Age of Onset   Breast cancer Paternal Aunt        23's   Hypertension Mother     Social History:  reports that she has quit smoking. She has never used smokeless tobacco. She reports current alcohol use of about 5.0 standard drinks per week. She reports that she does not use drugs.  ROS: Pertinent ROS in HPI  Physical Exam: Constitutional:  Well nourished. Alert and oriented, No acute distress. HEENT: [place] AT, mask in place.  Trachea midline Cardiovascular: No clubbing, cyanosis, or edema. Respiratory: Normal respiratory effort, no increased work of breathing. GU: No CVA tenderness.  No bladder fullness or masses.  Atrophic external genitalia, normal pubic hair distribution, no lesions.  Normal urethral meatus, no lesions, no prolapse, no discharge.   No urethral masses, tenderness and/or tenderness.  No bladder fullness, tenderness or masses. Pale vagina mucosa, poor estrogen effect, no discharge, no lesions, fair pelvic support, grade I cystocele and no rectocele noted.  Narrowed vaginal introitus.  Anus and perineum are without rashes or lesions.     Neurologic: Grossly intact, no focal deficits, moving all 4 extremities. Psychiatric: Normal mood and affect.    Laboratory  Data: N/A   Pertinent Imaging:  02/26/21 13:51  Scan Result 351mL    Simple Catheter Placement Due to urinary retention patient is present today for a foley cath placement.  Patient was cleaned and prepped in a sterile fashion with betadine. A 16 FR foley catheter was inserted, urine return was noted  60 ml, urine was yellow clear in color.  The balloon was filled with 10cc of sterile water.  A night bag was attached for drainage.  Patient was given instruction on proper catheter care.  Patient tolerated well, no complications were noted   Performed by: Zara Council, PA-C   Assessment & Plan:    1. Urinary retention -At this time they would like to continue with the indwelling Foley and in 30  days having another attempt at trial of void as she is not very mobile at this time at her facility.  We have encouraged her to increase her ambulation with staff up to 3 times daily in an effort to get her bowels and bladder working  Return in about 1 month (around 03/26/2021) for TOV and PVR .  These notes generated with voice recognition software. I apologize for typographical errors.  Zara Council, PA-C  [facility] [address]  [place], [place] [address] [phone]   I spent 25 minutes on the day of the encounter to include pre-visit record review, face-to-face time with the patient encouraging the patient to ambulate and listening to her concerns regarding an indwelling Foley, and post-visit ordering of tests.

## 2021-02-26 ENCOUNTER — Ambulatory Visit (INDEPENDENT_AMBULATORY_CARE_PROVIDER_SITE_OTHER): Payer: Medicare Other | Admitting: Urology

## 2021-02-26 ENCOUNTER — Other Ambulatory Visit: Payer: Self-pay

## 2021-02-26 ENCOUNTER — Ambulatory Visit (INDEPENDENT_AMBULATORY_CARE_PROVIDER_SITE_OTHER): Payer: Medicare Other | Admitting: Family Medicine

## 2021-02-26 DIAGNOSIS — R339 Retention of urine, unspecified: Secondary | ICD-10-CM | POA: Diagnosis not present

## 2021-02-26 LAB — BLADDER SCAN AMB NON-IMAGING

## 2021-02-26 NOTE — Progress Notes (Signed)
Catheter Removal  Patient is present today for a catheter removal.  49ml of water was drained from the balloon. A 16FR foley cath was removed from the bladder no complications were noted . Patient tolerated well.  Performed by: Elberta Leatherwood CMA  Follow up/ Additional notes: This afternoon for PVR

## 2021-02-27 ENCOUNTER — Encounter: Payer: Self-pay | Admitting: Urology

## 2021-02-28 ENCOUNTER — Ambulatory Visit: Payer: Medicare Other | Admitting: Urology

## 2021-03-27 ENCOUNTER — Ambulatory Visit (INDEPENDENT_AMBULATORY_CARE_PROVIDER_SITE_OTHER): Payer: Medicare Other | Admitting: Urology

## 2021-03-27 ENCOUNTER — Other Ambulatory Visit: Payer: Self-pay

## 2021-03-27 ENCOUNTER — Ambulatory Visit: Payer: Medicare Other | Admitting: Urology

## 2021-03-27 DIAGNOSIS — R339 Retention of urine, unspecified: Secondary | ICD-10-CM

## 2021-03-27 LAB — BLADDER SCAN AMB NON-IMAGING

## 2021-03-27 NOTE — Progress Notes (Signed)
Catheter Removal  Patient is present today for a catheter removal.  8ml of water was drained from the balloon. A 16FR foley cath was removed from the bladder no complications were noted . Patient tolerated well.  Performed by: Crysta Albright CMA  Follow up/ Additional notes: RTC this afternoon for PVR.  

## 2021-03-28 ENCOUNTER — Ambulatory Visit: Payer: Medicare Other | Admitting: Urology

## 2021-04-01 ENCOUNTER — Non-Acute Institutional Stay: Payer: Medicare Other | Admitting: Student

## 2021-04-01 ENCOUNTER — Other Ambulatory Visit: Payer: Self-pay

## 2021-04-01 DIAGNOSIS — R682 Dry mouth, unspecified: Secondary | ICD-10-CM

## 2021-04-01 DIAGNOSIS — R339 Retention of urine, unspecified: Secondary | ICD-10-CM

## 2021-04-01 DIAGNOSIS — K5900 Constipation, unspecified: Secondary | ICD-10-CM

## 2021-04-01 DIAGNOSIS — Z515 Encounter for palliative care: Secondary | ICD-10-CM

## 2021-04-01 DIAGNOSIS — R630 Anorexia: Secondary | ICD-10-CM

## 2021-04-01 DIAGNOSIS — R531 Weakness: Secondary | ICD-10-CM

## 2021-04-01 NOTE — Progress Notes (Signed)
? ? ?Manufacturing engineer ?Community Palliative Care Consult Note ?Telephone: [phone]  ?Fax: [phone]  ? ?Date of encounter: 04/01/21 ?12:03 PM ?PATIENT NAME: [place] ?[place] [address]   ?[phone] (home)  ?DOB: 10/10/1926 ?MRN: [id number] ?PRIMARY CARE PROVIDER:    ?Megan Ruths, MD,  ?Megan Salinas ?[place] [address] ?[id number] ? ?REFERRING PROVIDER:   ?Megan Ruths, MD ?[facility] [place],  [place] [address] ?[phone] ? ?RESPONSIBLE PARTY:    ?Contact Information   ? ? Name Relation Home Work Mobile  ? Megan Salinas [phone]    ? Megan Salinas [place] [phone]    ? ?  ? ? ? ?I met face to face with patient in the facility. Palliative Care was asked to follow this patient by consultation request of  Megan Ruths, MD to address advance care planning and complex medical decision making. This is the initial visit.  ? ? ?                                 ASSESSMENT AND PLAN / RECOMMENDATIONS:  ? ?Advance Care Planning/Goals of Care: Goals include to maximize quality of life and symptom management. Patient/health care surrogate gave his/her permission to discuss.Our advance care planning conversation included a discussion about:    ?The value and importance of advance care planning  ?Experiences with loved ones who have been seriously ill or have died  ?Exploration of personal, cultural or spiritual beliefs that might influence medical decisions  ?Exploration of goals of care in the event of a sudden injury or illness  ?Identification  of a healthcare agent  ?CODE STATUS: Full Code  ? ?Education provided on Palliative Medicine. Patient plans to continue therapy; she will discharge to an AL facility in [place], near her [place] early April. Palliative medicine will continue to provide symptom management and supportive care. Discussed code status; patient  wishes to continue Full Code. She does not have a HCPOA in place but states she is working on this.  ? ?Symptom Management/Plan: ? ?Generalized weakness-s/p closed right hip fracture 11/2020. Patient to continue physical and occupational therapy as directed. Use walker for ambulation, w/c for locomotion when going longer distances. Monitor for falls/safety.  ? ?Decline in appetite-patient endorses fair appetite; she states she does not like the way the food is prepared. She is encouraged to eat foods she enjoys, continue Glucerna. Routine weights per facility.  ? ?Constipation-continue prune juice, miralax and metamucil as directed.  ? ?Urinary retention-patient continues with indwelling foley catheter; follow up with urology as scheduled.  ? ?Dry mouth-patient endorses dry mouth; medications reviewed. She rarely takes dicyclomine, she does take Lexapro, which is newer for her for her anxiety. Recommend good oral hygiene, start biotene rinse 15 ml, swish for 30 seconds, spit out TID. ? ?Follow up Palliative Care Visit: Palliative care will continue to follow for complex medical decision making, advance care planning, and clarification of goals. Return in 4 weeks or prn. ? ? ?This visit was coded based on medical decision making (MDM). ? ?PPS: 40% ? ?HOSPICE ELIGIBILITY/DIAGNOSIS: TBD ? ?Chief Complaint: Palliative Medicien  ? ?HISTORY OF PRESENT ILLNESS:  Megan Salinas is a 88 y.o. year old female  with chronic systolic heart failure, ASCVD, T2DM, protein calorie malnutrition, cerebral infarction, dysphasia, hypertension, hyperlipidemia, anxiety, urinary retention. Hx of right hip  fracture with repair 11/2020.  ? ?Currently resides at [facility]. Patient previously resided in [place]. She will be moving to an AL in [place] early April, near [place]. Patient endorses generalized weakness; she is receiving PT and OT services. She denies pain, shortness of breath, nausea. She does endorse occasional  constipation.  ?Endorses a dry mouth since surgery. Fair appetite reported; she does not like how the food is prepared. Endorses stable mood; she is sleeping well at night. ?  ?History obtained from review of EMR, discussion with primary team, and interview with family, facility staff/caregiver and/or Megan [place].  ?I reviewed available labs, medications, imaging, studies and related documents from the EMR.  Records reviewed and summarized above.  ? ? ?Physical Exam: ?Pulse 66, resp 16, sats 97% on room air  ?Constitutional: NAD ?General: frail appearing, thin  ?EYES: anicteric sclera, lids intact, no discharge  ?ENMT: intact hearing, oral mucous membranes moist, dentition intact, no oral lesions ?CV: S1S2, RRR, no LE edema ?Pulmonary: LCTA, no increased work of breathing, no cough, room air ?Abdomen: normo-active BS + 4 quadrants, soft and non tender, no ascites ?GU: deferred ?MSK: all extremities, ambulatory ?Skin: warm and dry, no rashes or wounds on visible skin ?Neuro: +generalized weakness ?Psych: non-anxious affect, A and O x 3 ?Hem/lymph/immuno: no widespread bruising ?CURRENT PROBLEM LIST:  ?Patient Active Problem List  ? Diagnosis Date Noted  ? Acute cystitis without hematuria   ? Foley catheter in place   ? Colitis 01/08/2021  ? Abdominal pain 01/08/2021  ? Thyroid incidentaloma 11/29/2020  ? Closed right hip fracture, initial encounter ([place]) 11/28/2020  ? Lower limb ulcer, ankle, right, limited to breakdown of skin ([place]) 11/13/2020  ? Pain in limb 11/13/2020  ? Wears hearing aid in both ears 03/19/2020  ? Hemotympanum, left 10/04/2019  ? Sensorineural hearing loss of both ears 10/04/2019  ? Moderate aortic valve insufficiency 08/24/2019  ? Mild aortic stenosis 08/24/2019  ? Age-related osteoporosis with current pathological fracture with routine healing 04/18/2019  ? Displaced intertrochanteric fracture of left femur ([place]) 04/10/2019  ? Type 2 diabetes mellitus without complication ([place]) 99/37/1696  ?  Preop examination 04/10/2019  ? Lymphedema 09/05/2018  ? Primary osteoarthritis of right wrist 02/22/2018  ? Chest pain 02/28/2017  ? Venous insufficiency of right lower extremity 09/02/2016  ? Swelling of limb 08/15/2016  ? Frequent PVCs 03/12/2016  ? Transient global amnesia 02/26/2016  ? TIA (transient ischemic attack) 01/28/2016  ? Carpal tunnel syndrome, right 08/13/2015  ? DDD (degenerative disc disease), lumbar 09/12/2014  ? Lumbar radiculitis 09/12/2014  ? Lumbar stenosis with neurogenic claudication 09/12/2014  ? GERD (gastroesophageal reflux disease) 09/08/2014  ? History of type 2 diabetes mellitus 09/08/2014  ? Benign essential hypertension 06/16/2014  ? Moderate tricuspid insufficiency 06/07/2014  ? Abdominal pain, diffuse 03/09/2014  ? Chronic diastolic CHF (congestive heart failure) ([place]) 12/05/2013  ? Moderate mitral insufficiency 12/05/2013  ? Coronary artery disease 03/29/2013  ? Mixed hyperlipidemia 03/29/2013  ? ?PAST MEDICAL HISTORY:  ?Active Ambulatory Problems  ?  Diagnosis Date Noted  ? Abdominal pain, diffuse 03/09/2014  ? Benign essential hypertension 06/16/2014  ? Carpal tunnel syndrome, right 08/13/2015  ? Chronic diastolic CHF (congestive heart failure) ([place]) 12/05/2013  ? Coronary artery disease 03/29/2013  ? DDD (degenerative disc disease), lumbar 09/12/2014  ? GERD (gastroesophageal reflux disease) 09/08/2014  ? History of type 2 diabetes mellitus 09/08/2014  ? Lumbar radiculitis 09/12/2014  ? Lumbar stenosis with neurogenic claudication 09/12/2014  ?  Mixed hyperlipidemia 03/29/2013  ? Moderate mitral insufficiency 12/05/2013  ? Moderate tricuspid insufficiency 06/07/2014  ? TIA (transient ischemic attack) 01/28/2016  ? Swelling of limb 08/15/2016  ? Venous insufficiency of right lower extremity 09/02/2016  ? Chest pain 02/28/2017  ? Frequent PVCs 03/12/2016  ? Primary osteoarthritis of right wrist 02/22/2018  ? Transient global amnesia 02/26/2016  ? Lymphedema 09/05/2018  ? Displaced  intertrochanteric fracture of left femur ([place]) 04/10/2019  ? Type 2 diabetes mellitus without complication ([place]) 84/53/6468  ? Preop examination 04/10/2019  ? Age-related osteoporosis with current pathologi

## 2021-04-05 ENCOUNTER — Encounter: Payer: Self-pay | Admitting: Urology

## 2021-04-05 NOTE — Progress Notes (Signed)
? ? ?03/27/2021 ?8:16 AM  ? ?Megan Salinas ?1926-10-17 ?[id number] ? ?Referring provider: Kirk Ruths, MD ?[place],  [place] [address] ? ?Chief Complaint  ?Patient presents with  ? Urinary Retention  ? ?Urological history: ?1. Urinary retention ?-managed with indwelling Foley at this time ? ?HPI: ?Megan Salinas is a 88 y.o. female who presents today for follow up after Foley was removed this morning with her daughter, Megan Salinas. ? ?She returns to find a PVR of 332 mL. ? ?PMH: ?Past Medical History:  ?Diagnosis Date  ? Back pain   ? CAD (coronary artery disease)   ? CHF (congestive heart failure) ([place])   ? GERD (gastroesophageal reflux disease)   ? High cholesterol   ? Hypertension   ? TIA (transient ischemic attack) 01/2016  ? "possible", no deficits  ? Wears hearing aid   ? bilateral  ? ? ?Surgical History: ?Past Surgical History:  ?Procedure Laterality Date  ? ABDOMINAL HYSTERECTOMY  1998  ? APPENDECTOMY    ? baldder tac  1995  ? BREAST CYST ASPIRATION Right   ? CARPAL TUNNEL RELEASE Right 03/26/2016  ? Procedure: CARPAL TUNNEL RELEASE ENDOSCOPIC  right;  Surgeon: Corky Mull, MD;  Location: [facility];  Service: Orthopedics;  Laterality: Right;  bier block  ? CHOLECYSTECTOMY    ? CORONARY ARTERY BYPASS GRAFT  02/15/2010  ? [facility], 3 vessel  ? HIP ARTHROPLASTY Right 11/29/2020  ? Procedure: ARTHROPLASTY BIPOLAR HIP (HEMIARTHROPLASTY) REMOVAL OF HARDWARE RIGHT FEMUR;  Surgeon: Altamese [place], MD;  Location: [facility];  Service: Orthopedics;  Laterality: Right;  ? INTRAMEDULLARY (IM) NAIL INTERTROCHANTERIC Left 04/11/2019  ? Procedure: INTRAMEDULLARY (IM) NAIL INTERTROCHANTRIC;  Surgeon: Leim Fabry, MD;  Location: [facility];  Service: Orthopedics;  Laterality: Left;  ? TONSILLECTOMY    ? ? ?Home Medications:  ?Allergies as of 03/27/2021   ? ?   Reactions  ? Dust Mite Extract Other (See Comments)  ? Runny nose  ? Sulfa Antibiotics Itching, Rash  ? Red, itchy palms   ? ?  ? ?  ?Medication List  ?  ? ?  ? Accurate as of March 27, 2021 11:59 PM. If you have any questions, ask your nurse or doctor.  ?  ?  ? ?  ? ?acetaminophen 500 MG tablet ?Commonly known as: TYLENOL ?Take 500 mg by mouth every 6 (six) hours as needed for mild pain. ?  ?alum & mag hydroxide-simeth 200-200-20 MG/5ML suspension ?Commonly known as: MAALOX/MYLANTA ?Take 30 mLs by mouth every 4 (four) hours as needed for indigestion or heartburn. ?  ?amitriptyline 25 MG tablet ?Commonly known as: ELAVIL ?Take 25 mg by mouth at bedtime. ?  ?aspirin EC 81 MG tablet ?Take 81 mg by mouth daily. ?  ?bisacodyl 10 MG suppository ?Commonly known as: DULCOLAX ?Place 10 mg rectally as needed for moderate constipation. ?  ?Dermacloud Oint ?Apply 1 application topically daily as needed. ?  ?dicyclomine 20 MG tablet ?Commonly known as: BENTYL ?Take 20 mg by mouth every 6 (six) hours. ?  ?famotidine 40 MG tablet ?Commonly known as: PEPCID ?Take 40 mg by mouth at bedtime as needed for heartburn. ?  ?lisinopril 20 MG tablet ?Commonly known as: ZESTRIL ?Take 20 mg by mouth daily. ?  ?metoprolol tartrate 25 MG tablet ?Commonly known as: LOPRESSOR ?Take 12.5 mg by mouth 2 (two) times daily. ?  ?Multi-Vitamins Tabs ?Take 1 tablet by mouth daily. ?  ?nystatin cream ?  Commonly known as: MYCOSTATIN ?1 application daily as needed for dry skin. ?  ?ondansetron 4 MG tablet ?Commonly known as: ZOFRAN ?Take 4 mg by mouth every 8 (eight) hours as needed for nausea or vomiting. ?  ?polyethylene glycol 17 g packet ?Commonly known as: MIRALAX / GLYCOLAX ?Take 17 g by mouth daily. ?  ?psyllium 0.52 g capsule ?Commonly known as: REGULOID ?Take 1.04 g by mouth daily. 2 capsules daily ?  ?simvastatin 40 MG tablet ?Commonly known as: ZOCOR ?Take 40 mg by mouth daily. ?  ?spironolactone 25 MG tablet ?Commonly known as: ALDACTONE ?Take 12.5 mg by mouth daily. ?  ?Vitamin D 125 MCG (5000 UT) Caps ?Take 1 capsule by mouth daily. ?  ? ?  ? ? ?Allergies:   ?Allergies  ?Allergen Reactions  ? Dust Mite Extract Other (See Comments)  ?  Runny nose  ? Sulfa Antibiotics Itching and Rash  ?  Red, itchy palms  ? ? ?Family History: ?Family History  ?Problem Relation Age of Onset  ? Breast cancer Paternal Aunt   ?     36's  ? Hypertension Mother   ? ? ?Social History:  reports that she has quit smoking. She has never used smokeless tobacco. She reports current alcohol use of about 5.0 standard drinks per week. She reports that she does not use drugs. ? ?ROS: ?Pertinent ROS in HPI ? ?Physical Exam: ?Constitutional:  Well nourished. Alert and oriented, No acute distress. ?HEENT: [place] AT, mask in place.  Trachea midline ?Cardiovascular: No clubbing, cyanosis, or edema. ?Respiratory: Normal respiratory effort, no increased work of breathing. ?Neurologic: Grossly intact, no focal deficits, moving all 4 extremities. ?Psychiatric: Normal mood and affect.   ? ?Laboratory Data: ?N/A ? ? ?Pertinent Imaging: ? 03/27/21 16:36  ?Scan Result 377m  ? ?Simple Catheter Placement ? ?Due to urinary retention patient is present today for a foley cath placement.  Patient was cleaned and prepped in a sterile fashion with betadine. A 16 FR foley catheter was inserted, urine return was noted  400 ml, urine was yellow in color.  The balloon was filled with 10cc of sterile water.  A night bag was attached for drainage.  Patient was given instruction on proper catheter care.  Patient tolerated well, no complications were noted  ? ? ?Assessment & Plan:   ? ?1. Urinary retention ?-patient is in the process of [place] out of [place] nearer to her daughter ?-they will transfer her care to a local urologist ?-we are available to exchange the Foley in 30 days or for emergency care ? ?Return in about 1 month (around 04/27/2021) for Foley exchange . ? ?These notes generated with voice recognition software. I apologize for typographical errors. ? ?Jonn Chaikin, PA-C ? ?[facility] [place] [address]?[phone]?  ?

## 2021-04-23 NOTE — Progress Notes (Signed)
Cath Change/ Replacement ? ?Patient is present today for a catheter change due to urinary retention.  9 ml of water was removed from the balloon, a 16 FR foley cath was removed with out difficulty.  Patient was cleaned and prepped in a sterile fashion with betadine. A 16 FR foley cath was replaced into the bladder no complications were noted Urine return was noted 20 ml and urine was yellow clear in color. The balloon was filled with 37m of sterile water. A night bag was attached for drainage.  Patient was given proper instruction on catheter care.   ? ?Performed by: SZara Council PA-C  ? ?Follow up: [place]  ?

## 2021-04-24 ENCOUNTER — Ambulatory Visit (INDEPENDENT_AMBULATORY_CARE_PROVIDER_SITE_OTHER): Payer: Medicare Other | Admitting: Urology

## 2021-04-24 DIAGNOSIS — R339 Retention of urine, unspecified: Secondary | ICD-10-CM | POA: Diagnosis not present

## 2021-06-04 ENCOUNTER — Telehealth (INDEPENDENT_AMBULATORY_CARE_PROVIDER_SITE_OTHER): Payer: Self-pay

## 2021-06-04 NOTE — Telephone Encounter (Signed)
I attempted to return a call to the patient as she left a message stating she has moved to near her daughter in [place] and will not be coming her and wanted to know about her ultrasound she had last year. I was unable to leave a message as her voicemail box is not set up. Per Eulogio Ditch NP I can send the patient a copy of her ultrasound.

## 2021-11-11 ENCOUNTER — Encounter (INDEPENDENT_AMBULATORY_CARE_PROVIDER_SITE_OTHER): Payer: Self-pay

## 2022-11-13 IMAGING — CT CT ABD-PELV W/ CM
2 of 5 series · 16 of 46 positions shown, 18 images · IV contrast (APPLIED)
Comparison: CT 12/21/2020, 01/20/2018

CLINICAL DATA: Abdomen pain blood in stool

EXAM:
CT ABDOMEN AND PELVIS WITH CONTRAST
TECHNIQUE: Multidetector CT imaging of the abdomen and pelvis was performed
using the standard protocol following bolus administration of
intravenous contrast.
CONTRAST:  100mL OMNIPAQUE IOHEXOL 300 MG/ML  SOLN

[Series 2: routine abd/pel with · axial · 0.88mm/px · z∈[-404,-49]mm · 13 of 81 slices shown, 15 images]
[im 5/81  soft-tissue]
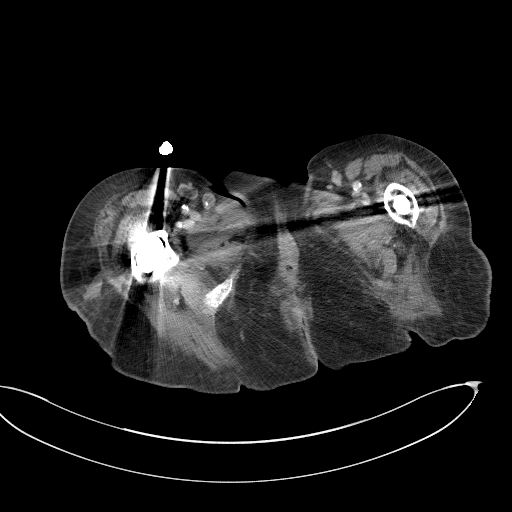
[im 5/81  bone]
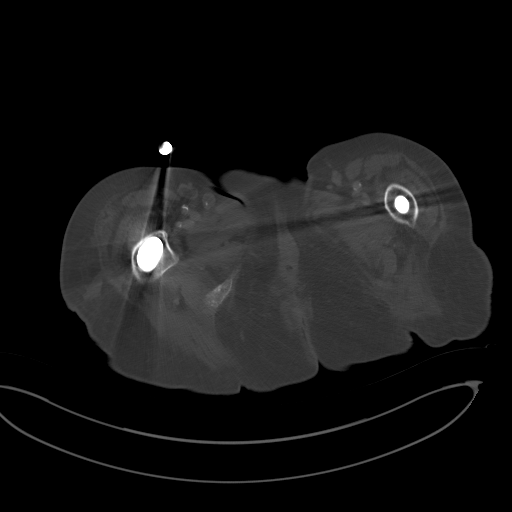
[im 10/81  soft-tissue]
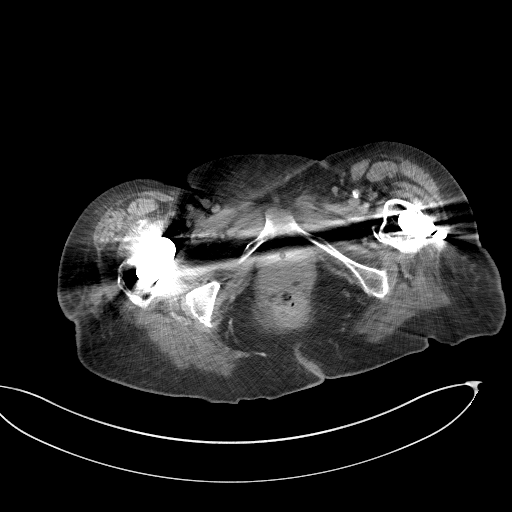
[im 15/81  soft-tissue]
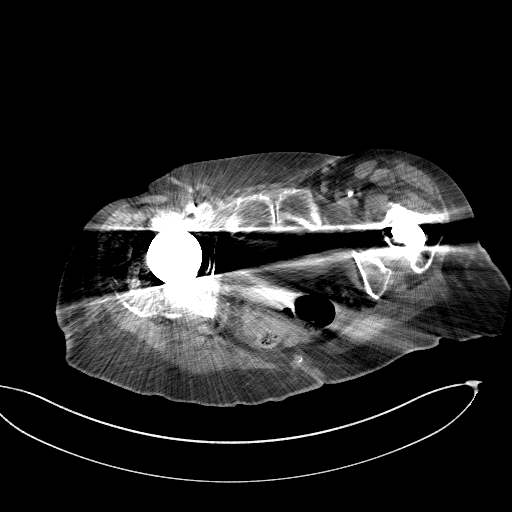
[im 29/81  soft-tissue]
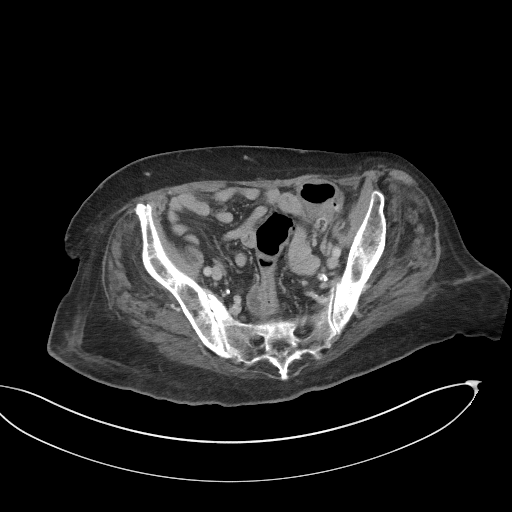
[im 33/81  soft-tissue]
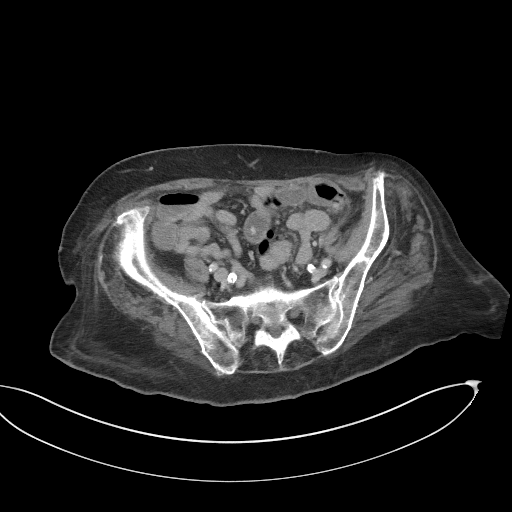
[im 38/81  soft-tissue]
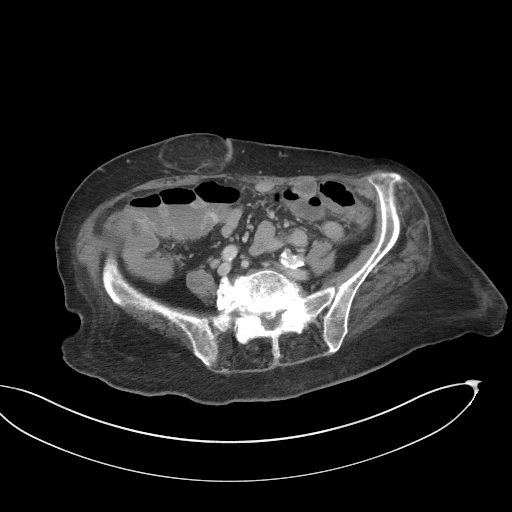
[im 43/81  soft-tissue]
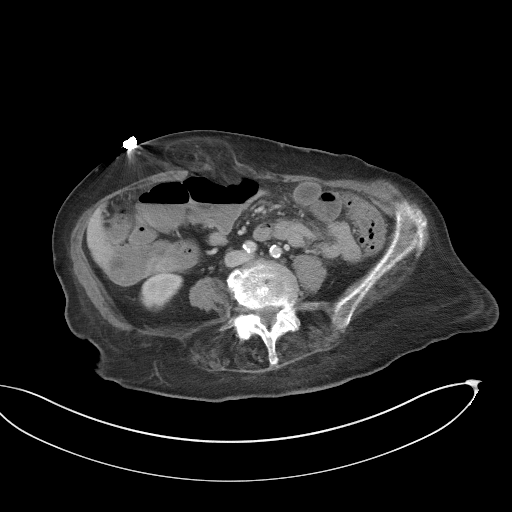
[im 48/81  soft-tissue]
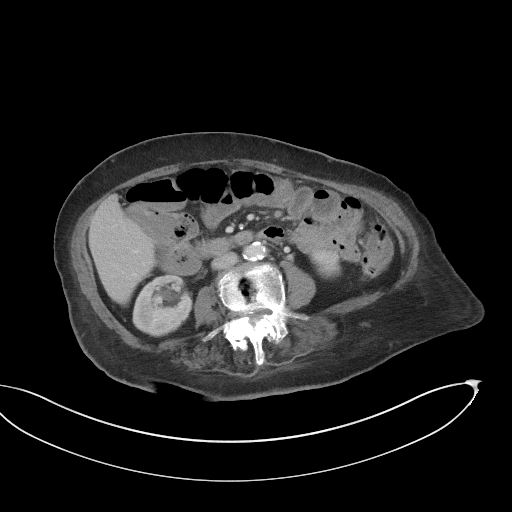
[im 52/81  soft-tissue]
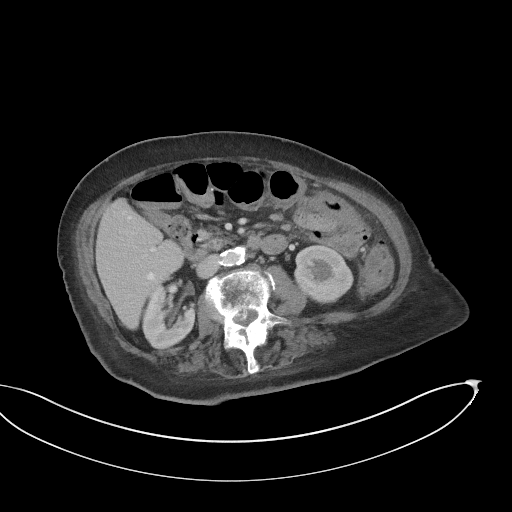
[im 52/81  bone]
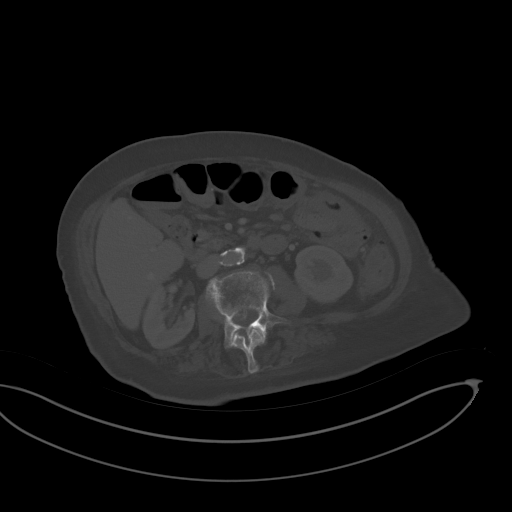
[im 57/81  soft-tissue]
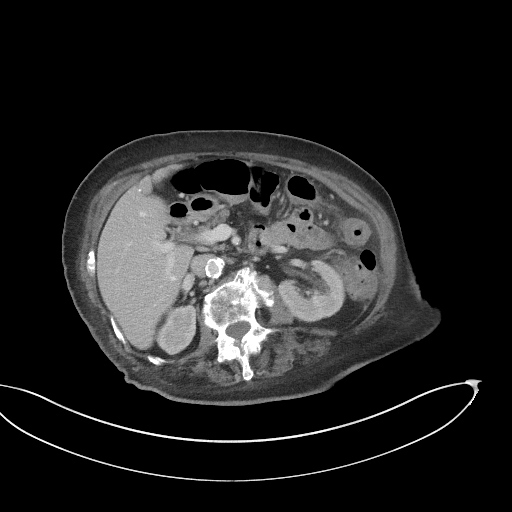
[im 66/81  soft-tissue]
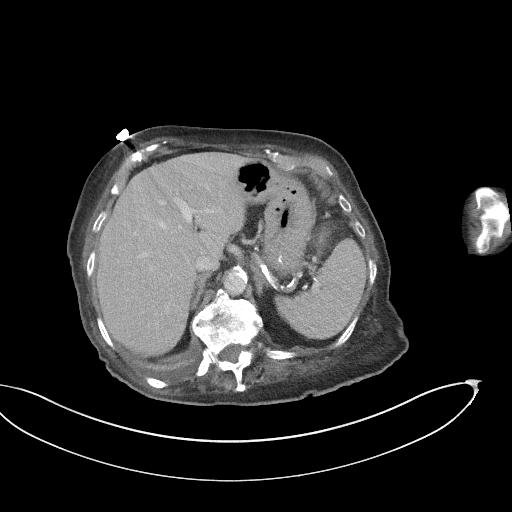
[im 71/81  soft-tissue]
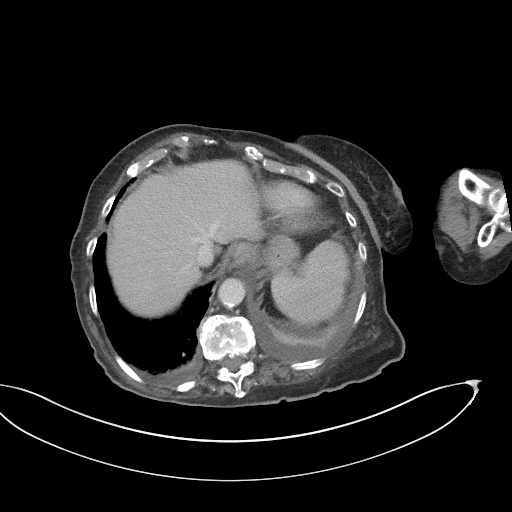
[im 76/81  soft-tissue]
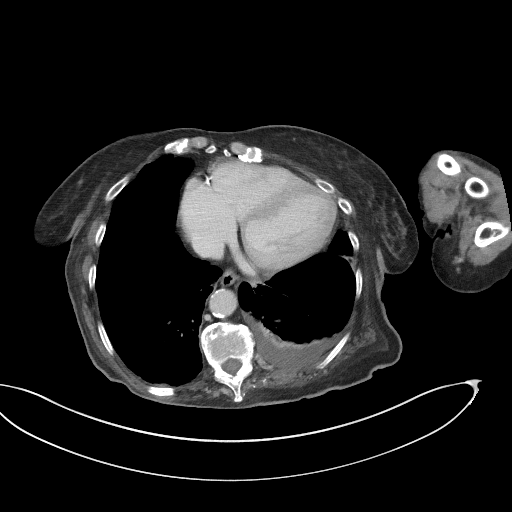

[Series 5: coronal st · coronal · 0.75mm/px · 3 of 82 slices shown]
[im 28/82  soft-tissue]
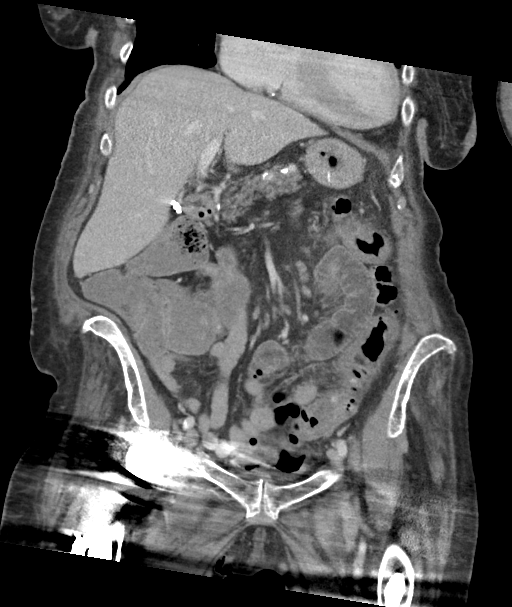
[im 37/82  soft-tissue]
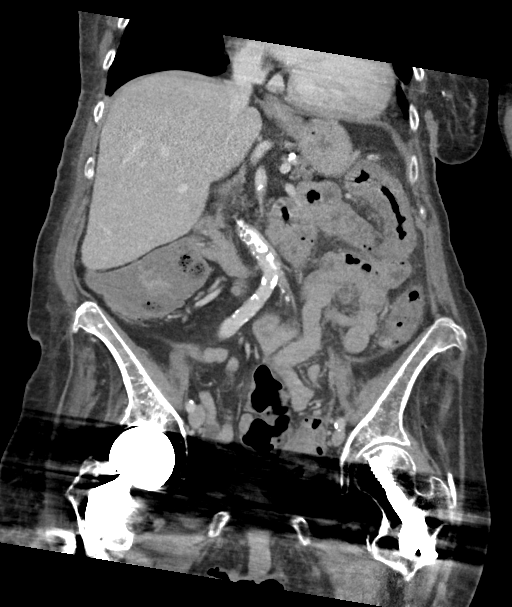
[im 46/82  soft-tissue]
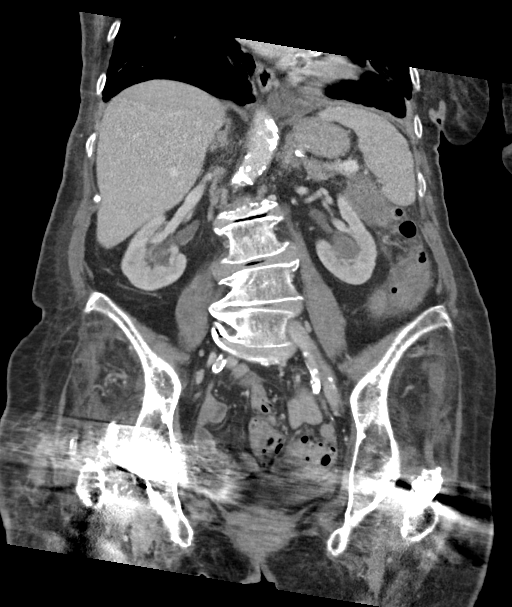

[16 of 46 positions shown; findings below may reference images not displayed]

FINDINGS: Lower chest: Lung bases demonstrate small bilateral pleural
effusions with passive atelectasis. Cardiomegaly with coronary
vascular calcification.

Hepatobiliary: No focal liver abnormality is seen. Status post
cholecystectomy. No biliary dilatation.

Pancreas: Unremarkable. No pancreatic ductal dilatation or
surrounding inflammatory changes.

Spleen: Normal in size without focal abnormality.

Adrenals/Urinary Tract: Adrenal glands are normal. Mild bilateral
hydronephrosis, significantly decreased compared to prior. The
bladder contains a Foley catheter but is largely obscured by
artifact. Delayed excretion of contrast from the ladder consistent
with decreased renal function.

Stomach/Bowel: The stomach is nonenlarged. No dilated small bowel.
Liquid stool in the transverse and right colon. Wall thickening with
inflammation at the distal transverse colon, splenic flexure and
descending colon. Sigmoid colon diverticula without definite acute
wall thickening.

Vascular/Lymphatic: Advanced aortic atherosclerosis. No aneurysm. No
suspicious lymph nodes.

Reproductive: Status post hysterectomy. No adnexal masses.

Other: Negative for pelvic effusion or free air. Moderate fat
containing periumbilical ventral hernia.

Musculoskeletal: Right hip replacement and intramedullary rodding of
left femur with artifact. Advanced degenerative changes involving
the lumbar spine at multiple levels.
IMPRESSION: 1. Wall thickening of the distal transverse colon, splenic flexure
and descending colon felt consistent with colitis of infectious,
inflammatory, or ischemic etiology. Negative for perforation or
abscess.
2. Extensive sigmoid colon diverticular disease but without acute
wall thickening here.
3. Moderate fat containing periumbilical ventral hernia
4. Small bilateral effusions with passive atelectasis at the bases
5. Minimal bilateral hydronephrosis but overall decreased compared
to the CT earlier this month.

## 2023-09-14 DEATH — deceased
